# Patient Record
Sex: Male | Born: 1940 | Race: White | Hispanic: No | Smoking: Never smoker
Health system: Southern US, Community
[De-identification: ages and names within clinical notes are randomized; demographics above are authoritative.]

## PROBLEM LIST (undated history)

## (undated) DIAGNOSIS — Z9889 Other specified postprocedural states: Secondary | ICD-10-CM

## (undated) DIAGNOSIS — T8859XA Other complications of anesthesia, initial encounter: Secondary | ICD-10-CM

## (undated) DIAGNOSIS — M545 Low back pain, unspecified: Secondary | ICD-10-CM

## (undated) DIAGNOSIS — M1990 Unspecified osteoarthritis, unspecified site: Secondary | ICD-10-CM

## (undated) DIAGNOSIS — G8929 Other chronic pain: Secondary | ICD-10-CM

## (undated) DIAGNOSIS — E785 Hyperlipidemia, unspecified: Secondary | ICD-10-CM

## (undated) DIAGNOSIS — C4A30 Merkel cell carcinoma of unspecified part of face: Secondary | ICD-10-CM

## (undated) DIAGNOSIS — T4145XA Adverse effect of unspecified anesthetic, initial encounter: Secondary | ICD-10-CM

## (undated) DIAGNOSIS — R112 Nausea with vomiting, unspecified: Secondary | ICD-10-CM

## (undated) DIAGNOSIS — Z952 Presence of prosthetic heart valve: Secondary | ICD-10-CM

## (undated) DIAGNOSIS — I10 Essential (primary) hypertension: Secondary | ICD-10-CM

## (undated) DIAGNOSIS — E279 Disorder of adrenal gland, unspecified: Secondary | ICD-10-CM

## (undated) DIAGNOSIS — K219 Gastro-esophageal reflux disease without esophagitis: Secondary | ICD-10-CM

## (undated) DIAGNOSIS — C801 Malignant (primary) neoplasm, unspecified: Secondary | ICD-10-CM

## (undated) HISTORY — PX: VASECTOMY: SHX75

## (undated) HISTORY — DX: Low back pain, unspecified: M54.50

## (undated) HISTORY — DX: Other chronic pain: G89.29

## (undated) HISTORY — PX: TONSILLECTOMY: SUR1361

## (undated) HISTORY — DX: Low back pain: M54.5

## (undated) HISTORY — PX: ESOPHAGOGASTRODUODENOSCOPY: SHX1529

## (undated) HISTORY — PX: KNEE ARTHROSCOPY: SUR90

---

## 2001-02-11 ENCOUNTER — Ambulatory Visit (HOSPITAL_COMMUNITY): Admission: RE | Admit: 2001-02-11 | Discharge: 2001-02-11 | Payer: Self-pay | Admitting: Family Medicine

## 2001-02-11 ENCOUNTER — Encounter: Payer: Self-pay | Admitting: Family Medicine

## 2001-04-13 ENCOUNTER — Ambulatory Visit (HOSPITAL_COMMUNITY): Admission: RE | Admit: 2001-04-13 | Discharge: 2001-04-13 | Payer: Self-pay | Admitting: Family Medicine

## 2001-04-13 ENCOUNTER — Encounter: Payer: Self-pay | Admitting: Family Medicine

## 2001-05-19 ENCOUNTER — Encounter (HOSPITAL_COMMUNITY): Admission: RE | Admit: 2001-05-19 | Discharge: 2001-06-18 | Payer: Self-pay | Admitting: *Deleted

## 2002-09-07 ENCOUNTER — Encounter: Payer: Self-pay | Admitting: Family Medicine

## 2002-09-07 ENCOUNTER — Ambulatory Visit (HOSPITAL_COMMUNITY): Admission: RE | Admit: 2002-09-07 | Discharge: 2002-09-07 | Payer: Self-pay | Admitting: Family Medicine

## 2004-11-30 HISTORY — PX: CATARACT EXTRACTION: SUR2

## 2005-04-18 ENCOUNTER — Ambulatory Visit (HOSPITAL_COMMUNITY): Admission: RE | Admit: 2005-04-18 | Discharge: 2005-04-18 | Payer: Self-pay | Admitting: Family Medicine

## 2008-03-14 ENCOUNTER — Ambulatory Visit (HOSPITAL_COMMUNITY): Admission: RE | Admit: 2008-03-14 | Discharge: 2008-03-14 | Payer: Self-pay | Admitting: Family Medicine

## 2009-05-17 ENCOUNTER — Ambulatory Visit (HOSPITAL_COMMUNITY): Admission: RE | Admit: 2009-05-17 | Discharge: 2009-05-17 | Payer: Self-pay | Admitting: Family Medicine

## 2011-11-09 ENCOUNTER — Emergency Department (HOSPITAL_COMMUNITY): Payer: Medicare Other

## 2011-11-09 ENCOUNTER — Inpatient Hospital Stay (HOSPITAL_COMMUNITY)
Admission: EM | Admit: 2011-11-09 | Discharge: 2011-11-13 | DRG: 194 | Disposition: A | Discharge status: Home / Self Care | Payer: Medicare Other | Attending: Internal Medicine | Admitting: Internal Medicine

## 2011-11-09 ENCOUNTER — Encounter (HOSPITAL_COMMUNITY): Payer: Self-pay | Admitting: Emergency Medicine

## 2011-11-09 DIAGNOSIS — E119 Type 2 diabetes mellitus without complications: Secondary | ICD-10-CM

## 2011-11-09 DIAGNOSIS — Z794 Long term (current) use of insulin: Secondary | ICD-10-CM

## 2011-11-09 DIAGNOSIS — E876 Hypokalemia: Secondary | ICD-10-CM

## 2011-11-09 DIAGNOSIS — J189 Pneumonia, unspecified organism: Principal | ICD-10-CM

## 2011-11-09 DIAGNOSIS — R0602 Shortness of breath: Secondary | ICD-10-CM

## 2011-11-09 DIAGNOSIS — E871 Hypo-osmolality and hyponatremia: Secondary | ICD-10-CM

## 2011-11-09 DIAGNOSIS — E279 Disorder of adrenal gland, unspecified: Secondary | ICD-10-CM

## 2011-11-09 DIAGNOSIS — E278 Other specified disorders of adrenal gland: Secondary | ICD-10-CM | POA: Diagnosis present

## 2011-11-09 DIAGNOSIS — F17200 Nicotine dependence, unspecified, uncomplicated: Secondary | ICD-10-CM | POA: Diagnosis present

## 2011-11-09 DIAGNOSIS — N179 Acute kidney failure, unspecified: Secondary | ICD-10-CM | POA: Diagnosis present

## 2011-11-09 DIAGNOSIS — J9600 Acute respiratory failure, unspecified whether with hypoxia or hypercapnia: Secondary | ICD-10-CM | POA: Diagnosis present

## 2011-11-09 DIAGNOSIS — E118 Type 2 diabetes mellitus with unspecified complications: Secondary | ICD-10-CM | POA: Diagnosis present

## 2011-11-09 DIAGNOSIS — Z79899 Other long term (current) drug therapy: Secondary | ICD-10-CM

## 2011-11-09 DIAGNOSIS — R091 Pleurisy: Secondary | ICD-10-CM | POA: Diagnosis present

## 2011-11-09 DIAGNOSIS — R0902 Hypoxemia: Secondary | ICD-10-CM

## 2011-11-09 HISTORY — DX: Hyperlipidemia, unspecified: E78.5

## 2011-11-09 HISTORY — DX: Disorder of adrenal gland, unspecified: E27.9

## 2011-11-09 MED ORDER — SODIUM CHLORIDE 0.9 % IV BOLUS (SEPSIS)
500.0000 mL | Freq: Once | INTRAVENOUS | Status: AC
  Administered 2011-11-10: 500 mL via INTRAVENOUS

## 2011-11-09 MED ORDER — ONDANSETRON HCL 4 MG/2ML IJ SOLN
4.0000 mg | Freq: Once | INTRAMUSCULAR | Status: AC
  Administered 2011-11-10: 4 mg via INTRAVENOUS
  Filled 2011-11-09: qty 2

## 2011-11-09 MED ORDER — SODIUM CHLORIDE 0.9 % IV SOLN
INTRAVENOUS | Status: DC
  Administered 2011-11-10: via INTRAVENOUS

## 2011-11-09 NOTE — ED Provider Notes (Signed)
History  This chart was scribed for Shelda Jakes, MD by Cherlynn Perches. The patient was seen in room APA12/APA12. Patient's care was started at 2329.  CSN: [id number]  Arrival date & time 11/09/11  2329   First MD Initiated Contact with Patient 11/09/11 2343      Chief Complaint  Patient presents with  . Respiratory Distress    (Consider location/radiation/quality/duration/timing/severity/associated sxs/prior treatment) The history is provided by the patient. No language interpreter was used.    KADEEN SROKA is a 71 y.o. male with a h/o who presents to the Emergency Department complaining of 3 hours of sudden onset, moderate shortness of breath with associated chills, cough, nausea, and vomiting. Pt states that SOB began while lying down in bed. Pt vomited for the first time during physical exam. Pt reports that his SOB is moderately improved with 2L O2 in ED, but is not back to normal. Pt denies chest pain, fever, sore throat, congestion, abdominal pain, vomiting, diarrhea, rash, neck pain, back pain, visual disturbances, dysuria, and hematuria.   PCP - Dr. Lilyan Punt  Past Medical History  Diagnosis Date  . Diabetes mellitus     Past Surgical History  Procedure Date  . Knee surgery     History reviewed. No pertinent family history.  History  Substance Use Topics  . Smoking status: Never Smoker   . Smokeless tobacco: Not on file  . Alcohol Use: No      Review of Systems  Constitutional: Positive for chills. Negative for fever.  HENT: Negative for ear pain, congestion, sore throat and neck pain.   Eyes: Negative for visual disturbance.  Respiratory: Positive for cough and shortness of breath.   Cardiovascular: Negative for chest pain.  Gastrointestinal: Positive for nausea and vomiting. Negative for abdominal pain and diarrhea.  Genitourinary: Negative for dysuria and hematuria.  Musculoskeletal: Negative for back pain.  Skin: Negative for rash.    Neurological: Negative for headaches.  All other systems reviewed and are negative.    Allergies  Review of patient's allergies indicates no known allergies.  Home Medications   Current Outpatient Rx  Name Route Sig Dispense Refill  . INSULIN GLARGINE 100 UNIT/ML [place] SOLN Subcutaneous Inject 18 Units into the skin at bedtime.      Pulse 118  Temp(Src) 98.3 F (36.8 C) (Oral)  Ht 6\' 1"  (1.854 m)  Wt 165 lb (74.844 kg)  BMI 21.77 kg/m2  SpO2 93%  Physical Exam  Nursing note and vitals reviewed. Constitutional: He is oriented to person, place, and time. He appears well-developed and well-nourished.  HENT:  Head: Normocephalic and atraumatic.  Eyes: Conjunctivae and EOM are normal.  Neck: Neck supple.  Cardiovascular: Regular rhythm.   No murmur heard.      Mild tachycardia  Pulmonary/Chest: He has rales (rales bilaterally, worse on right side).  Abdominal: Soft. Bowel sounds are normal. There is no tenderness.  Musculoskeletal: Normal range of motion. He exhibits no edema.  Lymphadenopathy:    He has no cervical adenopathy.  Neurological: He is alert and oriented to person, place, and time.  Skin: Skin is warm and dry.  Psychiatric: He has a normal mood and affect. His behavior is normal.    ED Course  Procedures (including critical care time)  DIAGNOSTIC STUDIES: Oxygen Saturation is 90% on room air, low by my interpretation.  Oxygen Saturation is 94% on 2L O2, adequate by my interpretation.   COORDINATION OF CARE: 11:52PM - Patient and Family understand  and agree with initial ED impression and plan with expectations set for ED visit.   Date: 11/10/2011  Rate: 107  Rhythm: sinus tachycardia  QRS Axis: normal  Intervals: normal  ST/T Wave abnormalities: nonspecific ST/T changes  Conduction Disutrbances:none  Narrative Interpretation:   Old EKG Reviewed: none available    Labs Reviewed  CBC - Abnormal; Notable for the following:    WBC 11.4 (*)    All  other components within normal limits  DIFFERENTIAL - Abnormal; Notable for the following:    Neutrophils Relative 85 (*)    Neutro Abs 9.7 (*)    Lymphocytes Relative 10 (*)    All other components within normal limits  COMPREHENSIVE METABOLIC PANEL - Abnormal; Notable for the following:    Sodium 133 (*)    Potassium 3.4 (*)    Glucose, Bld 187 (*)    GFR calc non Af Amer 66 (*)    GFR calc Af Amer 76 (*)    All other components within normal limits  URINALYSIS, ROUTINE W REFLEX MICROSCOPIC - Abnormal; Notable for the following:    Hgb urine dipstick TRACE (*)    All other components within normal limits  GLUCOSE, CAPILLARY - Abnormal; Notable for the following:    Glucose-Capillary 186 (*)    All other components within normal limits  BLOOD GAS, ARTERIAL - Abnormal; Notable for the following:    pCO2 arterial 33.2 (*)    pO2, Arterial 68.0 (*)    Acid-base deficit 2.2 (*)    All other components within normal limits  TROPONIN I  LACTIC ACID, PLASMA  PRO B NATRIURETIC PEPTIDE  URINE MICROSCOPIC-ADD ON  CULTURE, BLOOD (ROUTINE X 2)  CULTURE, BLOOD (ROUTINE X 2)   Ct Angio Chest W/cm &/or Wo Cm  11/10/2011  *RADIOLOGY REPORT*  Clinical Data: Respiratory distress  CT ANGIOGRAPHY CHEST  Technique:  Multidetector CT imaging of the chest using the standard protocol during bolus administration of intravenous contrast. Multiplanar reconstructed images including MIPs were obtained and reviewed to evaluate the vascular anatomy.  Contrast: OMNIPAQUE IOHEXOL 350 MG/ML SOLN  Comparison: 11/09/2011 radiograph  Findings: Lower lobe evaluation is degraded by respiratory motion. Allowing for this, no pulmonary arterial branch filling defect is identified.  Scattered atherosclerosis of the aorta and branch vessels.  Heart size upper normal limits.  No pleural or pericardial effusion.  Bibasilar opacities.  No pneumothorax.  Small amount of debris within the right main bronchus.  Peribronchial  thickening and reactive sized hilar and mediastinal lymph nodes.  Limited images of the upper abdomen show nonspecific perinephric fat stranding. Incompletely characterized right adrenal nodule measures 1.4 cm.  No acute osseous finding.  IMPRESSION: Evaluation of lower lobe segmental branches is degraded by respiratory motion.  Allowing for this, no pulmonary arterial branch filling defect identified.  Bibasilar opacities; pneumonia versus aspiration.  Nonspecific perinephric fat stranding.  Correlate with clinical symptoms/urinalysis if concerned for pyelonephritis.  1.4 cm right adrenal nodule is incompletely characterized. Recommend adrenal protocol CT or MRI follow-up.  Original Report Authenticated By: Waneta Martins, M.D.   Dg Chest Portable 1 View  11/10/2011  *RADIOLOGY REPORT*  Clinical Data: Shortness of breath  PORTABLE CHEST - 1 VIEW  Comparison: 04/18/2005  Findings: Degraded by hypoaeration.  Bibasilar opacities.  Central vascular congestion.  Underlying edema not excluded.  Aortic arch atherosclerosis.  No pneumothorax. Pleural effusions not excluded. No acute osseous finding.  IMPRESSION: Degraded by hypoaeration.  Bibasilar opacities; atelectasis versus pneumonia.  Original Report Authenticated By: Waneta Martins, M.D.     1. CAP (community acquired pneumonia)   2. Shortness of breath   3. Hypoxia       MDM  Patient with acute onset shortness of breath exactly at 9:00 followed by some nausea and vomiting workup extensive to include CT angios to evaluate hypoxia most likely what's going on is a bilateral pneumonic process community-acquired pneumonia patient early on was treated with Zosyn for potential pre-sepsis however lactate acid level was not significant no evidence of acute cardiac event no evidence of pulmonary embolism no evidence of congestive heart failure. Most likely dealing with a community-acquired pneumonic process such as starting therefore went ahead and  added the IV Rocephin and IV Zithromax blood cultures percent earlier. Urinalysis without evidence of urinary tract infection dural blood gas confirmed the hypoxia room air sat was 90% upon arrival patient has actually while in the ED not gotten worse at all blood pressure has remained normal heart rates persistently in the low tachycardic range of 100-120. Blood pressure most recently systolic 1:30 patient's never been hypotensive. Discussed with the hospitalist to admit for Dr. Gerda Diss, they will come down and evaluate the patient in the emergency department and determine whether telemetry or step down admission is required.     I personally performed the services described in this documentation, which was scribed in my presence. The recorded information has been reviewed and considered.     Shelda Jakes, MD 11/10/11 [phone]

## 2011-11-09 NOTE — ED Notes (Addendum)
Patient complaining of shortness of breath starting approximately 2100 tonight. Denies chest pain. Breathing labored.

## 2011-11-10 ENCOUNTER — Encounter (HOSPITAL_COMMUNITY): Payer: Self-pay | Admitting: Internal Medicine

## 2011-11-10 ENCOUNTER — Emergency Department (HOSPITAL_COMMUNITY): Payer: Medicare Other

## 2011-11-10 DIAGNOSIS — E278 Other specified disorders of adrenal gland: Secondary | ICD-10-CM

## 2011-11-10 DIAGNOSIS — D696 Thrombocytopenia, unspecified: Secondary | ICD-10-CM

## 2011-11-10 DIAGNOSIS — J9600 Acute respiratory failure, unspecified whether with hypoxia or hypercapnia: Secondary | ICD-10-CM | POA: Diagnosis present

## 2011-11-10 DIAGNOSIS — E871 Hypo-osmolality and hyponatremia: Secondary | ICD-10-CM | POA: Diagnosis present

## 2011-11-10 DIAGNOSIS — E876 Hypokalemia: Secondary | ICD-10-CM | POA: Diagnosis present

## 2011-11-10 DIAGNOSIS — J189 Pneumonia, unspecified organism: Secondary | ICD-10-CM | POA: Diagnosis present

## 2011-11-10 DIAGNOSIS — E118 Type 2 diabetes mellitus with unspecified complications: Secondary | ICD-10-CM | POA: Diagnosis present

## 2011-11-10 DIAGNOSIS — E279 Disorder of adrenal gland, unspecified: Secondary | ICD-10-CM

## 2011-11-10 DIAGNOSIS — R112 Nausea with vomiting, unspecified: Secondary | ICD-10-CM

## 2011-11-10 DIAGNOSIS — E1165 Type 2 diabetes mellitus with hyperglycemia: Secondary | ICD-10-CM

## 2011-11-10 HISTORY — DX: Disorder of adrenal gland, unspecified: E27.9

## 2011-11-10 HISTORY — DX: Other specified disorders of adrenal gland: E27.8

## 2011-11-10 LAB — CBC
MCH: 29.8 pg (ref 26.0–34.0)
MCHC: 34.6 g/dL (ref 30.0–36.0)
MCV: 86.2 fL (ref 78.0–100.0)
Platelets: 209 10*3/uL (ref 150–400)
RDW: 13.2 % (ref 11.5–15.5)

## 2011-11-10 LAB — BLOOD GAS, ARTERIAL
Acid-base deficit: 2.2 mmol/L — ABNORMAL HIGH (ref 0.0–2.0)
Bicarbonate: 21.5 mEq/L (ref 20.0–24.0)
O2 Saturation: 93.9 %
Patient temperature: 37
TCO2: 18.6 mmol/L (ref 0–100)

## 2011-11-10 LAB — COMPREHENSIVE METABOLIC PANEL
ALT: 22 U/L (ref 0–53)
Albumin: 3.9 g/dL (ref 3.5–5.2)
Alkaline Phosphatase: 65 U/L (ref 39–117)
Calcium: 9.6 mg/dL (ref 8.4–10.5)
GFR calc Af Amer: 76 mL/min — ABNORMAL LOW (ref >90)
Potassium: 3.4 mEq/L — ABNORMAL LOW (ref 3.5–5.1)
Sodium: 133 mEq/L — ABNORMAL LOW (ref 135–145)
Total Protein: 7.4 g/dL (ref 6.0–8.3)

## 2011-11-10 LAB — DIFFERENTIAL
Basophils Relative: 0 % (ref 0–1)
Eosinophils Absolute: 0.1 10*3/uL (ref 0.0–0.7)
Eosinophils Relative: 1 % (ref 0–5)
Neutrophils Relative %: 85 % — ABNORMAL HIGH (ref 43–77)

## 2011-11-10 LAB — URINALYSIS, ROUTINE W REFLEX MICROSCOPIC
Bilirubin Urine: NEGATIVE
Glucose, UA: NEGATIVE mg/dL
Ketones, ur: NEGATIVE mg/dL
Leukocytes, UA: NEGATIVE
Nitrite: NEGATIVE
Specific Gravity, Urine: 1.025 (ref 1.005–1.030)
pH: 5.5 (ref 5.0–8.0)

## 2011-11-10 LAB — GLUCOSE, CAPILLARY
Glucose-Capillary: 112 mg/dL — ABNORMAL HIGH (ref 70–99)
Glucose-Capillary: 186 mg/dL — ABNORMAL HIGH (ref 70–99)
Glucose-Capillary: 195 mg/dL — ABNORMAL HIGH (ref 70–99)

## 2011-11-10 LAB — PRO B NATRIURETIC PEPTIDE: Pro B Natriuretic peptide (BNP): 37 pg/mL (ref 0–125)

## 2011-11-10 LAB — URINE MICROSCOPIC-ADD ON

## 2011-11-10 MED ORDER — SODIUM CHLORIDE 0.9 % IV SOLN: INTRAVENOUS | Status: AC

## 2011-11-10 MED ORDER — POTASSIUM CHLORIDE CRYS ER 20 MEQ PO TBCR
20.0000 meq | EXTENDED_RELEASE_TABLET | Freq: Every day | ORAL | Status: AC
  Administered 2011-11-11: 20 meq via ORAL
  Filled 2011-11-10: qty 1

## 2011-11-10 MED ORDER — LEVALBUTEROL HCL 0.63 MG/3ML IN NEBU
0.6300 mg | INHALATION_SOLUTION | Freq: Four times a day (QID) | RESPIRATORY_TRACT | Status: DC
  Administered 2011-11-10 – 2011-11-12 (×11): 0.63 mg via RESPIRATORY_TRACT
  Filled 2011-11-10 (×11): qty 3

## 2011-11-10 MED ORDER — DEXTROSE 5 % IV SOLN
1.0000 g | Freq: Once | INTRAVENOUS | Status: AC
  Administered 2011-11-10: 1 g via INTRAVENOUS

## 2011-11-10 MED ORDER — INSULIN ASPART 100 UNIT/ML ~~LOC~~ SOLN
0.0000 [IU] | Freq: Three times a day (TID) | SUBCUTANEOUS | Status: DC
  Administered 2011-11-10: 3 [IU] via SUBCUTANEOUS
  Administered 2011-11-10 – 2011-11-12 (×4): 2 [IU] via SUBCUTANEOUS
  Administered 2011-11-12 – 2011-11-13 (×3): 3 [IU] via SUBCUTANEOUS

## 2011-11-10 MED ORDER — ACARBOSE 50 MG PO TABS
50.0000 mg | ORAL_TABLET | Freq: Three times a day (TID) | ORAL | Status: DC
  Administered 2011-11-10 – 2011-11-12 (×7): 50 mg via ORAL
  Filled 2011-11-10 (×14): qty 1

## 2011-11-10 MED ORDER — DEXTROSE 5 % IV SOLN
1.0000 g | INTRAVENOUS | Status: DC
  Administered 2011-11-11 – 2011-11-13 (×3): 1 g via INTRAVENOUS
  Filled 2011-11-10 (×4): qty 10

## 2011-11-10 MED ORDER — IOHEXOL 350 MG/ML SOLN
100.0000 mL | Freq: Once | INTRAVENOUS | Status: AC | PRN
  Administered 2011-11-10: 100 mL via INTRAVENOUS

## 2011-11-10 MED ORDER — SIMVASTATIN 10 MG PO TABS
10.0000 mg | ORAL_TABLET | Freq: Every day | ORAL | Status: DC
  Administered 2011-11-10 – 2011-11-12 (×3): 10 mg via ORAL
  Filled 2011-11-10 (×3): qty 1

## 2011-11-10 MED ORDER — ONDANSETRON HCL 4 MG PO TABS: 4.0000 mg | ORAL_TABLET | Freq: Four times a day (QID) | ORAL | Status: DC | PRN

## 2011-11-10 MED ORDER — ACETAMINOPHEN 650 MG RE SUPP: 650.0000 mg | Freq: Four times a day (QID) | RECTAL | Status: DC | PRN

## 2011-11-10 MED ORDER — POTASSIUM CHLORIDE CRYS ER 20 MEQ PO TBCR
40.0000 meq | EXTENDED_RELEASE_TABLET | Freq: Once | ORAL | Status: AC
  Administered 2011-11-10: 40 meq via ORAL
  Filled 2011-11-10: qty 2

## 2011-11-10 MED ORDER — OXYCODONE HCL 5 MG PO TABS
5.0000 mg | ORAL_TABLET | ORAL | Status: DC | PRN
Start: 2011-11-10 — End: 2011-11-13
  Administered 2011-11-10 – 2011-11-12 (×3): 5 mg via ORAL
  Filled 2011-11-10 (×3): qty 1

## 2011-11-10 MED ORDER — LEVALBUTEROL HCL 0.63 MG/3ML IN NEBU: 0.6300 mg | INHALATION_SOLUTION | RESPIRATORY_TRACT | Status: DC | PRN

## 2011-11-10 MED ORDER — DEXTROSE 5 % IV SOLN
500.0000 mg | INTRAVENOUS | Status: DC
  Administered 2011-11-11 – 2011-11-13 (×3): 500 mg via INTRAVENOUS
  Filled 2011-11-10 (×4): qty 500

## 2011-11-10 MED ORDER — PIPERACILLIN-TAZOBACTAM 3.375 G IVPB
3.3750 g | Freq: Once | INTRAVENOUS | Status: AC
  Administered 2011-11-10: 3.375 g via INTRAVENOUS
  Filled 2011-11-10: qty 50

## 2011-11-10 MED ORDER — DEXTROSE 5 % IV SOLN
500.0000 mg | INTRAVENOUS | Status: DC
  Administered 2011-11-10: 500 mg via INTRAVENOUS
  Filled 2011-11-10: qty 500

## 2011-11-10 MED ORDER — ACETAMINOPHEN 325 MG PO TABS
650.0000 mg | ORAL_TABLET | Freq: Four times a day (QID) | ORAL | Status: DC | PRN
  Administered 2011-11-10: 650 mg via ORAL
  Filled 2011-11-10: qty 2

## 2011-11-10 MED ORDER — ONDANSETRON HCL 4 MG/2ML IJ SOLN
4.0000 mg | Freq: Four times a day (QID) | INTRAMUSCULAR | Status: DC | PRN
  Administered 2011-11-11 – 2011-11-12 (×2): 4 mg via INTRAVENOUS
  Filled 2011-11-10 (×2): qty 2

## 2011-11-10 MED ORDER — ALBUTEROL SULFATE (5 MG/ML) 0.5% IN NEBU
INHALATION_SOLUTION | RESPIRATORY_TRACT | Status: AC
  Administered 2011-11-10: 5 mg
  Filled 2011-11-10: qty 1

## 2011-11-10 MED ORDER — INSULIN ASPART 100 UNIT/ML ~~LOC~~ SOLN
0.0000 [IU] | Freq: Every day | SUBCUTANEOUS | Status: DC
  Administered 2011-11-10: 2 [IU] via SUBCUTANEOUS

## 2011-11-10 MED ORDER — ENOXAPARIN SODIUM 40 MG/0.4ML ~~LOC~~ SOLN
40.0000 mg | SUBCUTANEOUS | Status: DC
  Administered 2011-11-10 – 2011-11-13 (×4): 40 mg via SUBCUTANEOUS
  Filled 2011-11-10 (×4): qty 0.4

## 2011-11-10 MED ORDER — INSULIN GLARGINE 100 UNIT/ML ~~LOC~~ SOLN
18.0000 [IU] | Freq: Every day | SUBCUTANEOUS | Status: DC
  Administered 2011-11-10 – 2011-11-12 (×3): 18 [IU] via SUBCUTANEOUS

## 2011-11-10 MED ORDER — IPRATROPIUM BROMIDE 0.02 % IN SOLN
0.5000 mg | Freq: Four times a day (QID) | RESPIRATORY_TRACT | Status: DC
  Administered 2011-11-10 – 2011-11-11 (×5): 0.5 mg via RESPIRATORY_TRACT
  Filled 2011-11-10 (×5): qty 2.5

## 2011-11-10 NOTE — Consult Note (Signed)
ANTIBIOTIC CONSULT NOTE - INITIAL  Pharmacy Consult for Zithromax and Rocephin Indication: rule out pneumonia  No Known Allergies  Patient Measurements: Height: 6\' 1"  (185.4 cm) Weight: 195 lb (88.451 kg) IBW/kg (Calculated) : 79.9   Vital Signs: Temp: 98.4 F (36.9 C) (06/10 0553) Temp src: Oral (06/10 0553) BP: 120/61 mmHg (06/10 0553) Pulse Rate: 110  (06/10 0553) Intake/Output from previous day:   Intake/Output from this shift:    Labs:  Basename 11/10/11 0009  WBC 11.4*  HGB 15.1  PLT 209  LABCREA --  CREATININE 1.09   Estimated Creatinine Clearance: 69.2 ml/min (by C-G formula based on Cr of 1.09). No results found for this basename: VANCOTROUGH:2,VANCOPEAK:2,VANCORANDOM:2,GENTTROUGH:2,GENTPEAK:2,GENTRANDOM:2,TOBRATROUGH:2,TOBRAPEAK:2,TOBRARND:2,AMIKACINPEAK:2,AMIKACINTROU:2,AMIKACIN:2, in the last 72 hours   Microbiology: Recent Results (from the past 720 hour(s))  CULTURE, BLOOD (ROUTINE X 2)     Status: Normal (Preliminary result)   Collection Time   11/10/11 12:32 AM      Component Value Range Status Comment   Specimen Description Blood BLOOD LEFT ARM   Final    Special Requests BOTTLES DRAWN AEROBIC AND ANAEROBIC 6CC   Final    Culture PENDING   Incomplete    Report Status PENDING   Incomplete   CULTURE, BLOOD (ROUTINE X 2)     Status: Normal (Preliminary result)   Collection Time   11/10/11  1:08 AM      Component Value Range Status Comment   Specimen Description Blood BLOOD LEFT HAND   Final    Special Requests BOTTLES DRAWN AEROBIC AND ANAEROBIC 8CC   Final    Culture PENDING   Incomplete    Report Status PENDING   Incomplete     Medical History: Past Medical History  Diagnosis Date  . Diabetes mellitus   . Hyperlipemia   . Adrenal nodule 11/10/2011    Medications:  Scheduled:    . acarbose  50 mg Oral TID WC  . albuterol      . azithromycin  500 mg Intravenous Q24H  . cefTRIAXone (ROCEPHIN) IVPB 1 gram/50 mL D5W  1 g Intravenous Once    . cefTRIAXone (ROCEPHIN)  IV  1 g Intravenous Q24H  . enoxaparin  40 mg Subcutaneous Q24H  . insulin aspart  0-15 Units Subcutaneous TID WC  . insulin aspart  0-5 Units Subcutaneous QHS  . insulin glargine  18 Units Subcutaneous QHS  . ipratropium  0.5 mg Nebulization Q6H  . levalbuterol  0.63 mg Nebulization Q6H  . ondansetron  4 mg Intravenous Once  . piperacillin-tazobactam  3.375 g Intravenous Once  . potassium chloride  40 mEq Oral Once  . simvastatin  10 mg Oral q1800  . sodium chloride  500 mL Intravenous Once  . DISCONTD: azithromycin  500 mg Intravenous Q24H   Assessment: Good renal fxn  Goal of Therapy:  Eradicate infection.  Plan:  No adjustment needed, will follow at a distance  [address], [place] A 11/10/2011,10:39 AM

## 2011-11-10 NOTE — Progress Notes (Signed)
The patient is a 71 year old man with a history significant for diabetes mellitus, who was admitted this morning for diagnosis and treatment of pneumonia. He was briefly seen. His chart, laboratory data, and vital signs were reviewed. We'll continue Rocephin and azithromycin. We'll continue supportive treatment. We'll continue to replete his potassium orally.

## 2011-11-10 NOTE — H&P (Signed)
Jesse Cordova is an 70 y.o. male.    PCP: Lilyan Punt, MD, MD   Chief Complaint: Shortness of breath  HPI: This is a 71 year old, Caucasian male, with past medical history of, diabetes, hypercholesterolemia, who was in his usual state of health till last night when at around 8:30 or so he started having a dry cough. Subsequently, around 10:00 last night he started becoming short of breath. Denies any chest pain. He says his wife was sick with respiratory infection last week and required antibiotics. He's had multiple episodes of vomiting overnight. Denies any abdominal pain, or diarrhea. Denies any fever, although he did feel hot. Denies any significant leg swelling. He received nebulizer treatments in the ED, and is feeling better. He is accompanied by his daughter.   Home Medications: Prior to Admission medications   Medication Sig Start Date End Date Taking? Authorizing Provider  acarbose (PRECOSE) 50 MG tablet Take 50 mg by mouth 3 (three) times daily with meals.   Yes Historical Provider, MD  glyBURIDE micronized (GLYNASE) 6 MG tablet Take 6 mg by mouth 2 (two) times daily before a meal.   Yes Historical Provider, MD  insulin glargine (LANTUS) 100 UNIT/ML injection Inject 18 Units into the skin at bedtime.   Yes Historical Provider, MD  lovastatin (MEVACOR) 20 MG tablet Take 20 mg by mouth at bedtime.   Yes Historical Provider, MD    Allergies: No Known Allergies  Past Medical History: Past Medical History  Diagnosis Date  . Diabetes mellitus     Past Surgical History  Procedure Date  . Vasectomy   . Knee arthroscopy     Social History:  reports that he has been passively smoking Cigarettes.  He does not have any smokeless tobacco history on file. He reports that he does not drink alcohol or use illicit drugs.  Family History: There is history of, diabetes, and heart disease in the family.  Review of Systems - History obtained from the patient General ROS: positive for   - fatigue Psychological ROS: negative Ophthalmic ROS: negative ENT ROS: negative Allergy and Immunology ROS: negative Hematological and Lymphatic ROS: negative Endocrine ROS: negative Respiratory ROS: as in hpi Cardiovascular ROS: negative Gastrointestinal ROS: as in hpi Genito-Urinary ROS: negative Musculoskeletal ROS: negative Neurological ROS: no TIA or stroke symptoms Dermatological ROS: negative  Physical Examination Pulse 118, temperature 98.3 F (36.8 C), temperature source Oral, height 6\' 1"  (1.854 m), weight 74.844 kg (165 lb), SpO2 93.00%.  General appearance: alert, cooperative, appears stated age and no distress Head: Normocephalic, without obvious abnormality, atraumatic Eyes: conjunctivae/corneas clear. PERRL, EOM's intact.  Throat: lips, mucosa, and tongue normal; teeth and gums normal Neck: no adenopathy, no carotid bruit, no JVD, supple, symmetrical, trachea midline and thyroid not enlarged, symmetric, no tenderness/mass/nodules Back: symmetric, no curvature. ROM normal. No CVA tenderness. Resp: Crackles are present in both bases. No wheezing. A few rhonchi. Cardio: S1, S2 tachycardic, regular, no murmur, click, rub or gallop GI: soft, non-tender; bowel sounds normal; no masses,  no organomegaly Extremities: extremities normal, atraumatic, no cyanosis or edema Pulses: 2+ and symmetric Skin: Skin color, texture, turgor normal. No rashes or lesions Lymph nodes: Cervical, supraclavicular, and axillary nodes normal. Neurologic: Grossly normal  Laboratory Data: Results for orders placed during the hospital encounter of 11/09/11 (from the past 48 hour(s))  GLUCOSE, CAPILLARY     Status: Abnormal   Collection Time   11/10/11 12:07 AM      Component Value Range Comment  Glucose-Capillary 186 (*) 70 - 99 (mg/dL)   TROPONIN I     Status: Normal   Collection Time   11/10/11 12:09 AM      Component Value Range Comment   Troponin I <0.30  <0.30 (ng/mL)   CBC      Status: Abnormal   Collection Time   11/10/11 12:09 AM      Component Value Range Comment   WBC 11.4 (*) 4.0 - 10.5 (K/uL)    RBC 5.07  4.22 - 5.81 (MIL/uL)    Hemoglobin 15.1  13.0 - 17.0 (g/dL)    HCT 09.8  11.9 - 14.7 (%)    MCV 86.2  78.0 - 100.0 (fL)    MCH 29.8  26.0 - 34.0 (pg)    MCHC 34.6  30.0 - 36.0 (g/dL)    RDW 82.9  56.2 - 13.0 (%)    Platelets 209  150 - 400 (K/uL)   DIFFERENTIAL     Status: Abnormal   Collection Time   11/10/11 12:09 AM      Component Value Range Comment   Neutrophils Relative 85 (*) 43 - 77 (%)    Neutro Abs 9.7 (*) 1.7 - 7.7 (K/uL)    Lymphocytes Relative 10 (*) 12 - 46 (%)    Lymphs Abs 1.2  0.7 - 4.0 (K/uL)    Monocytes Relative 4  3 - 12 (%)    Monocytes Absolute 0.5  0.1 - 1.0 (K/uL)    Eosinophils Relative 1  0 - 5 (%)    Eosinophils Absolute 0.1  0.0 - 0.7 (K/uL)    Basophils Relative 0  0 - 1 (%)    Basophils Absolute 0.0  0.0 - 0.1 (K/uL)   COMPREHENSIVE METABOLIC PANEL     Status: Abnormal   Collection Time   11/10/11 12:09 AM      Component Value Range Comment   Sodium 133 (*) 135 - 145 (mEq/L)    Potassium 3.4 (*) 3.5 - 5.1 (mEq/L)    Chloride 98  96 - 112 (mEq/L)    CO2 22  19 - 32 (mEq/L)    Glucose, Bld 187 (*) 70 - 99 (mg/dL)    BUN 18  6 - 23 (mg/dL)    Creatinine, Ser 8.65  0.50 - 1.35 (mg/dL)    Calcium 9.6  8.4 - 10.5 (mg/dL)    Total Protein 7.4  6.0 - 8.3 (g/dL)    Albumin 3.9  3.5 - 5.2 (g/dL)    AST 21  0 - 37 (U/L)    ALT 22  0 - 53 (U/L)    Alkaline Phosphatase 65  39 - 117 (U/L)    Total Bilirubin 0.6  0.3 - 1.2 (mg/dL)    GFR calc non Af Amer 66 (*) >90 (mL/min)    GFR calc Af Amer 76 (*) >90 (mL/min)   PRO B NATRIURETIC PEPTIDE     Status: Normal   Collection Time   11/10/11 12:09 AM      Component Value Range Comment   Pro B Natriuretic peptide (BNP) 37.0  0 - 125 (pg/mL)   LACTIC ACID, PLASMA     Status: Normal   Collection Time   11/10/11 12:32 AM      Component Value Range Comment   Lactic Acid,  Venous 2.0  0.5 - 2.2 (mmol/L)   CULTURE, BLOOD (ROUTINE X 2)     Status: Normal (Preliminary result)   Collection Time   11/10/11 12:32 AM  Component Value Range Comment   Specimen Description Blood BLOOD LEFT ARM      Special Requests BOTTLES DRAWN AEROBIC AND ANAEROBIC 6CC      Culture PENDING      Report Status PENDING     URINALYSIS, ROUTINE W REFLEX MICROSCOPIC     Status: Abnormal   Collection Time   11/10/11 12:40 AM      Component Value Range Comment   Color, Urine YELLOW  YELLOW     APPearance CLEAR  CLEAR     Specific Gravity, Urine 1.025  1.005 - 1.030     pH 5.5  5.0 - 8.0     Glucose, UA NEGATIVE  NEGATIVE (mg/dL)    Hgb urine dipstick TRACE (*) NEGATIVE     Bilirubin Urine NEGATIVE  NEGATIVE     Ketones, ur NEGATIVE  NEGATIVE (mg/dL)    Protein, ur NEGATIVE  NEGATIVE (mg/dL)    Urobilinogen, UA 0.2  0.0 - 1.0 (mg/dL)    Nitrite NEGATIVE  NEGATIVE     Leukocytes, UA NEGATIVE  NEGATIVE    URINE MICROSCOPIC-ADD ON     Status: Normal   Collection Time   11/10/11 12:40 AM      Component Value Range Comment   Squamous Epithelial / LPF RARE  RARE     WBC, UA 0-2  <3 (WBC/hpf)    RBC / HPF 0-2  <3 (RBC/hpf)    Bacteria, UA RARE  RARE    CULTURE, BLOOD (ROUTINE X 2)     Status: Normal (Preliminary result)   Collection Time   11/10/11  1:08 AM      Component Value Range Comment   Specimen Description Blood BLOOD LEFT HAND      Special Requests BOTTLES DRAWN AEROBIC AND ANAEROBIC 8CC      Culture PENDING      Report Status PENDING     BLOOD GAS, ARTERIAL     Status: Abnormal   Collection Time   11/10/11  1:30 AM      Component Value Range Comment   O2 Content 2.0      Delivery systems NASAL CANNULA      pH, Arterial 7.427  7.350 - 7.450     pCO2 arterial 33.2 (*) 35.0 - 45.0 (mmHg)    pO2, Arterial 68.0 (*) 80.0 - 100.0 (mmHg)    Bicarbonate 21.5  20.0 - 24.0 (mEq/L)    TCO2 18.6  0 - 100 (mmol/L)    Acid-base deficit 2.2 (*) 0.0 - 2.0 (mmol/L)    O2 Saturation  93.9      Patient temperature 37.0      Collection site LEFT RADIAL      Drawn by COLLECTED BY RT      Sample type ARTERIAL      Allens test (pass/fail) PASS  PASS      Radiology Reports: Ct Angio Chest W/cm &/or Wo Cm  11/10/2011  *RADIOLOGY REPORT*  Clinical Data: Respiratory distress  CT ANGIOGRAPHY CHEST  Technique:  Multidetector CT imaging of the chest using the standard protocol during bolus administration of intravenous contrast. Multiplanar reconstructed images including MIPs were obtained and reviewed to evaluate the vascular anatomy.  Contrast: OMNIPAQUE IOHEXOL 350 MG/ML SOLN  Comparison: 11/09/2011 radiograph  Findings: Lower lobe evaluation is degraded by respiratory motion. Allowing for this, no pulmonary arterial branch filling defect is identified.  Scattered atherosclerosis of the aorta and branch vessels.  Heart size upper normal limits.  No pleural or  pericardial effusion.  Bibasilar opacities.  No pneumothorax.  Small amount of debris within the right main bronchus.  Peribronchial thickening and reactive sized hilar and mediastinal lymph nodes.  Limited images of the upper abdomen show nonspecific perinephric fat stranding. Incompletely characterized right adrenal nodule measures 1.4 cm.  No acute osseous finding.  IMPRESSION: Evaluation of lower lobe segmental branches is degraded by respiratory motion.  Allowing for this, no pulmonary arterial branch filling defect identified.  Bibasilar opacities; pneumonia versus aspiration.  Nonspecific perinephric fat stranding.  Correlate with clinical symptoms/urinalysis if concerned for pyelonephritis.  1.4 cm right adrenal nodule is incompletely characterized. Recommend adrenal protocol CT or MRI follow-up.  Original Report Authenticated By: Waneta Martins, M.D.   Dg Chest Portable 1 View  11/10/2011  *RADIOLOGY REPORT*  Clinical Data: Shortness of breath  PORTABLE CHEST - 1 VIEW  Comparison: 04/18/2005  Findings: Degraded by  hypoaeration.  Bibasilar opacities.  Central vascular congestion.  Underlying edema not excluded.  Aortic arch atherosclerosis.  No pneumothorax. Pleural effusions not excluded. No acute osseous finding.  IMPRESSION: Degraded by hypoaeration.  Bibasilar opacities; atelectasis versus pneumonia.  Original Report Authenticated By: Waneta Martins, M.D.    Electrocardiogram: Sinus tachycardia at 107 beats per minute. Normal axis. Intervals appear to be normal. No concerning ST or T-wave changes are noted.  Assessment/Plan  Principal Problem:  *CAP (community acquired pneumonia) Active Problems:  Acute respiratory failure  DM type 2 (diabetes mellitus, type 2)  Hypokalemia  Hyponatremia   #1 community-acquired pneumonia with acute respiratory failure: This will be treated with ceftriaxone and azithromycin. There could also be a component of aspiration due to the multiple episodes of vomiting. Urine for strep and Legionella antigens will be checked. He'll be given oxygen and nebulizer treatments.  #2 diabetes, type II: Continue with Lantus insulin and sliding scale. HbA1c will be checked.  #3. Mild hyponatremia and hyperkalemia: She'll be given IV fluids, and his potassium will be repleted.  #4 for right adrenal nodule: This will need to be evaluated as an outpatient.  #5 nausea and vomiting: This is probably due to pneumonia. Symptomatic treatment will be provided. Continue to monitor closely.  DVT, prophylaxis will be initiated.  He is a full code.  Further management decisions will depend on results of further testing and patient's response to treatment.  [facility]  [facility] Pager [phone]  11/10/2011, 5:10 AM

## 2011-11-10 NOTE — Plan of Care (Signed)
Problem: Phase I Progression Outcomes Goal: Pain controlled with appropriate interventions Outcome: Completed/Met Date Met:  11/10/11 Denies pain Goal: OOB as tolerated unless otherwise ordered Outcome: Progressing Ambulates to bathroom with assistance

## 2011-11-11 DIAGNOSIS — E1165 Type 2 diabetes mellitus with hyperglycemia: Secondary | ICD-10-CM

## 2011-11-11 DIAGNOSIS — D696 Thrombocytopenia, unspecified: Secondary | ICD-10-CM

## 2011-11-11 LAB — GLUCOSE, CAPILLARY
Glucose-Capillary: 150 mg/dL — ABNORMAL HIGH (ref 70–99)
Glucose-Capillary: 130 mg/dL — ABNORMAL HIGH (ref 70–99)
Glucose-Capillary: 113 mg/dL — ABNORMAL HIGH (ref 70–99)

## 2011-11-11 LAB — COMPREHENSIVE METABOLIC PANEL
ALT: 15 U/L (ref 0–53)
AST: 15 U/L (ref 0–37)
CO2: 22 mEq/L (ref 19–32)
Calcium: 8.6 mg/dL (ref 8.4–10.5)
Chloride: 104 mEq/L (ref 96–112)
GFR calc non Af Amer: 59 mL/min — ABNORMAL LOW (ref >90)
Sodium: 136 mEq/L (ref 135–145)

## 2011-11-11 LAB — CBC
Hemoglobin: 12 g/dL — ABNORMAL LOW (ref 13.0–17.0)
MCH: 29.6 pg (ref 26.0–34.0)
MCHC: 33.1 g/dL (ref 30.0–36.0)
Platelets: 183 10*3/uL (ref 150–400)

## 2011-11-11 LAB — LEGIONELLA ANTIGEN, URINE: Legionella Antigen, Urine: NEGATIVE

## 2011-11-11 MED ORDER — SODIUM CHLORIDE 0.9 % IJ SOLN
INTRAMUSCULAR | Status: AC
  Administered 2011-11-11: 3 mL
  Filled 2011-11-11: qty 6

## 2011-11-11 MED ORDER — BENZONATATE 100 MG PO CAPS
100.0000 mg | ORAL_CAPSULE | Freq: Three times a day (TID) | ORAL | Status: DC
  Administered 2011-11-11 – 2011-11-12 (×4): 100 mg via ORAL
  Filled 2011-11-11 (×4): qty 1

## 2011-11-11 MED ORDER — KETOROLAC TROMETHAMINE 30 MG/ML IJ SOLN
30.0000 mg | Freq: Four times a day (QID) | INTRAMUSCULAR | Status: DC | PRN
  Administered 2011-11-11 – 2011-11-12 (×2): 30 mg via INTRAVENOUS
  Filled 2011-11-11 (×2): qty 1

## 2011-11-11 MED ORDER — KETOROLAC TROMETHAMINE 30 MG/ML IJ SOLN
30.0000 mg | Freq: Once | INTRAMUSCULAR | Status: AC
  Administered 2011-11-11: 30 mg via INTRAVENOUS
  Filled 2011-11-11: qty 1

## 2011-11-11 NOTE — Progress Notes (Addendum)
Subjective: Complains of pleuritic left-sided chest pain. Hacking cough, nonproductive. Vomiting has resolved, but appetite is poor. Able to take liquids however. Feels weak. Feels slightly short of breath.  Objective: Vital signs in last 24 hours: Filed Vitals:   11/11/11 0152 11/11/11 0215 11/11/11 0415 11/11/11 0739  BP:   107/68   Pulse:   94   Temp:  100.2 F (37.9 C) 99.9 F (37.7 C)   TempSrc:  Oral Oral   Resp:   22   Height:      Weight:      SpO2: 93%  95% 96%   Weight change:   Intake/Output Summary (Last 24 hours) at 11/11/11 1100 Last data filed at 11/11/11 1049  Gross per 24 hour  Intake    480 ml  Output   1025 ml  Net   -545 ml   Lungs: Rales at the bases Cardiovascular regular rate rhythm without murmurs pills rubs Abdomen soft nontender nondistended Extremities no clubbing cyanosis or edema  Lab Results: Basic Metabolic Panel:  Lab 11/11/11 4540 11/10/11 0009  NA 136 133*  K 3.7 3.4*  CL 104 98  CO2 22 22  GLUCOSE 112* 187*  BUN 13 18  CREATININE 1.19 1.09  CALCIUM 8.6 9.6  MG -- --  PHOS -- --   Liver Function Tests:  Lab 11/11/11 0526 11/10/11 0009  AST 15 21  ALT 15 22  ALKPHOS 56 65  BILITOT 1.0 0.6  PROT 6.0 7.4  ALBUMIN 3.2* 3.9   No results found for this basename: LIPASE:2,AMYLASE:2 in the last 168 hours No results found for this basename: AMMONIA:2 in the last 168 hours CBC:  Lab 11/11/11 0526 11/10/11 0009  WBC 13.9* 11.4*  NEUTROABS -- 9.7*  HGB 12.0* 15.1  HCT 36.2* 43.7  MCV 89.2 86.2  PLT 183 209   Cardiac Enzymes:  Lab 11/10/11 0009  CKTOTAL --  CKMB --  CKMBINDEX --  TROPONINI <0.30   BNP:  Lab 11/10/11 0009  PROBNP 37.0   D-Dimer: No results found for this basename: DDIMER:2 in the last 168 hours CBG:  Lab 11/11/11 0724 11/10/11 2007 11/10/11 1622 11/10/11 1122 11/10/11 0810 11/10/11 0007  GLUCAP 150* 201* 112* 129* 195* 186*   Hemoglobin A1C:  Lab 11/10/11 0009  HGBA1C 7.1*   Fasting  Lipid Panel: No results found for this basename: CHOL,HDL,LDLCALC,TRIG,CHOLHDL,LDLDIRECT in the last 981 hours Thyroid Function Tests: No results found for this basename: TSH,T4TOTAL,FREET4,T3FREE,THYROIDAB in the last 168 hours Coagulation: No results found for this basename: LABPROT:4,INR:4 in the last 168 hours Anemia Panel: No results found for this basename: VITAMINB12,FOLATE,FERRITIN,TIBC,IRON,RETICCTPCT in the last 168 hours Urine Drug Screen: Drugs of Abuse  No results found for this basename: labopia, cocainscrnur, labbenz, amphetmu, thcu, labbarb    Alcohol Level: No results found for this basename: ETH:2 in the last 168 hours Urinalysis:  Lab 11/10/11 0040  COLORURINE YELLOW  LABSPEC 1.025  PHURINE 5.5  GLUCOSEU NEGATIVE  HGBUR TRACE*  BILIRUBINUR NEGATIVE  KETONESUR NEGATIVE  PROTEINUR NEGATIVE  UROBILINOGEN 0.2  NITRITE NEGATIVE  LEUKOCYTESUR NEGATIVE   Micro Results: Recent Results (from the past 240 hour(s))  CULTURE, BLOOD (ROUTINE X 2)     Status: Normal (Preliminary result)   Collection Time   11/10/11 12:32 AM      Component Value Range Status Comment   Specimen Description BLOOD LEFT ARM   Final    Special Requests BOTTLES DRAWN AEROBIC AND ANAEROBIC 6CC   Final  Culture NO GROWTH 1 DAY   Final    Report Status PENDING   Incomplete   CULTURE, BLOOD (ROUTINE X 2)     Status: Normal (Preliminary result)   Collection Time   11/10/11  1:08 AM      Component Value Range Status Comment   Specimen Description BLOOD LEFT HAND   Final    Special Requests BOTTLES DRAWN AEROBIC AND ANAEROBIC 8CC   Final    Culture NO GROWTH 1 DAY   Final    Report Status PENDING   Incomplete    Studies/Results: Ct Angio Chest W/cm &/or Wo Cm  11/10/2011  *RADIOLOGY REPORT*  Clinical Data: Respiratory distress  CT ANGIOGRAPHY CHEST  Technique:  Multidetector CT imaging of the chest using the standard protocol during bolus administration of intravenous contrast. Multiplanar  reconstructed images including MIPs were obtained and reviewed to evaluate the vascular anatomy.  Contrast: OMNIPAQUE IOHEXOL 350 MG/ML SOLN  Comparison: 11/09/2011 radiograph  Findings: Lower lobe evaluation is degraded by respiratory motion. Allowing for this, no pulmonary arterial branch filling defect is identified.  Scattered atherosclerosis of the aorta and branch vessels.  Heart size upper normal limits.  No pleural or pericardial effusion.  Bibasilar opacities.  No pneumothorax.  Small amount of debris within the right main bronchus.  Peribronchial thickening and reactive sized hilar and mediastinal lymph nodes.  Limited images of the upper abdomen show nonspecific perinephric fat stranding. Incompletely characterized right adrenal nodule measures 1.4 cm.  No acute osseous finding.  IMPRESSION: Evaluation of lower lobe segmental branches is degraded by respiratory motion.  Allowing for this, no pulmonary arterial branch filling defect identified.  Bibasilar opacities; pneumonia versus aspiration.  Nonspecific perinephric fat stranding.  Correlate with clinical symptoms/urinalysis if concerned for pyelonephritis.  1.4 cm right adrenal nodule is incompletely characterized. Recommend adrenal protocol CT or MRI follow-up.  Original Report Authenticated By: Waneta Martins, M.D.   Dg Chest Portable 1 View  11/10/2011  *RADIOLOGY REPORT*  Clinical Data: Shortness of breath  PORTABLE CHEST - 1 VIEW  Comparison: 04/18/2005  Findings: Degraded by hypoaeration.  Bibasilar opacities.  Central vascular congestion.  Underlying edema not excluded.  Aortic arch atherosclerosis.  No pneumothorax. Pleural effusions not excluded. No acute osseous finding.  IMPRESSION: Degraded by hypoaeration.  Bibasilar opacities; atelectasis versus pneumonia.  Original Report Authenticated By: Waneta Martins, M.D.   Scheduled Meds:   . acarbose  50 mg Oral TID WC  . azithromycin  500 mg Intravenous Q24H  . cefTRIAXone  (ROCEPHIN)  IV  1 g Intravenous Q24H  . enoxaparin  40 mg Subcutaneous Q24H  . insulin aspart  0-15 Units Subcutaneous TID WC  . insulin aspart  0-5 Units Subcutaneous QHS  . insulin glargine  18 Units Subcutaneous QHS  . ipratropium  0.5 mg Nebulization Q6H  . levalbuterol  0.63 mg Nebulization Q6H  . potassium chloride  20 mEq Oral Daily  . simvastatin  10 mg Oral q1800   Continuous Infusions:   . sodium chloride 75 mL/hr at 11/10/11 1002   PRN Meds:.acetaminophen, acetaminophen, levalbuterol, ondansetron (ZOFRAN) IV, ondansetron, oxyCODONE Assessment/Plan: Principal Problem:  *CAP (community acquired pneumonia): Continue azithromycin and Rocephin. Add Lawyer. Toradol for pleurisy Active Problems:  DM type 2 (diabetes mellitus, type 2), controlled  Hypokalemia, resolved  Hyponatremia, resolved  Adrenal nodule, outpatient dedicated scan.  Discontinue telemetry. Increase activity.   LOS: 2 days   Jesse Cordova 11/11/2011, 11:00 AM

## 2011-11-11 NOTE — Care Management Note (Signed)
    Page 1 of 1   11/13/2011     1:44:14 PM   CARE MANAGEMENT NOTE 11/13/2011  Patient:  Jesse Cordova, Jesse Cordova   Account Number:  [id number]  Date Initiated:  11/11/2011  Documentation initiated by:  Rosemary Holms  Subjective/Objective Assessment:   Pt admitted from home. DX PNA and DM. Pt takes care of his wife who has alzheimers. While hospitalized his grandchildren are watching over her. He is independent and never had PNA before this.     Action/Plan:   Spoke to pt. Denies CM needs. Voiced no appetite, cant sleep and denies worried about wife. RN notifed of these complaints.   Anticipated DC Date:  11/13/2011   Anticipated DC Plan:  HOME/SELF CARE      DC Planning Services  CM consult      Choice offered to / List presented to:             Status of service:  Completed, signed off Medicare Important Message given?   (If response is "NO", the following Medicare IM given date fields will be blank) Date Medicare IM given:   Date Additional Medicare IM given:    Discharge Disposition:  HOME/SELF CARE  Per UR Regulation:    If discussed at Long Length of Stay Meetings, dates discussed:    Comments:  11/11/11 925 Marcel Gary RN BSN CM

## 2011-11-12 DIAGNOSIS — R112 Nausea with vomiting, unspecified: Secondary | ICD-10-CM

## 2011-11-12 DIAGNOSIS — D696 Thrombocytopenia, unspecified: Secondary | ICD-10-CM

## 2011-11-12 DIAGNOSIS — E1165 Type 2 diabetes mellitus with hyperglycemia: Secondary | ICD-10-CM

## 2011-11-12 LAB — GLUCOSE, CAPILLARY: Glucose-Capillary: 174 mg/dL — ABNORMAL HIGH (ref 70–99)

## 2011-11-12 MED ORDER — PANTOPRAZOLE SODIUM 40 MG PO TBEC
40.0000 mg | DELAYED_RELEASE_TABLET | Freq: Every day | ORAL | Status: DC
  Administered 2011-11-12 – 2011-11-13 (×2): 40 mg via ORAL
  Filled 2011-11-12 (×2): qty 1

## 2011-11-12 MED ORDER — ONDANSETRON HCL 4 MG PO TABS
4.0000 mg | ORAL_TABLET | Freq: Three times a day (TID) | ORAL | Status: DC
  Administered 2011-11-12 – 2011-11-13 (×2): 4 mg via ORAL
  Filled 2011-11-12 (×2): qty 1

## 2011-11-12 MED ORDER — ASPIRIN EC 81 MG PO TBEC
81.0000 mg | DELAYED_RELEASE_TABLET | Freq: Every day | ORAL | Status: DC
  Administered 2011-11-12 – 2011-11-13 (×2): 81 mg via ORAL
  Filled 2011-11-12 (×2): qty 1

## 2011-11-12 MED ORDER — SODIUM CHLORIDE 0.9 % IJ SOLN
INTRAMUSCULAR | Status: AC
  Administered 2011-11-12: 21:00:00
  Filled 2011-11-12: qty 6

## 2011-11-12 MED ORDER — PROMETHAZINE HCL 25 MG/ML IJ SOLN
6.2500 mg | Freq: Four times a day (QID) | INTRAMUSCULAR | Status: DC | PRN
  Administered 2011-11-12: 6.25 mg via INTRAVENOUS
  Filled 2011-11-12 (×2): qty 1

## 2011-11-12 NOTE — Progress Notes (Signed)
Subjective: Does not feel well. Chest pain improved. Very nauseated today and last night. Vomited several times last night. Not eating much. No shortness of breath.  Objective: Vital signs in last 24 hours: Filed Vitals:   11/12/11 0607 11/12/11 0738 11/12/11 1400 11/12/11 1531  BP: 132/74  148/79   Pulse: 80  89   Temp: 98.7 F (37.1 C)  98.3 F (36.8 C)   TempSrc:   Oral   Resp: 20  20   Height:      Weight:      SpO2: 96% 97% 94% 96%   Weight change:   Intake/Output Summary (Last 24 hours) at 11/12/11 1613 Last data filed at 11/12/11 1200  Gross per 24 hour  Intake    240 ml  Output   1525 ml  Net  -1285 ml   Lungs: Rales at the bases Cardiovascular regular rate rhythm without murmurs pills rubs Abdomen soft nontender nondistended Extremities no clubbing cyanosis or edema  Lab Results: Basic Metabolic Panel:  Lab 11/11/11 1478 11/10/11 0009  NA 136 133*  K 3.7 3.4*  CL 104 98  CO2 22 22  GLUCOSE 112* 187*  BUN 13 18  CREATININE 1.19 1.09  CALCIUM 8.6 9.6  MG -- --  PHOS -- --   Liver Function Tests:  Lab 11/11/11 0526 11/10/11 0009  AST 15 21  ALT 15 22  ALKPHOS 56 65  BILITOT 1.0 0.6  PROT 6.0 7.4  ALBUMIN 3.2* 3.9   No results found for this basename: LIPASE:2,AMYLASE:2 in the last 168 hours No results found for this basename: AMMONIA:2 in the last 168 hours CBC:  Lab 11/11/11 0526 11/10/11 0009  WBC 13.9* 11.4*  NEUTROABS -- 9.7*  HGB 12.0* 15.1  HCT 36.2* 43.7  MCV 89.2 86.2  PLT 183 209   Cardiac Enzymes:  Lab 11/10/11 0009  CKTOTAL --  CKMB --  CKMBINDEX --  TROPONINI <0.30   BNP:  Lab 11/10/11 0009  PROBNP 37.0   D-Dimer: No results found for this basename: DDIMER:2 in the last 168 hours CBG:  Lab 11/12/11 1201 11/12/11 0736 11/11/11 2204 11/11/11 1703 11/11/11 1135 11/11/11 0724  GLUCAP 167* 153* 169* 113* 130* 150*   Hemoglobin A1C:  Lab 11/10/11 0009  HGBA1C 7.1*   Fasting Lipid Panel: No results found for  this basename: CHOL,HDL,LDLCALC,TRIG,CHOLHDL,LDLDIRECT in the last 295 hours Thyroid Function Tests: No results found for this basename: TSH,T4TOTAL,FREET4,T3FREE,THYROIDAB in the last 168 hours Coagulation: No results found for this basename: LABPROT:4,INR:4 in the last 168 hours Anemia Panel: No results found for this basename: VITAMINB12,FOLATE,FERRITIN,TIBC,IRON,RETICCTPCT in the last 168 hours Urine Drug Screen: Drugs of Abuse  No results found for this basename: labopia,  cocainscrnur,  labbenz,  amphetmu,  thcu,  labbarb    Alcohol Level: No results found for this basename: ETH:2 in the last 168 hours Urinalysis:  Lab 11/10/11 0040  COLORURINE YELLOW  LABSPEC 1.025  PHURINE 5.5  GLUCOSEU NEGATIVE  HGBUR TRACE*  BILIRUBINUR NEGATIVE  KETONESUR NEGATIVE  PROTEINUR NEGATIVE  UROBILINOGEN 0.2  NITRITE NEGATIVE  LEUKOCYTESUR NEGATIVE   Micro Results: Recent Results (from the past 240 hour(s))  CULTURE, BLOOD (ROUTINE X 2)     Status: Normal (Preliminary result)   Collection Time   11/10/11 12:32 AM      Component Value Range Status Comment   Specimen Description BLOOD LEFT ARM   Final    Special Requests BOTTLES DRAWN AEROBIC AND ANAEROBIC 6CC   Final  Culture NO GROWTH 2 DAYS   Final    Report Status PENDING   Incomplete   CULTURE, BLOOD (ROUTINE X 2)     Status: Normal (Preliminary result)   Collection Time   11/10/11  1:08 AM      Component Value Range Status Comment   Specimen Description BLOOD LEFT HAND   Final    Special Requests BOTTLES DRAWN AEROBIC AND ANAEROBIC 8CC   Final    Culture NO GROWTH 2 DAYS   Final    Report Status PENDING   Incomplete    Studies/Results: No results found. Scheduled Meds:    . aspirin EC  81 mg Oral Daily  . azithromycin  500 mg Intravenous Q24H  . cefTRIAXone (ROCEPHIN)  IV  1 g Intravenous Q24H  . enoxaparin  40 mg Subcutaneous Q24H  . insulin aspart  0-15 Units Subcutaneous TID WC  . insulin aspart  0-5 Units  Subcutaneous QHS  . insulin glargine  18 Units Subcutaneous QHS  . levalbuterol  0.63 mg Nebulization Q6H  . ondansetron  4 mg Oral TID AC  . pantoprazole  40 mg Oral Daily  . simvastatin  10 mg Oral q1800  . sodium chloride      . DISCONTD: acarbose  50 mg Oral TID WC  . DISCONTD: benzonatate  100 mg Oral TID   Continuous Infusions:  PRN Meds:.acetaminophen, acetaminophen, ketorolac, levalbuterol, oxyCODONE, promethazine, DISCONTD: ondansetron (ZOFRAN) IV, DISCONTD: ondansetron Assessment/Plan: Principal Problem:  *CAP (community acquired pneumonia): Continue azithromycin and Rocephin. Add Lawyer. Toradol for pleurisy Nausea Active Problems:  DM type 2 (diabetes mellitus, type 2), controlled  Hypokalemia, resolved  Hyponatremia, resolved  Adrenal nodule, outpatient dedicated scan.  Will schedule Zofran before meals. Continue IV antibiotics because of nausea. Will stop Tessalon Perles in case this may be worsening his nausea.   LOS: 3 days   Jesse Cordova 11/12/2011, 4:13 PM

## 2011-11-13 DIAGNOSIS — E1165 Type 2 diabetes mellitus with hyperglycemia: Secondary | ICD-10-CM

## 2011-11-13 DIAGNOSIS — D696 Thrombocytopenia, unspecified: Secondary | ICD-10-CM

## 2011-11-13 DIAGNOSIS — R112 Nausea with vomiting, unspecified: Secondary | ICD-10-CM

## 2011-11-13 LAB — GLUCOSE, CAPILLARY: Glucose-Capillary: 161 mg/dL — ABNORMAL HIGH (ref 70–99)

## 2011-11-13 MED ORDER — ACETAMINOPHEN 325 MG PO TABS: 650.0000 mg | ORAL_TABLET | Freq: Four times a day (QID) | ORAL | Status: AC | PRN

## 2011-11-13 MED ORDER — CEFUROXIME AXETIL 500 MG PO TABS: 500.0000 mg | ORAL_TABLET | Freq: Two times a day (BID) | ORAL | Status: AC

## 2011-11-13 MED ORDER — SODIUM CHLORIDE 0.9 % IJ SOLN
INTRAMUSCULAR | Status: AC
  Filled 2011-11-13: qty 3

## 2011-11-13 MED ORDER — PROMETHAZINE HCL 12.5 MG PO TABS: 12.5000 mg | ORAL_TABLET | Freq: Four times a day (QID) | ORAL | Status: DC | PRN

## 2011-11-13 NOTE — Discharge Instructions (Signed)

## 2011-11-13 NOTE — Progress Notes (Signed)
Patient discharged home.  Antibiotics switched to PO.  Instructed on new medications and how to take them.  Verbalizes understanding of all discharge instructions.  Patient sates he feels ready to go home.  IV removed - WNL.  No questions at this time.

## 2011-11-13 NOTE — Discharge Summary (Signed)
Physician Discharge Summary  Patient ID: Jesse Cordova MRN: [id number] DOB/AGE: 07-21-1940 71 y.o.  Admit date: 11/09/2011 Discharge date: 11/13/2011  Discharge Diagnoses:  Principal Problem:  *CAP (community acquired pneumonia) Active Problems:  DM type 2 (diabetes mellitus, type 2)  Hypokalemia  Hyponatremia  Adrenal nodule, needs outpatient CT Or MRI of adrenals Nausea, vomiting  Medication List  As of 11/13/2011 11:07 AM   TAKE these medications         acarbose 50 MG tablet   Commonly known as: PRECOSE   Take 50 mg by mouth 2 (two) times daily.      acetaminophen 325 MG tablet   Commonly known as: TYLENOL   Take 2 tablets (650 mg total) by mouth every 6 (six) hours as needed for pain or fever (or Fever >/= 101).      aspirin EC 81 MG tablet   Take 81 mg by mouth daily.      cefUROXime 500 MG tablet   Commonly known as: CEFTIN   Take 1 tablet (500 mg total) by mouth 2 (two) times daily.      glyBURIDE micronized 6 MG tablet   Commonly known as: GLYNASE   Take 6 mg by mouth 2 (two) times daily before a meal.      insulin glargine 100 UNIT/ML injection   Commonly known as: LANTUS   Inject 18 Units into the skin at bedtime.      lansoprazole 15 MG capsule   Commonly known as: PREVACID   Take 15 mg by mouth daily.      lovastatin 20 MG tablet   Commonly known as: MEVACOR   Take 20 mg by mouth at bedtime.      ONGLYZA 5 MG Tabs tablet   Generic drug: saxagliptin HCl   Take 5 mg by mouth daily.      promethazine 12.5 MG tablet   Commonly known as: PHENERGAN   Take 1 tablet (12.5 mg total) by mouth every 6 (six) hours as needed for nausea.            Discharge Orders    Future Orders Please Complete By Expires   Diet general      Increase activity slowly         Disposition: home  Discharged Condition: stable  Consults:  none  Labs:    Sample type       ARTERIAL       Delivery systems       NASAL CANNULA       O2 Content       2.0       pH,  Arterial       7.427       pCO2 arterial       33.2       pO2, Arterial       68.0       Bicarbonate       21.5       TCO2       18.6       Acid-base deficit       2.2       O2 Saturation       93.9       Patient temperature       37.0       Collection site       LEFT RADIAL       Allens test (pass/fail)       PASS  CHEM PROFILE    Sodium       133 136      Potassium       3.4 3.7      Chloride       98 104      CO2       22 22      Mean Plasma Glucose       157       BUN       18 13      Creatinine, Ser       1.09 1.19      Calcium       9.6 8.6      GFR calc non Af Amer       66 59      GFR calc Af Amer       76  69       Glucose, Bld       187 112      Alkaline Phosphatase       65 56      Albumin       3.9 3.2      AST       21 15      ALT       22 15      Total Protein       7.4 6.0      Total Bilirubin       0.6 1.0       CARDIAC PROFILE    Troponin I       <0.30        Pro B Natriuretic peptide (BNP)       37.0        OTHER CHEM    Lactic Acid, Venous       2.0        CBC    WBC       11.4 13.9      RBC       5.07 4.06      Hemoglobin       15.1 12.0       HCT       43.7 36.2      MCV       86.2 89.2      MCH       29.8 29.6      MCHC       34.6 33.1      RDW       13.2 13.5      Platelets       209 183       DIFFERENTIAL    Neutrophils Relative       85       Lymphocytes Relative       10       Monocytes Relative       4       Eosinophils Relative       1       Basophils Relative       0       Neutro Abs       9.7       Lymphs Abs       1.2       Monocytes Absolute       0.5       Eosinophils Absolute       0.1       Basophils Absolute  0.0        DIABETES    Hemoglobin A1C       =6.5% Diagnostic of Diabetes Mellitus (if abnormal result is confirmed) 5.7-6.4% Increased risk of developing Diabetes Mellitus References:Diagnosis and Classification of Diabetes Mellitus,Diabetes Care,2011,34(Suppl 1):S62-S69 and Standards of Medical  Care in .[place] KitchenMarland Kitchen"7.1 =6.5% Diagnostic of Diabetes Mellitus (if abnormal result is confirmed) 5.7-6.4% Increased risk of developing Diabetes Mellitus References:Diagnosis and Classification of Diabetes Mellitus,Diabetes Care,2011,34(Suppl 1):S62-S69 and Standards of Medical Care in ..." border=0 src="[email or web address]" width=5 height=10       Glucose, Bld       187 112       URINALYSIS    Color, Urine       YELLOW       APPearance       CLEAR       Specific Gravity, Urine       1.025       pH       5.5       Glucose, UA       NEGATIVE       Bilirubin Urine       NEGATIVE       Ketones, ur       NEGATIVE       Protein, ur       NEGATIVE       Urobilinogen, UA       0.2       Nitrite       NEGATIVE       Leukocytes, UA       NEGATIVE       Hgb urine dipstick       TRACE       WBC, UA       0-2       RBC / HPF       0-2       Squamous Epithelial / LPF       RARE       Bacteria, UA       RARE        URINE CHEMISTRY    Legionella Antigen, Urine       Negative for Legionella pneumophilia serogroup 1        URINE, OTHER    Strep Pneumo Urinary Antigen       NEGATIVE      Diagnostics:  Ct Angio Chest W/cm &/or Wo Cm  11/10/2011  *RADIOLOGY REPORT*  Clinical Data: Respiratory distress  CT ANGIOGRAPHY CHEST  Technique:  Multidetector CT imaging of the chest using the standard protocol during bolus administration of intravenous contrast. Multiplanar reconstructed images including MIPs were obtained and reviewed to evaluate the vascular anatomy.  Contrast: OMNIPAQUE IOHEXOL 350 MG/ML SOLN  Comparison: 11/09/2011 radiograph  Findings: Lower lobe evaluation is degraded by respiratory motion. Allowing for this, no pulmonary arterial branch filling defect is identified.  Scattered atherosclerosis of the aorta and branch vessels.  Heart size upper normal limits.  No pleural or pericardial effusion.  Bibasilar opacities.  No  pneumothorax.  Small amount of debris within the right main bronchus.  Peribronchial thickening and reactive sized hilar and mediastinal lymph nodes.  Limited images of the upper abdomen show nonspecific perinephric fat stranding. Incompletely characterized right adrenal nodule measures 1.4 cm.  No acute osseous finding.  IMPRESSION: Evaluation of lower lobe segmental branches is degraded by respiratory motion.  Allowing for this, no pulmonary arterial branch filling defect identified.  Bibasilar opacities; pneumonia versus aspiration.  Nonspecific perinephric fat stranding.  Correlate with clinical symptoms/urinalysis if concerned for pyelonephritis.  1.4 cm right adrenal nodule is incompletely characterized. Recommend adrenal protocol CT or MRI follow-up.  Original Report Authenticated By: Waneta Martins, M.D.   Dg Chest Portable 1 View  11/10/2011  *RADIOLOGY REPORT*  Clinical Data: Shortness of breath  PORTABLE CHEST - 1 VIEW  Comparison: 04/18/2005  Findings: Degraded by hypoaeration.  Bibasilar opacities.  Central vascular congestion.  Underlying edema not excluded.  Aortic arch atherosclerosis.  No pneumothorax. Pleural effusions not excluded. No acute osseous finding.  IMPRESSION: Degraded by hypoaeration.  Bibasilar opacities; atelectasis versus pneumonia.  Original Report Authenticated By: Waneta Martins, M.D.    Procedures: none  EKG: Sinus tachycardia with left each enlargement  Full Code   Hospital Course:  See H&P for complete admission details. The patient is a pleasant 71 year old white male who presented with shortness of breath and dry cough. he had pleuritic left chest pain. Patient was tachycardic with heart rate of 118 in the emergency room. Otherwise normal vital signs. He was in no distress. He had rales at both bases and a few rhonchi. White blood cell count was 11,400. Chest x-ray showed by basilar opacities. CT angiogram of the chest showed no PE. By basilar opacities,  pneumonia versus aspiration. 1.4 cm right adrenal nodule incompletely characterized, recommend adrenal protocol CAT scan or MRI followup.  The patient was admitted to the hospitalist service started on Rocephin and azithromycin for community-acquired pneumonia. He had a lot of nausea and vomiting while here. It has responded to Zofran. His electrolyte disturbances were corrected. By the time of discharge, he was tolerating a solid diet with intermittent nausea, controlled by anti-emetics. He has stable vital signs he is ambulating and feeling much better. He will need outpatient evaluation of his adrenal nodule. Total time on the day of discharge greater than 30 minutes.  Discharge Exam:  Blood pressure 131/77, pulse 78, temperature 98.7 F (37.1 C), temperature source Oral, resp. rate 18, height 6\' 1"  (1.854 m), weight 88.451 kg (195 lb), SpO2 93.00%.  General: Much brighter today. Lungs: Rales at the left base no wheezes or rhonchi Cardiovascular regular rate rhythm without murmurs gallops rubs Abdomen soft nontender nondistended Extremities no clubbing cyanosis or edema  Signed: Delyle Weider L 11/13/2011, 11:07 AM

## 2011-11-14 ENCOUNTER — Other Ambulatory Visit: Payer: Self-pay | Admitting: Family Medicine

## 2011-11-14 DIAGNOSIS — E279 Disorder of adrenal gland, unspecified: Secondary | ICD-10-CM

## 2011-11-15 LAB — CULTURE, BLOOD (ROUTINE X 2)
Culture: NO GROWTH
Culture: NO GROWTH

## 2011-11-19 ENCOUNTER — Ambulatory Visit (HOSPITAL_COMMUNITY)
Admission: RE | Admit: 2011-11-19 | Discharge: 2011-11-19 | Disposition: A | Discharge status: Home / Self Care | Payer: Medicare Other | Source: Ambulatory Visit | Attending: Family Medicine | Admitting: Family Medicine

## 2011-11-19 DIAGNOSIS — E278 Other specified disorders of adrenal gland: Secondary | ICD-10-CM | POA: Insufficient documentation

## 2011-11-19 DIAGNOSIS — R933 Abnormal findings on diagnostic imaging of other parts of digestive tract: Secondary | ICD-10-CM | POA: Insufficient documentation

## 2011-11-19 MED ORDER — IOHEXOL 300 MG/ML  SOLN
100.0000 mL | Freq: Once | INTRAMUSCULAR | Status: AC | PRN
  Administered 2011-11-19: 100 mL via INTRAVENOUS

## 2011-11-19 MED ORDER — IOHEXOL 300 MG/ML  SOLN: 100.0000 mL | Freq: Once | INTRAMUSCULAR | Status: AC | PRN

## 2011-12-08 ENCOUNTER — Other Ambulatory Visit: Payer: Self-pay | Admitting: Family Medicine

## 2011-12-08 ENCOUNTER — Ambulatory Visit (HOSPITAL_COMMUNITY)
Admission: RE | Admit: 2011-12-08 | Discharge: 2011-12-08 | Disposition: A | Discharge status: Home / Self Care | Payer: Medicare Other | Source: Ambulatory Visit | Attending: Family Medicine | Admitting: Family Medicine

## 2011-12-08 DIAGNOSIS — Z09 Encounter for follow-up examination after completed treatment for conditions other than malignant neoplasm: Secondary | ICD-10-CM | POA: Insufficient documentation

## 2011-12-08 DIAGNOSIS — J189 Pneumonia, unspecified organism: Secondary | ICD-10-CM | POA: Insufficient documentation

## 2011-12-10 ENCOUNTER — Encounter (INDEPENDENT_AMBULATORY_CARE_PROVIDER_SITE_OTHER): Payer: Self-pay | Admitting: *Deleted

## 2011-12-24 ENCOUNTER — Ambulatory Visit (INDEPENDENT_AMBULATORY_CARE_PROVIDER_SITE_OTHER): Payer: Medicare Other | Admitting: Internal Medicine

## 2011-12-25 ENCOUNTER — Encounter (INDEPENDENT_AMBULATORY_CARE_PROVIDER_SITE_OTHER): Payer: Self-pay | Admitting: Internal Medicine

## 2011-12-25 ENCOUNTER — Telehealth (INDEPENDENT_AMBULATORY_CARE_PROVIDER_SITE_OTHER): Payer: Self-pay | Admitting: *Deleted

## 2011-12-25 ENCOUNTER — Other Ambulatory Visit (INDEPENDENT_AMBULATORY_CARE_PROVIDER_SITE_OTHER): Payer: Self-pay | Admitting: *Deleted

## 2011-12-25 ENCOUNTER — Ambulatory Visit (INDEPENDENT_AMBULATORY_CARE_PROVIDER_SITE_OTHER): Payer: Medicare Other | Admitting: Internal Medicine

## 2011-12-25 VITALS — BP 132/62 | HR 80 | Temp 97.5°F | Ht 73.0 in | Wt 198.2 lb

## 2011-12-25 DIAGNOSIS — Z1211 Encounter for screening for malignant neoplasm of colon: Secondary | ICD-10-CM

## 2011-12-25 DIAGNOSIS — D131 Benign neoplasm of stomach: Secondary | ICD-10-CM

## 2011-12-25 DIAGNOSIS — K317 Polyp of stomach and duodenum: Secondary | ICD-10-CM

## 2011-12-25 MED ORDER — PEG 3350-KCL-NABCB-NACL-NASULF 236 G PO SOLR: 4.0000 L | Freq: Once | ORAL | Status: AC

## 2011-12-25 NOTE — Patient Instructions (Addendum)
EGD and screening colonoscopy. The risks and benefits such as perforation, bleeding, and infection were reviewed with the patient and is agreeable.

## 2011-12-25 NOTE — Telephone Encounter (Signed)
Patient needs trilyte 

## 2011-12-25 NOTE — Progress Notes (Signed)
Subjective:     Patient ID: Jesse Cordova, male   DOB: 02-Aug-1940, 71 y.o.   MRN: [id number]  HPI Brayln is a 71 yr old male here for gastric polyps. Referred by Dr. Lilyan Punt.  He underwent a CT abdomen and Pelvis 11/19/2011 for f/u of a rt adrenal nodule on chest CT. Impression: Suspected gastric and duodenal polyps. Malignancy cannot be excluded. Endoscopic correlation recommended. Appetite is fair.   No weight loss. No dysphagia. No abdominal pain.  He has a BM x 1 day. No melena or bright red rectal bleeding.  He has never undergone a colonoscopy    Review of Systems see hpi Current Outpatient Prescriptions  Medication Sig Dispense Refill  . acetaminophen (TYLENOL) 325 MG tablet Take 2 tablets (650 mg total) by mouth every 6 (six) hours as needed for pain or fever (or Fever >/= 101).      Marland Kitchen glyBURIDE micronized (GLYNASE) 6 MG tablet Take 6 mg by mouth 2 (two) times daily before a meal.      . insulin glargine (LANTUS) 100 UNIT/ML injection Inject 18 Units into the skin at bedtime.      . lansoprazole (PREVACID) 15 MG capsule Take 15 mg by mouth daily.      Marland Kitchen lovastatin (MEVACOR) 20 MG tablet Take 20 mg by mouth at bedtime.      . saxagliptin HCl (ONGLYZA) 5 MG TABS tablet Take 5 mg by mouth daily.      Marland Kitchen aspirin EC 81 MG tablet Take 81 mg by mouth daily.       Past Medical History  Diagnosis Date  . Diabetes mellitus   . Hyperlipemia   . Adrenal nodule 11/10/2011   Past Surgical History  Procedure Date  . Vasectomy   . Knee arthroscopy    History   Social History  . Marital Status: Married    Spouse Name: N/A    Number of Children: N/A  . Years of Education: N/A   Occupational History  . Not on file.   Social History Main Topics  . Smoking status: Passive Smoker    Types: Cigarettes  . Smokeless tobacco: Not on file   Comment: From spouse. Wife does not smoke around him now  . Alcohol Use: No  . Drug Use: No  . Sexually Active: No   Other Topics Concern  . Not  on file   Social History Narrative  . No narrative on file   Family Status  Relation Status Death Age  . Mother Deceased     alzheimer's  . Father Deceased     CAD  . Sister Alive     DM2,    No Known Allergies      Objective:   Physical Exam   Filed Vitals:   12/25/11 1040  Height: 6\' 1"  (1.854 m)  Weight: 198 lb 3.2 oz (89.903 kg)   Alert and oriented. Skin warm and dry. Oral mucosa is moist.   . Sclera anicteric, conjunctivae is pink. Thyroid not enlarged. No cervical lymphadenopathy. Lungs clear. Heart regular rate and rhythm.  Abdomen is soft. Bowel sounds are positive. No hepatomegaly. No abdominal masses felt. No tenderness.  No edema to lower extremities      Assessment:    Gastric polyps. Screening colonoscopy    Plan:    EGD with biopsy.  Screening colonoscopy. The risks and benefits such as perforation, bleeding, and infection were reviewed with the patient and is agreeable.

## 2012-01-02 ENCOUNTER — Encounter (HOSPITAL_COMMUNITY): Payer: Self-pay | Admitting: Pharmacy Technician

## 2012-01-13 ENCOUNTER — Encounter (INDEPENDENT_AMBULATORY_CARE_PROVIDER_SITE_OTHER): Payer: Self-pay

## 2012-01-16 ENCOUNTER — Encounter (HOSPITAL_COMMUNITY): Payer: Self-pay

## 2012-01-16 ENCOUNTER — Encounter (HOSPITAL_COMMUNITY)
Admission: RE | Disposition: A | Discharge status: Home / Self Care | Payer: Self-pay | Source: Ambulatory Visit | Attending: Internal Medicine

## 2012-01-16 ENCOUNTER — Ambulatory Visit (HOSPITAL_COMMUNITY)
Admission: RE | Admit: 2012-01-16 | Discharge: 2012-01-16 | Disposition: A | Discharge status: Home / Self Care | Payer: Medicare Other | Source: Ambulatory Visit | Attending: Internal Medicine | Admitting: Internal Medicine

## 2012-01-16 DIAGNOSIS — D126 Benign neoplasm of colon, unspecified: Secondary | ICD-10-CM | POA: Insufficient documentation

## 2012-01-16 DIAGNOSIS — E119 Type 2 diabetes mellitus without complications: Secondary | ICD-10-CM | POA: Insufficient documentation

## 2012-01-16 DIAGNOSIS — R933 Abnormal findings on diagnostic imaging of other parts of digestive tract: Secondary | ICD-10-CM

## 2012-01-16 DIAGNOSIS — Z794 Long term (current) use of insulin: Secondary | ICD-10-CM | POA: Insufficient documentation

## 2012-01-16 DIAGNOSIS — D131 Benign neoplasm of stomach: Secondary | ICD-10-CM | POA: Insufficient documentation

## 2012-01-16 DIAGNOSIS — K317 Polyp of stomach and duodenum: Secondary | ICD-10-CM

## 2012-01-16 DIAGNOSIS — Z1211 Encounter for screening for malignant neoplasm of colon: Secondary | ICD-10-CM | POA: Insufficient documentation

## 2012-01-16 DIAGNOSIS — K5730 Diverticulosis of large intestine without perforation or abscess without bleeding: Secondary | ICD-10-CM

## 2012-01-16 DIAGNOSIS — K644 Residual hemorrhoidal skin tags: Secondary | ICD-10-CM

## 2012-01-16 DIAGNOSIS — Z01812 Encounter for preprocedural laboratory examination: Secondary | ICD-10-CM | POA: Insufficient documentation

## 2012-01-16 DIAGNOSIS — E785 Hyperlipidemia, unspecified: Secondary | ICD-10-CM | POA: Insufficient documentation

## 2012-01-16 HISTORY — DX: Other specified postprocedural states: Z98.890

## 2012-01-16 HISTORY — DX: Adverse effect of unspecified anesthetic, initial encounter: T41.45XA

## 2012-01-16 HISTORY — DX: Other complications of anesthesia, initial encounter: T88.59XA

## 2012-01-16 HISTORY — PX: BIOPSY: SHX5522

## 2012-01-16 HISTORY — DX: Other specified postprocedural states: R11.2

## 2012-01-16 SURGERY — COLONOSCOPY WITH ESOPHAGOGASTRODUODENOSCOPY (EGD)
Anesthesia: Moderate Sedation

## 2012-01-16 MED ORDER — MIDAZOLAM HCL 5 MG/5ML IJ SOLN
INTRAMUSCULAR | Status: DC | PRN
  Administered 2012-01-16: 2 mg via INTRAVENOUS
  Administered 2012-01-16: 1 mg via INTRAVENOUS
  Administered 2012-01-16: 2 mg via INTRAVENOUS

## 2012-01-16 MED ORDER — SODIUM CHLORIDE 0.45 % IV SOLN
Freq: Once | INTRAVENOUS | Status: AC
  Administered 2012-01-16: 08:00:00 via INTRAVENOUS

## 2012-01-16 MED ORDER — MEPERIDINE HCL 50 MG/ML IJ SOLN
INTRAMUSCULAR | Status: DC | PRN
  Administered 2012-01-16 (×2): 25 mg via INTRAVENOUS

## 2012-01-16 MED ORDER — MEPERIDINE HCL 50 MG/ML IJ SOLN
INTRAMUSCULAR | Status: AC
  Filled 2012-01-16: qty 1

## 2012-01-16 MED ORDER — MIDAZOLAM HCL 5 MG/5ML IJ SOLN
INTRAMUSCULAR | Status: AC
  Filled 2012-01-16: qty 10

## 2012-01-16 MED ORDER — STERILE WATER FOR IRRIGATION IR SOLN
Status: DC | PRN
  Administered 2012-01-16: 09:00:00

## 2012-01-16 MED ORDER — BUTAMBEN-TETRACAINE-BENZOCAINE 2-2-14 % EX AERO
INHALATION_SPRAY | CUTANEOUS | Status: DC | PRN
  Administered 2012-01-16: 2 via TOPICAL

## 2012-01-16 NOTE — Op Note (Signed)
EGD AND COLONOSCOPY PROCEDURE REPORT  PATIENT:  Jesse Cordova  MR#:  [id number] Birthdate:  June 15, 1940, 71 y.o., male Endoscopist:  Dr. Malissa Hippo, MD Referred By:  Dr. Bonnetta Barry ref. provider found Procedure Date: 01/16/2012  Procedure:   EGD & Colonoscopy  Indications:  Patient is 71 year old Caucasian male who is found to have gastric and duodenal polyp and CT scan. He is also undergoing screening colonoscopy.            Informed Consent:  The risks, benefits, alternatives & imponderables which include, but are not limited to, bleeding, infection, perforation, drug reaction and potential missed lesion have been reviewed.  The potential for biopsy, lesion removal, esophageal dilation, etc. have also been discussed.  Questions have been answered.  All parties agreeable.  Please see history & physical in medical record for more information.  Medications:  Demerol 50 mg IV Versed 5 mg IV Cetacaine spray topically for oropharyngeal anesthesia  EGD  Description of procedure:  The endoscope was introduced through the mouth and advanced to the second portion of the duodenum without difficulty or limitations. The mucosal surfaces were surveyed very carefully during advancement of the scope and upon withdrawal.  Findings:  Esophagus:  Mucosa of the esophagus was normal. GE junction was unremarkable. GEJ:  41 cm Stomach:  Stomach was empty and distended very well insufflation. Folds in the proximal stomach are normal. There was 14 mm polyp at antrum along with 30 mm polyp in the prepyloric region. Both of these polyps were snared. Hemoclip applied to polypectomy site and prepyloric region. Although these polyps are retrieved using twister plus net. Duodenum:  Normal bulbar and post bulbar mucosa.  Therapeutic/Diagnostic Maneuvers Performed:  See above  COLONOSCOPY Description of procedure:  After a digital rectal exam was performed, that colonoscope was advanced from the anus through the rectum  and colon to the area of the cecum, ileocecal valve and appendiceal orifice. The cecum was deeply intubated. These structures were well-seen and photographed for the record. From the level of the cecum and ileocecal valve, the scope was slowly and cautiously withdrawn. The mucosal surfaces were carefully surveyed utilizing scope tip to flexion to facilitate fold flattening as needed. The scope was pulled down into the rectum where a thorough exam including retroflexion was performed.  Findings:   Prep excellent. Scattered diverticula at sigmoid colon. 30 mm  broad-based polyp at ascending colon. This polyp was snared piecemeal. Polypectomy felt to be complete. 2 hemoclips applied to polypectomy site. All of the fragments were retrieved using twister plus retrievable device. Mucosa rest of the colon was normal. Prominent hemorrhoids below the dentate line.  Therapeutic/Diagnostic Maneuvers Performed:  See above  Complications:  None  Cecal Withdrawal Time:  23 minutes  Impression:  Two gastric polyps snared. One at gastric antrum was 14 mm second polyp was in the prepyloric region over 3 cm in diameter. Multipara applied to polypectomy site in  prepyloric region. Large broad-based polyp snared from ascending colon. Two hemoclips applied to polypectomy site. Scattered diverticula at sigmoid colon. External hemorrhoids.  Recommendations:  Standard instructions given. I will contact patient with biopsy results and further recommendations.  REHMAN,NAJEEB U  01/16/2012 9:43 AM  CC: Dr. Lilyan Punt, MD & Dr. Bonnetta Barry ref. provider found

## 2012-01-16 NOTE — H&P (Signed)
Jesse Cordova is an 71 y.o. male.   Chief Complaint: Patient is here for esophagogastroduodenoscopy and colonoscopy. HPI: Patient is 71 year old Caucasian male recently treated for pneumonia. CT suggested a gastric polyps. He then had abdominopelvic CT revealing gastric and duodenal polyp. He is therefore undergoing diagnostic/therapeutic EGD. He is also undergoing average risk screening colonoscopy. He denies nausea vomiting heartburn abdominal pain melena or rectal bleeding. He has a good appetite and his weight has been stable. Family history is negative for colorectal carcinoma.  Past Medical History  Diagnosis Date  . Diabetes mellitus   . Hyperlipemia   . 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 Complication of anesthesia   . PONV (postoperative nausea and vomiting)     Past Surgical History  Procedure Date  . Vasectomy   . Knee arthroscopy     History reviewed. No pertinent family history. Social History:  reports that he has been passively smoking Cigarettes.  He does not have any smokeless tobacco history on file. He reports that he does not drink alcohol or use illicit drugs.  Allergies: No Known Allergies  Medications Prior to Admission  Medication Sig Dispense Refill  . aspirin EC 81 MG tablet Take 81 mg by mouth daily.      Marland Kitchen glyBURIDE micronized (GLYNASE) 6 MG tablet Take 6 mg by mouth 2 (two) times daily before a meal.      . insulin glargine (LANTUS) 100 UNIT/ML injection Inject 18 Units into the skin at bedtime.      . lansoprazole (PREVACID) 15 MG capsule Take 15 mg by mouth daily.      Marland Kitchen lovastatin (MEVACOR) 20 MG tablet Take 20 mg by mouth at bedtime.      . saxagliptin HCl (ONGLYZA) 5 MG TABS tablet Take 5 mg by mouth daily.      . silver sulfADIAZINE (SILVADENE) 1 % cream Apply 1 application topically Once daily as needed. Burn      . acetaminophen (TYLENOL) 325 MG tablet Take 2 tablets (650 mg total) by mouth every 6 (six) hours as needed for pain or fever (or Fever >/=  101).        Results for orders placed during the hospital encounter of 01/16/12 (from the past 48 hour(s))  GLUCOSE, CAPILLARY     Status: Abnormal   Collection Time   01/16/12  8:03 AM      Component Value Range Comment   Glucose-Capillary 154 (*) 70 - 99 mg/dL    No results found.  ROS  Blood pressure 156/96, pulse 92, temperature 97.4 F (36.3 C), temperature source Oral, resp. rate 16, height 6\' 1"  (1.854 m), weight 194 lb (87.998 kg), SpO2 97.00%. Physical Exam  Constitutional: He appears well-developed and well-nourished.  HENT:  Mouth/Throat: Oropharynx is clear and moist.  Eyes: Conjunctivae are normal. No scleral icterus.  Neck: No thyromegaly present.  Cardiovascular: Normal rate, regular rhythm and normal heart sounds.   No murmur heard. Respiratory: Effort normal and breath sounds normal.  GI: He exhibits no distension and no mass. There is no tenderness.  Lymphadenopathy:    He has no cervical adenopathy.     Assessment/Plan Gastric and duodenal polyps. Diagnostic and therapeutic EGD. Average risk screening colonoscopy.  Jesse Cordova U 01/16/2012, 8:42 AM

## 2012-01-21 ENCOUNTER — Encounter (HOSPITAL_COMMUNITY): Payer: Self-pay | Admitting: Internal Medicine

## 2012-01-29 ENCOUNTER — Encounter (INDEPENDENT_AMBULATORY_CARE_PROVIDER_SITE_OTHER): Payer: Self-pay | Admitting: *Deleted

## 2012-01-29 HISTORY — PX: COLONOSCOPY: SHX174

## 2012-08-11 ENCOUNTER — Encounter: Payer: Self-pay | Admitting: *Deleted

## 2012-08-13 ENCOUNTER — Encounter: Payer: Self-pay | Admitting: *Deleted

## 2012-08-14 ENCOUNTER — Encounter: Payer: Self-pay | Admitting: *Deleted

## 2012-08-17 ENCOUNTER — Ambulatory Visit: Payer: Self-pay | Admitting: Family Medicine

## 2012-08-23 ENCOUNTER — Encounter: Payer: Self-pay | Admitting: Family Medicine

## 2012-08-23 ENCOUNTER — Ambulatory Visit (INDEPENDENT_AMBULATORY_CARE_PROVIDER_SITE_OTHER): Payer: Medicare Other | Admitting: Family Medicine

## 2012-08-23 VITALS — BP 142/84 | HR 110 | Ht 73.0 in | Wt 198.0 lb

## 2012-08-23 DIAGNOSIS — E119 Type 2 diabetes mellitus without complications: Secondary | ICD-10-CM

## 2012-08-23 DIAGNOSIS — E785 Hyperlipidemia, unspecified: Secondary | ICD-10-CM | POA: Insufficient documentation

## 2012-08-23 MED ORDER — GLIPIZIDE 5 MG PO TABS: 7.5000 mg | ORAL_TABLET | Freq: Two times a day (BID) | ORAL | Status: DC

## 2012-08-23 MED ORDER — LISINOPRIL 2.5 MG PO TABS: 2.5000 mg | ORAL_TABLET | Freq: Every day | ORAL | Status: DC

## 2012-08-23 MED ORDER — LOVASTATIN 20 MG PO TABS: 20.0000 mg | ORAL_TABLET | Freq: Every day | ORAL | Status: DC

## 2012-08-23 NOTE — Patient Instructions (Addendum)
Stop Glynase, in its place use glipizide. It is a 5 mg tablet. He will take 1-1/2 tablets twice daily.  Your morning sugars should be under 120. If you check your sugars later in the day 2 hours after a meal our goal is less than 150.  In 3 months we will want to recheck your hemoglobin A1c in 6 months we will do a comprehensive blood work in checkup.  Lisinopril is a low dose to protect your kidneys, use one daily.  Continue your other medicines as ordered and if you have any problems or questions in the meantime please give [place] a call.Diabetes Meal Planning Guide The diabetes meal planning guide is a tool to help you plan your meals and snacks. It is important for people with diabetes to manage their blood glucose (sugar) levels. Choosing the right foods and the right amounts throughout your day will help control your blood glucose. Eating right can even help you improve your blood pressure and reach or maintain a healthy weight. CARBOHYDRATE COUNTING MADE EASY When you eat carbohydrates, they turn to sugar. This raises your blood glucose level. Counting carbohydrates can help you control this level so you feel better. When you plan your meals by counting carbohydrates, you can have more flexibility in what you eat and balance your medicine with your food intake. Carbohydrate counting simply means adding up the total amount of carbohydrate grams in your meals and snacks. Try to eat about the same amount at each meal. Foods with carbohydrates are listed below. Each portion below is 1 carbohydrate serving or 15 grams of carbohydrates. Ask your dietician how many grams of carbohydrates you should eat at each meal or snack. Grains and Starches  1 slice bread.   English muffin or hotdog/hamburger bun.   cup cold cereal (unsweetened).   cup cooked pasta or rice.   cup starchy vegetables (corn, potatoes, peas, beans, winter squash).  1 tortilla (6 inches).   bagel.  1 waffle or pancake  (size of a CD).   cup cooked cereal.  4 to 6 small crackers. *Whole grain is recommended. Fruit  1 cup fresh unsweetened berries, melon, papaya, pineapple.  1 small fresh fruit.   banana or mango.   cup fruit juice (4 oz unsweetened).   cup canned fruit in natural juice or water.  2 tbs dried fruit.  12 to 15 grapes or cherries. Milk and Yogurt  1 cup fat-free or 1% milk.  1 cup soy milk.  6 oz light yogurt with sugar-free sweetener.  6 oz low-fat soy yogurt.  6 oz plain yogurt. Vegetables  1 cup raw or  cup cooked is counted as 0 carbohydrates or a "free" food.  If you eat 3 or more servings at 1 meal, count them as 1 carbohydrate serving. Other Carbohydrates   oz chips or pretzels.   cup ice cream or frozen yogurt.   cup sherbet or sorbet.  2 inch square cake, no frosting.  1 tbs honey, sugar, jam, jelly, or syrup.  2 small cookies.  3 squares of graham crackers.  3 cups popcorn.  6 crackers.  1 cup broth-based soup.  Count 1 cup casserole or other mixed foods as 2 carbohydrate servings.  Foods with less than 20 calories in a serving may be counted as 0 carbohydrates or a "free" food. You may want to purchase a book or computer software that lists the carbohydrate gram counts of different foods. In addition, the nutrition facts panel on  the labels of the foods you eat are a good source of this information. The label will tell you how big the serving size is and the total number of carbohydrate grams you will be eating per serving. Divide this number by 15 to obtain the number of carbohydrate servings in a portion. Remember, 1 carbohydrate serving equals 15 grams of carbohydrate. SERVING SIZES Measuring foods and serving sizes helps you make sure you are getting the right amount of food. The list below tells how big or small some common serving sizes are.  1 oz.........4 stacked dice.  3 oz........Marland KitchenDeck of cards.  1 tsp.......Marland KitchenTip of  little finger.  1 tbs......Marland KitchenMarland KitchenThumb.  2 tbs.......Marland KitchenGolf ball.   cup......Marland KitchenHalf of a fist.  1 cup.......Marland KitchenA fist. SAMPLE DIABETES MEAL PLAN Below is a sample meal plan that includes foods from the grain and starches, dairy, vegetable, fruit, and meat groups. A dietician can individualize a meal plan to fit your calorie needs and tell you the number of servings needed from each food group. However, controlling the total amount of carbohydrates in your meal or snack is more important than making sure you include all of the food groups at every meal. You may interchange carbohydrate containing foods (dairy, starches, and fruits). The meal plan below is an example of a 2000 calorie diet using carbohydrate counting. This meal plan has 17 carbohydrate servings. Breakfast  1 cup oatmeal (2 carb servings).   cup light yogurt (1 carb serving).  1 cup blueberries (1 carb serving).   cup almonds. Snack  1 large apple (2 carb servings).  1 low-fat string cheese stick. Lunch  Chicken breast salad.  1 cup spinach.   cup chopped tomatoes.  2 oz chicken breast, sliced.  2 tbs low-fat [place] dressing.  12 whole-wheat crackers (2 carb servings).  12 to 15 grapes (1 carb serving).  1 cup low-fat milk (1 carb serving). Snack  1 cup carrots.   cup hummus (1 carb serving). Dinner  3 oz broiled salmon.  1 cup brown rice (3 carb servings). Snack  1  cups steamed broccoli (1 carb serving) drizzled with 1 tsp olive oil and lemon juice.  1 cup light pudding (2 carb servings). DIABETES MEAL PLANNING WORKSHEET Your dietician can use this worksheet to help you decide how many servings of foods and what types of foods are right for you.  BREAKFAST Food Group and Servings / Carb Servings Grain/Starches __________________________________ Dairy __________________________________________ Vegetable ______________________________________ Fruit  ___________________________________________ Meat __________________________________________ Fat ____________________________________________ LUNCH Food Group and Servings / Carb Servings Grain/Starches ___________________________________ Dairy ___________________________________________ Fruit ____________________________________________ Meat ___________________________________________ Fat _____________________________________________ Laural [place] Food Group and Servings / Carb Servings Grain/Starches ___________________________________ Dairy ___________________________________________ Fruit ____________________________________________ Meat ___________________________________________ Fat _____________________________________________ SNACKS Food Group and Servings / Carb Servings Grain/Starches ___________________________________ Dairy ___________________________________________ Vegetable _______________________________________ Fruit ____________________________________________ Meat ___________________________________________ Fat _____________________________________________ DAILY TOTALS Starches _________________________ Vegetable ________________________ Fruit ____________________________ Dairy ____________________________ Meat ____________________________ Fat ______________________________ Document Released: 02/13/2005 Document Revised: 08/11/2011 Document Reviewed: 12/25/2008 Exit[employer] Patient Information 2013 [place], [place].

## 2012-08-27 NOTE — Progress Notes (Signed)
  Subjective:    Patient ID: Jesse Cordova, male    DOB: [birth year], 72 y.o.   MRN: [id number]  HPIpatient comes in today with poorly controlled diabetes. He is trying to follow a diet relatively close. He relates compliance with medicine. He denies excessive thirst but does urinate a fair amount. Denies headache chest tightness pressure pain shortness of breath. He does relate moderate hyperlipidemia he tries to follow low fried food diet and takes his medicine as directed. Also he does take low-dose lisinopril for micro-proteinuria. He is aware that he needs to do a daily foot exam plus also aware that he ought to do a yearly eye exam.  Past medical history family history were reviewed. Social history reviewed.    Review of Systemspatient denies any swelling in the legs denies foot pain denies numbness tingling dizziness. No chest tightness pressure pain no shortness of breath     Objective:   Physical Exam Vital signs were noted neck no masses sinus nontender ears normal lungs clear no crackle heart regular no murmurs abdomen soft no guarding or rebound extremities no edema skin warm dry neurologic grossly normal       Assessment & Plan:  DM type 2 (diabetes mellitus, type 2) - Plan: glipiZIDE (GLUCOTROL) 5 MG tablet, lisinopril (ZESTRIL) 2.5 MG tablet  Other and unspecified hyperlipidemia - Plan: lovastatin (MEVACOR) 20 MG tablet

## 2012-09-09 ENCOUNTER — Other Ambulatory Visit: Payer: Self-pay | Admitting: Family Medicine

## 2012-11-15 ENCOUNTER — Encounter: Payer: Self-pay | Admitting: Family Medicine

## 2012-11-15 ENCOUNTER — Ambulatory Visit (INDEPENDENT_AMBULATORY_CARE_PROVIDER_SITE_OTHER): Payer: Medicare Other | Admitting: Family Medicine

## 2012-11-15 VITALS — BP 148/78 | Temp 98.4°F | Wt 197.0 lb

## 2012-11-15 DIAGNOSIS — E119 Type 2 diabetes mellitus without complications: Secondary | ICD-10-CM

## 2012-11-15 DIAGNOSIS — I10 Essential (primary) hypertension: Secondary | ICD-10-CM

## 2012-11-15 DIAGNOSIS — E785 Hyperlipidemia, unspecified: Secondary | ICD-10-CM

## 2012-11-15 MED ORDER — BENZONATATE 100 MG PO CAPS: 100.0000 mg | ORAL_CAPSULE | Freq: Three times a day (TID) | ORAL | Status: DC | PRN

## 2012-11-15 MED ORDER — METOPROLOL SUCCINATE ER 25 MG PO TB24: 25.0000 mg | ORAL_TABLET | Freq: Every day | ORAL | Status: DC

## 2012-11-15 MED ORDER — LEVOFLOXACIN 500 MG PO TABS: 500.0000 mg | ORAL_TABLET | Freq: Every day | ORAL | Status: AC

## 2012-11-15 NOTE — Progress Notes (Signed)
  Subjective:    Patient ID: Jesse Cordova, male    DOB: [birth year], 72 y.o.   MRN: [id number]  Cough The current episode started more than 1 month ago. Associated symptoms include shortness of breath. The symptoms are aggravated by lying down. He has tried prescription cough suppressant for the symptoms. His past medical history is significant for pneumonia.   Patient also relates that his daughter had noted that the patient is having a tremor. This seems to be more pronounced when he is trying to do something such as holding a flashlight. He is not having any balance issues. Not falling. No slurred speech. Never had this problem before. No family history of it.  Diabetes doing worse with blood sugars fluctuating throughout the day but most of the time running high. Morning times anywhere between 90 and 150. No low spells. Trying eat properly. Trying to stay physically active.  Also having moderate headaches and discomforts sometimes happen late in the evening sometimes during the day cause pain and discomfort. No double vision no nausea or vomiting.  PMH diabetes FMH diabetes social doesn't smoke   Review of Systems  Respiratory: Positive for cough and shortness of breath.    patient gets a little short of breath with moderate activity but denies shortness of breath at rest or with walking. Patient with moderate coughing bringing up discolored phlegm been present for the past few months denies chest pressure or pain     Objective:   Physical Exam Eardrums normal throat is normal neck no masses lungs are clear no crackles heart is regular no murmurs pulse are normal Extremities no edema Neurologic he has a fine tremor in his hands. He has some tremor when he [place] signing his name and holding a spoon of water. He does not have any balance problems with walking and no cogwheel resistance.       Assessment & Plan:  #1 hypertension-metoprolol 25 mg XL 1 daily. Recheck blood pressure next  week #2 benign essential tremor-metoprolol hopefully will help this is minimal I doubt Parkinson's #3 headaches probably related to #1. Await followup visit. May need a scan if symptoms worsen. #4 diabetes subpar control change glipizide now used 10 mg twice a day continue Lantus. Lab work ordered Upper respiratory illness Levaquin for sinusitis daily for 10 days I doubt that his cough is related to lisinopril but this is a possibility. Hold off on x-rays currently 25 minutes spent in patient [phone]

## 2012-11-16 LAB — LIPID PANEL
Cholesterol: 99 mg/dL (ref 0–200)
LDL Cholesterol: 53 mg/dL (ref 0–99)
Total CHOL/HDL Ratio: 2.8 Ratio
VLDL: 11 mg/dL (ref 0–40)

## 2012-11-16 LAB — BASIC METABOLIC PANEL
Chloride: 104 mEq/L (ref 96–112)
Glucose, Bld: 145 mg/dL — ABNORMAL HIGH (ref 70–99)
Potassium: 4.4 mEq/L (ref 3.5–5.3)
Sodium: 137 mEq/L (ref 135–145)

## 2012-11-16 LAB — HEMOGLOBIN A1C
Hgb A1c MFr Bld: 7.3 % — ABNORMAL HIGH (ref <5.7)
Mean Plasma Glucose: 163 mg/dL — ABNORMAL HIGH (ref <117)

## 2012-11-16 LAB — HEPATIC FUNCTION PANEL
ALT: 21 U/L (ref 0–53)
Bilirubin, Direct: 0.2 mg/dL (ref 0.0–0.3)
Total Protein: 6.7 g/dL (ref 6.0–8.3)

## 2012-11-17 ENCOUNTER — Encounter: Payer: Self-pay | Admitting: Family Medicine

## 2012-11-18 ENCOUNTER — Telehealth: Payer: Self-pay | Admitting: Family Medicine

## 2012-11-18 ENCOUNTER — Other Ambulatory Visit: Payer: Self-pay | Admitting: Family Medicine

## 2012-11-18 NOTE — Telephone Encounter (Signed)
Sent patient a copy of the letter / encounter dos 11/17/12.. Mailed out 11/19/12 - closed encounter - kal

## 2012-11-20 ENCOUNTER — Other Ambulatory Visit: Payer: Self-pay | Admitting: Family Medicine

## 2012-11-24 ENCOUNTER — Ambulatory Visit (INDEPENDENT_AMBULATORY_CARE_PROVIDER_SITE_OTHER): Payer: Medicare Other | Admitting: Family Medicine

## 2012-11-24 ENCOUNTER — Encounter: Payer: Self-pay | Admitting: Family Medicine

## 2012-11-24 VITALS — BP 110/80 | HR 70 | Ht 69.0 in | Wt 194.0 lb

## 2012-11-24 DIAGNOSIS — I10 Essential (primary) hypertension: Secondary | ICD-10-CM

## 2012-11-24 NOTE — Progress Notes (Signed)
  Subjective:    Patient ID: Jesse Cordova, male    DOB: 06/18/1940, 72 y.o.   MRN: [id number]  Hypertension This is a chronic problem. The current episode started more than 1 year ago. The problem has been gradually improving since onset. The problem is controlled. There are no associated agents to hypertension. There are no known risk factors for coronary artery disease. The current treatment provides significant improvement. There are no compliance problems.   Patient is here to recheck blood pressure since starting on metoprolol. Patient states he wants his left ear checked because a insect flew in it today. He thinks he got it out. Past medical history hypertension   Review of Systems Denies chest pressure tightness pain dizziness. No passing out.    Objective:   Physical Exam Tremor is gone Blood pressure very good 110/70 Heart regular no murmurs Lungs clear Extremities no edema       Assessment & Plan:  Hypertension-much better control continue current medications Benign essential tremor now under very good control with beta blocker

## 2012-12-01 ENCOUNTER — Telehealth: Payer: Self-pay | Admitting: Family Medicine

## 2012-12-01 NOTE — Telephone Encounter (Signed)
Enc date 11/17/12 - letter printed & mailed 12/02/12

## 2013-01-05 ENCOUNTER — Encounter (INDEPENDENT_AMBULATORY_CARE_PROVIDER_SITE_OTHER): Payer: Self-pay | Admitting: *Deleted

## 2013-01-10 ENCOUNTER — Other Ambulatory Visit: Payer: Self-pay | Admitting: Family Medicine

## 2013-01-20 ENCOUNTER — Telehealth: Payer: Self-pay | Admitting: Family Medicine

## 2013-01-20 MED ORDER — GLIPIZIDE 5 MG PO TABS: 10.0000 mg | ORAL_TABLET | Freq: Two times a day (BID) | ORAL | Status: DC

## 2013-01-20 NOTE — Telephone Encounter (Signed)
Patient is to stay on 10mg  twice a day- see office note-Rx sent electronically to [facility]. Family notified.

## 2013-01-20 NOTE — Telephone Encounter (Signed)
Granddaughter states Dr. Lorin Picket changed the dosage on glipiZIDE (GLUCOTROL) 5 MG tablet TO 10mg  twice daily on his last visit November 24, 2012.  However, when the prescription was refilled on 01/10/2013 this dosage was not changed.  Does Dr. Lorin Picket want this medication changed to 10mg  twice daily or leave as is?  Please call Patient. Thanks

## 2013-01-24 ENCOUNTER — Other Ambulatory Visit: Payer: Self-pay | Admitting: *Deleted

## 2013-01-24 ENCOUNTER — Telehealth: Payer: Self-pay | Admitting: Family Medicine

## 2013-01-24 MED ORDER — GLIPIZIDE 10 MG PO TABS: 10.0000 mg | ORAL_TABLET | Freq: Two times a day (BID) | ORAL | Status: DC

## 2013-01-24 NOTE — Telephone Encounter (Signed)
Grand-daughter is stating that the script that was sent to [facility] for 10mg  on his    glipiZIDE (GLUCOTROL) 5 MG tablet    Is being sent as the original 5 mg not the 10mg  that it has been changed too and the pharmacy is stating that they either aren't getting the new script or they simply aren't reading the SIG where the change is indicated. Can we remedy this?

## 2013-01-24 NOTE — Telephone Encounter (Signed)
Discussed with stacey. Glipizide 10mg  one bid #[phone] sent to pharm.

## 2013-01-26 ENCOUNTER — Other Ambulatory Visit: Payer: Self-pay | Admitting: Family Medicine

## 2013-02-13 ENCOUNTER — Other Ambulatory Visit: Payer: Self-pay | Admitting: Family Medicine

## 2013-02-14 ENCOUNTER — Telehealth: Payer: Self-pay | Admitting: Family Medicine

## 2013-02-14 NOTE — Telephone Encounter (Signed)
Patients BS has been running high and Stacey-granddaughter-is calling to see if maybe his meds need to be adjusted. Please advise.

## 2013-02-14 NOTE — Telephone Encounter (Signed)
Patient due a diabetic check up. Patient to call back and schedule a follow up office visit for later this week or next week.

## 2013-02-23 ENCOUNTER — Ambulatory Visit (INDEPENDENT_AMBULATORY_CARE_PROVIDER_SITE_OTHER): Payer: Medicare Other | Admitting: Family Medicine

## 2013-02-23 ENCOUNTER — Encounter: Payer: Self-pay | Admitting: Family Medicine

## 2013-02-23 VITALS — BP 132/80 | Ht 73.0 in | Wt 204.0 lb

## 2013-02-23 DIAGNOSIS — E785 Hyperlipidemia, unspecified: Secondary | ICD-10-CM

## 2013-02-23 DIAGNOSIS — I10 Essential (primary) hypertension: Secondary | ICD-10-CM

## 2013-02-23 DIAGNOSIS — E119 Type 2 diabetes mellitus without complications: Secondary | ICD-10-CM

## 2013-02-23 LAB — POCT GLYCOSYLATED HEMOGLOBIN (HGB A1C): Hemoglobin A1C: 7.7

## 2013-02-23 MED ORDER — CANAGLIFLOZIN 100 MG PO TABS: 1.0000 | ORAL_TABLET | Freq: Every morning | ORAL | Status: DC

## 2013-02-23 MED ORDER — SILVER SULFADIAZINE 1 % EX CREA: 1.0000 "application " | TOPICAL_CREAM | Freq: Two times a day (BID) | CUTANEOUS | Status: DC

## 2013-02-23 NOTE — Progress Notes (Signed)
  Subjective:    Patient ID: Jesse Cordova, male    DOB: 03-Jan-1941, 72 y.o.   MRN: [id number]  HPIHere for a diabetes check. Patient states his blood sugar numbers are up and down. He test one time a day. He brought his readings in today.  We discussed in detail his diabetes. Discussed importance of watching diet how to manage his long-acting insulin we also talked about the fact that his pills alone with long-acting insulin not doing enough recommended additional insulin patient does not want to do this currently he was wondering if there is a different pill that could be The patient was seen today as part of a comprehensive diabetic check up. The patient had the following elements completed: -Review of medication compliance -Review of glucose monitoring results -Review of any complications do to high or low sugars -Diabetic foot exam was completed as part of today's visit. The following was also discussed: -Importance of yearly eye exams -Importance of following diabetic/low sugar-starch diet -Importance of exercise and regular activity -Importance of regular followup visits. -Most recent hemoglobin A1c were reviewed with the patient along with goals regarding diabetes. Patient denies any chest tightness pressure pain shortness of breath. He denies any wheezing difficulty breathing. No nausea or vomiting. Refill on silvadene cream.  He has a past medical history of diabetes hypertension hyperlipidemia He does do I exam on a regular basis. Family history covered Review of Systems See above.    Objective:   Physical Exam Eardrums normal throat is normal neck no masses lungs are clear no crackles heart is regular abdomen is soft no guarding rebound or tenderness extremities no edema skin warm dry neurologic exam grossly normal diabetic foot exam completed       Assessment & Plan:  Diabetes subpar control. Start Invokana 100 mg daily. Recheck A1c in 3 months Hyperlipidemia check lab  work may need medication regarding this HTN good control currently continue current measures. Refill on Silvadene given per patient request Reflux under good control denies any problems currently continue medication A1c in 3 months

## 2013-02-25 ENCOUNTER — Encounter: Payer: Self-pay | Admitting: Family Medicine

## 2013-03-08 ENCOUNTER — Other Ambulatory Visit: Payer: Self-pay | Admitting: Family Medicine

## 2013-03-17 ENCOUNTER — Ambulatory Visit (INDEPENDENT_AMBULATORY_CARE_PROVIDER_SITE_OTHER): Payer: Medicare Other

## 2013-03-17 DIAGNOSIS — Z23 Encounter for immunization: Secondary | ICD-10-CM

## 2013-03-28 ENCOUNTER — Ambulatory Visit (INDEPENDENT_AMBULATORY_CARE_PROVIDER_SITE_OTHER): Payer: Medicare Other | Admitting: Family Medicine

## 2013-03-28 ENCOUNTER — Encounter: Payer: Self-pay | Admitting: Family Medicine

## 2013-03-28 VITALS — BP 120/76 | Temp 97.5°F | Ht 73.0 in | Wt 197.1 lb

## 2013-03-28 DIAGNOSIS — J019 Acute sinusitis, unspecified: Secondary | ICD-10-CM

## 2013-03-28 MED ORDER — BENZONATATE 100 MG PO CAPS: 100.0000 mg | ORAL_CAPSULE | Freq: Four times a day (QID) | ORAL | Status: DC | PRN

## 2013-03-28 MED ORDER — AZITHROMYCIN 250 MG PO TABS: ORAL_TABLET | ORAL | Status: DC

## 2013-03-28 NOTE — Progress Notes (Signed)
  Subjective:    Patient ID: Jesse Cordova, male    DOB: 01/26/1941, 72 y.o.   MRN: [id number]  Cough This is a new problem. The current episode started in the past 7 days. The problem occurs constantly. The cough is non-productive. Associated symptoms include chills. Associated symptoms comments: congestion. Nothing aggravates the symptoms. He has tried prescription cough suppressant for the symptoms. The treatment provided mild relief.  low grade fever yesterday No wheeze no vomiting Low energy Mild headache pmh-diabetes control good Review of Systems  Constitutional: Positive for chills.  Respiratory: Positive for cough.    PMH benign, diabetes    Objective:   Physical Exam  Vitals reviewed. Constitutional: He appears well-developed. No distress.  HENT:  Head: Atraumatic.  Right Ear: External ear normal.  Left Ear: External ear normal.  Nose: Nose normal.  Mouth/Throat: No oropharyngeal exudate.  Neck: Neck supple.  Cardiovascular: Normal rate, regular rhythm and normal heart sounds.   No murmur heard. Pulmonary/Chest: Effort normal and breath sounds normal. No respiratory distress.  Lymphadenopathy:    He has no cervical adenopathy.  Skin: He is not diaphoretic.          Assessment & Plan:  Viral URI along with secondary acute sinusitis no sign of any type of pneumonia. Antibiotics prescribed warning signs discussed.

## 2013-03-31 ENCOUNTER — Telehealth: Payer: Self-pay | Admitting: Family Medicine

## 2013-03-31 ENCOUNTER — Other Ambulatory Visit: Payer: Self-pay | Admitting: *Deleted

## 2013-03-31 MED ORDER — CEFPROZIL 500 MG PO TABS: 500.0000 mg | ORAL_TABLET | Freq: Two times a day (BID) | ORAL | Status: DC

## 2013-03-31 NOTE — Telephone Encounter (Signed)
Patient is still having cough, sore throat, chest congestion and coughing so hard his chest hurts. Please advise.  [facility]

## 2013-03-31 NOTE — Telephone Encounter (Signed)
Discussed with wife. Med sent to pharm.

## 2013-03-31 NOTE — Telephone Encounter (Signed)
This patient would best be served by Cefzil 500 mg 1 twice a day for 10 days if ongoing troubles I recommend following up if worsening call back for office visit obviously go to ER if emergency

## 2013-04-18 ENCOUNTER — Other Ambulatory Visit: Payer: Self-pay | Admitting: Family Medicine

## 2013-04-23 ENCOUNTER — Other Ambulatory Visit: Payer: Self-pay | Admitting: Family Medicine

## 2013-05-02 ENCOUNTER — Ambulatory Visit (INDEPENDENT_AMBULATORY_CARE_PROVIDER_SITE_OTHER): Payer: Medicare Other | Admitting: Family Medicine

## 2013-05-02 ENCOUNTER — Ambulatory Visit (HOSPITAL_COMMUNITY)
Admission: RE | Admit: 2013-05-02 | Discharge: 2013-05-02 | Disposition: A | Discharge status: Home / Self Care | Payer: Medicare Other | Source: Ambulatory Visit | Attending: Family Medicine | Admitting: Family Medicine

## 2013-05-02 ENCOUNTER — Encounter: Payer: Self-pay | Admitting: Family Medicine

## 2013-05-02 VITALS — BP 120/74 | Temp 98.5°F | Ht 73.0 in | Wt 194.6 lb

## 2013-05-02 DIAGNOSIS — Z8701 Personal history of pneumonia (recurrent): Secondary | ICD-10-CM | POA: Insufficient documentation

## 2013-05-02 DIAGNOSIS — R05 Cough: Secondary | ICD-10-CM | POA: Insufficient documentation

## 2013-05-02 DIAGNOSIS — E785 Hyperlipidemia, unspecified: Secondary | ICD-10-CM | POA: Insufficient documentation

## 2013-05-02 DIAGNOSIS — E119 Type 2 diabetes mellitus without complications: Secondary | ICD-10-CM | POA: Insufficient documentation

## 2013-05-02 DIAGNOSIS — R059 Cough, unspecified: Secondary | ICD-10-CM | POA: Insufficient documentation

## 2013-05-02 MED ORDER — BENZONATATE 200 MG PO CAPS: 200.0000 mg | ORAL_CAPSULE | Freq: Three times a day (TID) | ORAL | Status: DC | PRN

## 2013-05-02 MED ORDER — LEVOFLOXACIN 500 MG PO TABS: 500.0000 mg | ORAL_TABLET | Freq: Every day | ORAL | Status: AC

## 2013-05-02 NOTE — Progress Notes (Signed)
   Subjective:    Patient ID: Jesse Cordova, male    DOB: 1940/09/25, 73 y.o.   MRN: [id number]  Cough This is a recurrent problem. The current episode started more than 1 month ago. The problem has been waxing and waning. The problem occurs every few minutes. The cough is non-productive. Associated symptoms include nasal congestion and postnasal drip. The symptoms are aggravated by cold air, pollens and lying down. He has tried prescription cough suppressant for the symptoms. The treatment provided no relief.    Aching in chest with bad coughing, pain in chest sharp, transient lasting a few seconds with a hard cough, night time, notice no wheezing  History of pneumonia.  Diminished energy. Cough productive on occasion.  Review of Systems  HENT: Positive for postnasal drip.   Respiratory: Positive for cough.        Objective:   Physical Exam Alert talkative no apparent distress. Vitals stable. HEENT sinus congestion. Lungs no wheezes or crackles heart regular in rhythm.       Assessment & Plan:  Impression #1 persistent bronchitis with an element of fatigue plan chest x-ray with history of pneumonia and family concerns. Levaquin daily 10 days. Since Medicare discussed. W[facility]

## 2013-06-06 ENCOUNTER — Other Ambulatory Visit: Payer: Self-pay | Admitting: Family Medicine

## 2013-06-15 ENCOUNTER — Other Ambulatory Visit: Payer: Self-pay | Admitting: Family Medicine

## 2013-06-15 NOTE — Telephone Encounter (Signed)
Last office visit 05/02/13 for cough

## 2013-06-21 ENCOUNTER — Telehealth: Payer: Self-pay | Admitting: Family Medicine

## 2013-06-21 DIAGNOSIS — Z125 Encounter for screening for malignant neoplasm of prostate: Secondary | ICD-10-CM

## 2013-06-21 DIAGNOSIS — E119 Type 2 diabetes mellitus without complications: Secondary | ICD-10-CM

## 2013-06-21 DIAGNOSIS — E782 Mixed hyperlipidemia: Secondary | ICD-10-CM

## 2013-06-21 DIAGNOSIS — Z79899 Other long term (current) drug therapy: Secondary | ICD-10-CM

## 2013-06-21 NOTE — Telephone Encounter (Signed)
Does patient need order for blood work? °

## 2013-06-22 NOTE — Telephone Encounter (Signed)
Lipid, liver, hemoglobin U9N, metabolic 7, PSA. Urine micro-protein not needed on this collection

## 2013-06-22 NOTE — Telephone Encounter (Signed)
Blood work orders placed in Epic. Patient notified. 

## 2013-06-24 LAB — BASIC METABOLIC PANEL
BUN: 15 mg/dL (ref 6–23)
CO2: 21 mEq/L (ref 19–32)
Calcium: 9.1 mg/dL (ref 8.4–10.5)
Chloride: 106 mEq/L (ref 96–112)
Creat: 1.18 mg/dL (ref 0.50–1.35)
GLUCOSE: 100 mg/dL — AB (ref 70–99)
POTASSIUM: 3.9 meq/L (ref 3.5–5.3)
Sodium: 139 mEq/L (ref 135–145)

## 2013-06-24 LAB — HEPATIC FUNCTION PANEL
ALBUMIN: 4.1 g/dL (ref 3.5–5.2)
ALK PHOS: 54 U/L (ref 39–117)
ALT: 20 U/L (ref 0–53)
AST: 22 U/L (ref 0–37)
BILIRUBIN INDIRECT: 0.6 mg/dL (ref 0.0–0.9)
BILIRUBIN TOTAL: 0.8 mg/dL (ref 0.3–1.2)
Bilirubin, Direct: 0.2 mg/dL (ref 0.0–0.3)
TOTAL PROTEIN: 7.2 g/dL (ref 6.0–8.3)

## 2013-06-24 LAB — LIPID PANEL
Cholesterol: 117 mg/dL (ref 0–200)
HDL: 37 mg/dL — ABNORMAL LOW (ref >39)
LDL CALC: 70 mg/dL (ref 0–99)
Total CHOL/HDL Ratio: 3.2 Ratio
Triglycerides: 49 mg/dL (ref <150)
VLDL: 10 mg/dL (ref 0–40)

## 2013-06-24 LAB — PSA, MEDICARE: PSA: 0.99 ng/mL (ref <=4.00)

## 2013-06-24 LAB — HEMOGLOBIN A1C
Hgb A1c MFr Bld: 7.1 % — ABNORMAL HIGH (ref <5.7)
MEAN PLASMA GLUCOSE: 157 mg/dL — AB (ref <117)

## 2013-06-29 ENCOUNTER — Ambulatory Visit (INDEPENDENT_AMBULATORY_CARE_PROVIDER_SITE_OTHER): Payer: Medicare Other | Admitting: Family Medicine

## 2013-06-29 ENCOUNTER — Encounter: Payer: Self-pay | Admitting: Family Medicine

## 2013-06-29 VITALS — BP 130/78 | Ht 73.0 in | Wt 195.8 lb

## 2013-06-29 DIAGNOSIS — E119 Type 2 diabetes mellitus without complications: Secondary | ICD-10-CM

## 2013-06-29 DIAGNOSIS — I10 Essential (primary) hypertension: Secondary | ICD-10-CM

## 2013-06-29 DIAGNOSIS — E785 Hyperlipidemia, unspecified: Secondary | ICD-10-CM

## 2013-06-29 NOTE — Progress Notes (Signed)
   Subjective:    Patient ID: Jesse Cordova, male    DOB: [birth year], 73 y.o.   MRN: [id number]  HPI The patient was seen today as part of a comprehensive diabetic check up. The patient had the following elements completed: -Review of medication compliance -Review of glucose monitoring results -Review of any complications do to high or low sugars -Diabetic foot exam was completed as part of today's visit. The following was also discussed: -Importance of yearly eye exams -Importance of following diabetic/low sugar-starch diet -Importance of exercise and regular activity -Importance of regular followup visits. -Most recent hemoglobin A1c were reviewed with the patient along with goals regarding diabetes.  Patient arrives for a follow up on diabetes and to discuss recent lab results.  Review of Systems  Constitutional: Negative for activity change, appetite change and fatigue.  Respiratory: Negative for choking, chest tightness and shortness of breath.   Cardiovascular: Negative for chest pain.  Gastrointestinal: Negative for abdominal pain.  Endocrine: Negative for polydipsia and polyphagia.  Neurological: Negative for weakness.  Psychiatric/Behavioral: Negative for confusion.       Objective:   Physical Exam  Vitals reviewed. Constitutional: He appears well-nourished. No distress.  Cardiovascular: Normal rate, regular rhythm and normal heart sounds.   No murmur heard. Pulmonary/Chest: Effort normal and breath sounds normal. No respiratory distress.  Musculoskeletal: He exhibits no edema.  Lymphadenopathy:    He has no cervical adenopathy.  Neurological: He is alert.  Psychiatric: His behavior is normal.          Assessment & Plan:  #1 diabetes under fairly good control continue current measures it is staying near the goal that we would like which is 7.0 watch diet stay physically active  Hyperlipidemia good results currently continue current measures watch diet  continue medications  HTN good control continue current measures  Prostate exam normal PSA normal. Up-to-date on colonoscopy Recheck A1c in 3-4 months

## 2013-07-08 ENCOUNTER — Other Ambulatory Visit: Payer: Self-pay | Admitting: Family Medicine

## 2013-07-18 ENCOUNTER — Other Ambulatory Visit: Payer: Self-pay | Admitting: Family Medicine

## 2013-08-22 ENCOUNTER — Other Ambulatory Visit: Payer: Self-pay | Admitting: Family Medicine

## 2013-08-26 ENCOUNTER — Other Ambulatory Visit: Payer: Self-pay | Admitting: Family Medicine

## 2013-09-25 ENCOUNTER — Other Ambulatory Visit: Payer: Self-pay | Admitting: Family Medicine

## 2013-09-28 ENCOUNTER — Encounter: Payer: Self-pay | Admitting: Family Medicine

## 2013-09-28 ENCOUNTER — Ambulatory Visit (INDEPENDENT_AMBULATORY_CARE_PROVIDER_SITE_OTHER): Payer: Medicare Other | Admitting: Family Medicine

## 2013-09-28 VITALS — BP 122/78 | Ht 73.0 in | Wt 195.0 lb

## 2013-09-28 DIAGNOSIS — E785 Hyperlipidemia, unspecified: Secondary | ICD-10-CM

## 2013-09-28 DIAGNOSIS — I10 Essential (primary) hypertension: Secondary | ICD-10-CM

## 2013-09-28 DIAGNOSIS — E119 Type 2 diabetes mellitus without complications: Secondary | ICD-10-CM

## 2013-09-28 LAB — POCT GLYCOSYLATED HEMOGLOBIN (HGB A1C): Hemoglobin A1C: 6.1

## 2013-09-28 MED ORDER — CANAGLIFLOZIN 100 MG PO TABS: 1.0000 | ORAL_TABLET | Freq: Every morning | ORAL | Status: DC

## 2013-09-28 MED ORDER — LISINOPRIL 2.5 MG PO TABS: ORAL_TABLET | ORAL | Status: DC

## 2013-09-28 MED ORDER — LOVASTATIN 20 MG PO TABS: ORAL_TABLET | ORAL | Status: DC

## 2013-09-28 MED ORDER — LANSOPRAZOLE 15 MG PO CPDR: 15.0000 mg | DELAYED_RELEASE_CAPSULE | Freq: Every day | ORAL | Status: DC

## 2013-09-28 MED ORDER — GLIPIZIDE 5 MG PO TABS: 5.0000 mg | ORAL_TABLET | Freq: Two times a day (BID) | ORAL | Status: DC

## 2013-09-28 MED ORDER — METOPROLOL SUCCINATE ER 25 MG PO TB24
25.0000 mg | ORAL_TABLET | Freq: Every day | ORAL | Status: DC
Start: 2013-09-28 — End: 2014-10-05

## 2013-09-28 NOTE — Progress Notes (Signed)
   Subjective:    Patient ID: Jesse Cordova, male    DOB: 12-03-1940, 73 y.o.   MRN: [id number]  Diabetes He presents for his follow-up diabetic visit. He has type 2 diabetes mellitus. Pertinent negatives for hypoglycemia include no confusion or headaches. Pertinent negatives for diabetes include no chest pain, no fatigue, no polydipsia, no polyphagia and no weakness. He is following a generally healthy diet. His breakfast blood glucose range is generally 70-90 mg/dl. He does not see a podiatrist.Eye exam is current.   Discussion was held with patient how to recognize hypoglycemia also about the need to reduce his medication discussion was also held regarding the stress of taking care of his wife. Patient not depressed keeps a good mood overall Patient does relate reflux is been under good control he does try to keep his blood pressure control and takes cholesterol medicine and tries to eat healthy.  Review of Systems  Constitutional: Negative for activity change, appetite change and fatigue.  HENT: Negative for congestion.   Respiratory: Negative for cough and shortness of breath.   Cardiovascular: Negative for chest pain.  Gastrointestinal: Negative for abdominal pain.  Endocrine: Negative for polydipsia and polyphagia.  Genitourinary: Negative for difficulty urinating.  Neurological: Negative for weakness, light-headedness and headaches.  Psychiatric/Behavioral: Negative for confusion.       Objective:   Physical Exam  Vitals reviewed. Constitutional: He appears well-nourished. No distress.  Cardiovascular: Normal rate, regular rhythm and normal heart sounds.   No murmur heard. Pulmonary/Chest: Effort normal and breath sounds normal. No respiratory distress.  Musculoskeletal: He exhibits no edema.  Lymphadenopathy:    He has no cervical adenopathy.  Neurological: He is alert.  Psychiatric: His behavior is normal.          Assessment & Plan:  1. Diabetes His diabetes is  overly aggressively controlled he needs to reduce his medication 5 mg twice a day would be better continue Lantus at nighttime and also Invokana we will recheck all this again in about 4 months time - POCT glycosylated hemoglobin (Hb A1C)  2. Essential hypertension, benign Blood pressure under good control healthy diet recommended. Regular physical activity.  3. DM type 2 (diabetes mellitus, type 2) See above.  4. Other and unspecified hyperlipidemia Continue cholesterol medicine does not appear to be causing any side effects check lab work on next visit.

## 2013-10-04 ENCOUNTER — Other Ambulatory Visit: Payer: Self-pay | Admitting: Family Medicine

## 2013-10-04 NOTE — Telephone Encounter (Signed)
Last seen 09/28/13

## 2013-10-04 NOTE — Telephone Encounter (Signed)
May refill x4 

## 2013-10-10 ENCOUNTER — Other Ambulatory Visit: Payer: Self-pay | Admitting: Family Medicine

## 2013-10-25 ENCOUNTER — Other Ambulatory Visit: Payer: Self-pay | Admitting: Family Medicine

## 2013-11-13 ENCOUNTER — Other Ambulatory Visit: Payer: Self-pay | Admitting: Family Medicine

## 2013-11-21 LAB — HM DIABETES EYE EXAM

## 2013-12-20 ENCOUNTER — Ambulatory Visit (INDEPENDENT_AMBULATORY_CARE_PROVIDER_SITE_OTHER): Payer: Medicare Other | Admitting: *Deleted

## 2013-12-20 DIAGNOSIS — Z23 Encounter for immunization: Secondary | ICD-10-CM

## 2014-01-03 ENCOUNTER — Encounter (HOSPITAL_COMMUNITY): Payer: Self-pay | Admitting: Pharmacy Technician

## 2014-01-03 NOTE — Patient Instructions (Signed)
Your procedure is scheduled on: 01/05/2014  Report to [facility] at  76  AM.  Call this number if you have problems the morning of surgery: [phone]   Do not eat food or drink liquids :After Midnight.      Take these medicines the morning of surgery with A SIP OF WATER: tessalon, flexaril, lodine, prevacid, lisinopril, metoprolol   Do not wear jewelry, make-up or nail polish.  Do not wear lotions, powders, or perfumes.   Do not shave 48 hours prior to surgery.  Do not bring valuables to the hospital.  Contacts, dentures or bridgework may not be worn into surgery.  Leave suitcase in the car. After surgery it may be brought to your room.  For patients admitted to the hospital, checkout time is 11:00 AM the day of discharge.   Patients discharged the day of surgery will not be allowed to drive home.  :     Please read over the following fact sheets that you were given: Coughing and Deep Breathing, Surgical Site Infection Prevention, Anesthesia Post-op Instructions and Care and Recovery After Surgery    Cataract A cataract is a clouding of the lens of the eye. When a lens becomes cloudy, vision is reduced based on the degree and nature of the clouding. Many cataracts reduce vision to some degree. Some cataracts make people more near-sighted as they develop. Other cataracts increase glare. Cataracts that are ignored and become worse can sometimes look white. The white color can be seen through the pupil. CAUSES   Aging. However, cataracts may occur at any age, even in newborns.   Certain drugs.   Trauma to the eye.   Certain diseases such as diabetes.   Specific eye diseases such as chronic inflammation inside the eye or a sudden attack of a rare form of glaucoma.   Inherited or acquired medical problems.  SYMPTOMS   Gradual, progressive drop in vision in the affected eye.   Severe, rapid visual loss. This most often happens when trauma is the cause.  DIAGNOSIS  To detect a  cataract, an eye doctor examines the lens. Cataracts are best diagnosed with an exam of the eyes with the pupils enlarged (dilated) by drops.  TREATMENT  For an early cataract, vision may improve by using different eyeglasses or stronger lighting. If that does not help your vision, surgery is the only effective treatment. A cataract needs to be surgically removed when vision loss interferes with your everyday activities, such as driving, reading, or watching TV. A cataract may also have to be removed if it prevents examination or treatment of another eye problem. Surgery removes the cloudy lens and usually replaces it with a substitute lens (intraocular lens, IOL).  At a time when both you and your doctor agree, the cataract will be surgically removed. If you have cataracts in both eyes, only one is usually removed at a time. This allows the operated eye to heal and be out of danger from any possible problems after surgery (such as infection or poor wound healing). In rare cases, a cataract may be doing damage to your eye. In these cases, your caregiver may advise surgical removal right away. The vast majority of people who have cataract surgery have better vision afterward. HOME CARE INSTRUCTIONS  If you are not planning surgery, you may be asked to do the following:  Use different eyeglasses.   Use stronger or brighter lighting.   Ask your eye doctor about reducing your medicine  dose or changing medicines if it is thought that a medicine caused your cataract. Changing medicines does not make the cataract go away on its own.   Become familiar with your surroundings. Poor vision can lead to injury. Avoid bumping into things on the affected side. You are at a higher risk for tripping or falling.   Exercise extreme care when driving or operating machinery.   Wear sunglasses if you are sensitive to bright light or experiencing problems with glare.  SEEK IMMEDIATE MEDICAL CARE IF:   You have a  worsening or sudden vision loss.   You notice redness, swelling, or increasing pain in the eye.   You have a fever.  Document Released: 05/19/2005 Document Revised: 05/08/2011 Document Reviewed: 01/10/2011 [facility] Patient Information 2012 [employer].PATIENT INSTRUCTIONS POST-ANESTHESIA  IMMEDIATELY FOLLOWING SURGERY:  Do not drive or operate machinery for the first twenty four hours after surgery.  Do not make any important decisions for twenty four hours after surgery or while taking narcotic pain medications or sedatives.  If you develop intractable nausea and vomiting or a severe headache please notify your doctor immediately.  FOLLOW-UP:  Please make an appointment with your surgeon as instructed. You do not need to follow up with anesthesia unless specifically instructed to do so.  WOUND CARE INSTRUCTIONS (if applicable):  Keep a dry clean dressing on the anesthesia/puncture wound site if there is drainage.  Once the wound has quit draining you may leave it open to air.  Generally you should leave the bandage intact for twenty four hours unless there is drainage.  If the epidural site drains for more than 36-48 hours please call the anesthesia department.  QUESTIONS?:  Please feel free to call your physician or the hospital operator if you have any questions, and they will be happy to assist you.

## 2014-01-04 ENCOUNTER — Encounter (HOSPITAL_COMMUNITY)
Admission: RE | Admit: 2014-01-04 | Discharge: 2014-01-04 | Disposition: A | Discharge status: Home / Self Care | Payer: Medicare Other | Source: Ambulatory Visit | Attending: Ophthalmology | Admitting: Ophthalmology

## 2014-01-04 ENCOUNTER — Encounter (HOSPITAL_COMMUNITY): Payer: Self-pay

## 2014-01-04 ENCOUNTER — Other Ambulatory Visit: Payer: Self-pay

## 2014-01-04 DIAGNOSIS — Z79899 Other long term (current) drug therapy: Secondary | ICD-10-CM | POA: Diagnosis not present

## 2014-01-04 DIAGNOSIS — H2510 Age-related nuclear cataract, unspecified eye: Secondary | ICD-10-CM | POA: Diagnosis not present

## 2014-01-04 DIAGNOSIS — E119 Type 2 diabetes mellitus without complications: Secondary | ICD-10-CM | POA: Diagnosis not present

## 2014-01-04 DIAGNOSIS — K219 Gastro-esophageal reflux disease without esophagitis: Secondary | ICD-10-CM | POA: Diagnosis not present

## 2014-01-04 DIAGNOSIS — I10 Essential (primary) hypertension: Secondary | ICD-10-CM | POA: Diagnosis not present

## 2014-01-04 HISTORY — DX: Unspecified osteoarthritis, unspecified site: M19.90

## 2014-01-04 HISTORY — DX: Gastro-esophageal reflux disease without esophagitis: K21.9

## 2014-01-04 HISTORY — DX: Essential (primary) hypertension: I10

## 2014-01-04 HISTORY — DX: Malignant (primary) neoplasm, unspecified: C80.1

## 2014-01-04 LAB — BASIC METABOLIC PANEL
ANION GAP: 14 (ref 5–15)
BUN: 23 mg/dL (ref 6–23)
CO2: 21 mEq/L (ref 19–32)
Calcium: 9.4 mg/dL (ref 8.4–10.5)
Chloride: 104 mEq/L (ref 96–112)
Creatinine, Ser: 1.34 mg/dL (ref 0.50–1.35)
GFR, EST AFRICAN AMERICAN: 59 mL/min — AB (ref >90)
GFR, EST NON AFRICAN AMERICAN: 51 mL/min — AB (ref >90)
Glucose, Bld: 197 mg/dL — ABNORMAL HIGH (ref 70–99)
POTASSIUM: 4.1 meq/L (ref 3.7–5.3)
Sodium: 139 mEq/L (ref 137–147)

## 2014-01-04 LAB — HEMOGLOBIN AND HEMATOCRIT, BLOOD
HEMATOCRIT: 47.5 % (ref 39.0–52.0)
HEMOGLOBIN: 16.3 g/dL (ref 13.0–17.0)

## 2014-01-04 MED ORDER — LIDOCAINE HCL (PF) 1 % IJ SOLN
INTRAMUSCULAR | Status: AC
  Filled 2014-01-04: qty 2

## 2014-01-04 MED ORDER — TETRACAINE HCL 0.5 % OP SOLN
OPHTHALMIC | Status: AC
  Filled 2014-01-04: qty 2

## 2014-01-04 MED ORDER — PHENYLEPHRINE HCL 2.5 % OP SOLN
OPHTHALMIC | Status: AC
  Filled 2014-01-04: qty 15

## 2014-01-04 MED ORDER — NEOMYCIN-POLYMYXIN-DEXAMETH 3.5-10000-0.1 OP SUSP
OPHTHALMIC | Status: AC
  Filled 2014-01-04: qty 5

## 2014-01-04 MED ORDER — LIDOCAINE HCL 3.5 % OP GEL
OPHTHALMIC | Status: AC
  Filled 2014-01-04: qty 1

## 2014-01-04 MED ORDER — CYCLOPENTOLATE-PHENYLEPHRINE OP SOLN OPTIME - NO CHARGE
OPHTHALMIC | Status: AC
  Filled 2014-01-04: qty 2

## 2014-01-04 NOTE — Progress Notes (Signed)
01/04/14 1430  OBSTRUCTIVE SLEEP APNEA  Have you ever been diagnosed with sleep apnea through a sleep study? No  Do you snore loudly (loud enough to be heard through closed doors)?  1  Do you often feel tired, fatigued, or sleepy during the daytime? 0  Has anyone observed you stop breathing during your sleep? 0  Do you have, or are you being treated for high blood pressure? 1  BMI more than 35 kg/m2? 0  Age over 74 years old? 1  Neck circumference greater than 40 cm/16 inches? 0  Gender: 1  Obstructive Sleep Apnea Score 4  Score 4 or greater  Results sent to PCP

## 2014-01-04 NOTE — Pre-Procedure Instructions (Signed)
Patient given information to sign up for my chart at home. 

## 2014-01-05 ENCOUNTER — Encounter (HOSPITAL_COMMUNITY)
Admission: RE | Disposition: A | Discharge status: Home / Self Care | Payer: Self-pay | Source: Ambulatory Visit | Attending: Ophthalmology

## 2014-01-05 ENCOUNTER — Ambulatory Visit (HOSPITAL_COMMUNITY): Payer: Medicare Other | Admitting: Anesthesiology

## 2014-01-05 ENCOUNTER — Encounter (HOSPITAL_COMMUNITY): Payer: Self-pay | Admitting: *Deleted

## 2014-01-05 ENCOUNTER — Ambulatory Visit (HOSPITAL_COMMUNITY)
Admission: RE | Admit: 2014-01-05 | Discharge: 2014-01-05 | Disposition: A | Discharge status: Home / Self Care | Payer: Medicare Other | Source: Ambulatory Visit | Attending: Ophthalmology | Admitting: Ophthalmology

## 2014-01-05 ENCOUNTER — Encounter (HOSPITAL_COMMUNITY): Payer: Medicare Other | Admitting: Anesthesiology

## 2014-01-05 DIAGNOSIS — H2510 Age-related nuclear cataract, unspecified eye: Secondary | ICD-10-CM | POA: Diagnosis not present

## 2014-01-05 DIAGNOSIS — E119 Type 2 diabetes mellitus without complications: Secondary | ICD-10-CM | POA: Insufficient documentation

## 2014-01-05 DIAGNOSIS — K219 Gastro-esophageal reflux disease without esophagitis: Secondary | ICD-10-CM | POA: Insufficient documentation

## 2014-01-05 DIAGNOSIS — Z79899 Other long term (current) drug therapy: Secondary | ICD-10-CM | POA: Insufficient documentation

## 2014-01-05 DIAGNOSIS — I10 Essential (primary) hypertension: Secondary | ICD-10-CM | POA: Diagnosis not present

## 2014-01-05 HISTORY — PX: CATARACT EXTRACTION W/PHACO: SHX586

## 2014-01-05 LAB — GLUCOSE, CAPILLARY: GLUCOSE-CAPILLARY: 122 mg/dL — AB (ref 70–99)

## 2014-01-05 SURGERY — PHACOEMULSIFICATION, CATARACT, WITH IOL INSERTION
Anesthesia: Monitor Anesthesia Care | Site: Eye | Laterality: Left

## 2014-01-05 MED ORDER — MIDAZOLAM HCL 2 MG/2ML IJ SOLN
INTRAMUSCULAR | Status: AC
  Filled 2014-01-05: qty 2

## 2014-01-05 MED ORDER — EPINEPHRINE HCL 1 MG/ML IJ SOLN
INTRAMUSCULAR | Status: AC
Start: 2014-01-05 — End: 2014-01-05
  Filled 2014-01-05: qty 1

## 2014-01-05 MED ORDER — TETRACAINE HCL 0.5 % OP SOLN
1.0000 [drp] | OPHTHALMIC | Status: AC
  Administered 2014-01-05 (×3): 1 [drp] via OPHTHALMIC

## 2014-01-05 MED ORDER — EPINEPHRINE HCL 1 MG/ML IJ SOLN
INTRAOCULAR | Status: DC | PRN
  Administered 2014-01-05: 12:00:00

## 2014-01-05 MED ORDER — POVIDONE-IODINE 5 % OP SOLN
OPHTHALMIC | Status: DC | PRN
  Administered 2014-01-05: 1 via OPHTHALMIC

## 2014-01-05 MED ORDER — PHENYLEPHRINE HCL 2.5 % OP SOLN
1.0000 [drp] | OPHTHALMIC | Status: AC
  Administered 2014-01-05 (×3): 1 [drp] via OPHTHALMIC

## 2014-01-05 MED ORDER — EPINEPHRINE HCL 1 MG/ML IJ SOLN
INTRAMUSCULAR | Status: AC
  Filled 2014-01-05: qty 1

## 2014-01-05 MED ORDER — PROVISC 10 MG/ML IO SOLN
INTRAOCULAR | Status: DC | PRN
  Administered 2014-01-05: 0.85 mL via INTRAOCULAR

## 2014-01-05 MED ORDER — BSS IO SOLN
INTRAOCULAR | Status: DC | PRN
  Administered 2014-01-05: 15 mL via INTRAOCULAR

## 2014-01-05 MED ORDER — MIDAZOLAM HCL 2 MG/2ML IJ SOLN
1.0000 mg | INTRAMUSCULAR | Status: DC | PRN
  Administered 2014-01-05: 2 mg via INTRAVENOUS

## 2014-01-05 MED ORDER — LACTATED RINGERS IV SOLN
INTRAVENOUS | Status: DC
  Administered 2014-01-05: 11:00:00 via INTRAVENOUS

## 2014-01-05 MED ORDER — FENTANYL CITRATE 0.05 MG/ML IJ SOLN
INTRAMUSCULAR | Status: AC
  Filled 2014-01-05: qty 2

## 2014-01-05 MED ORDER — LIDOCAINE 3.5 % OP GEL OPTIME - NO CHARGE
OPHTHALMIC | Status: DC | PRN
  Administered 2014-01-05: 1 [drp] via OPHTHALMIC

## 2014-01-05 MED ORDER — LIDOCAINE HCL 3.5 % OP GEL
1.0000 "application " | Freq: Once | OPHTHALMIC | Status: AC
  Administered 2014-01-05: 1 via OPHTHALMIC

## 2014-01-05 MED ORDER — LIDOCAINE HCL (PF) 1 % IJ SOLN
INTRAOCULAR | Status: DC | PRN
  Administered 2014-01-05: 12:00:00 via OPHTHALMIC

## 2014-01-05 MED ORDER — NEOMYCIN-POLYMYXIN-DEXAMETH 3.5-10000-0.1 OP SUSP
OPHTHALMIC | Status: DC | PRN
  Administered 2014-01-05: 1 [drp] via OPHTHALMIC

## 2014-01-05 MED ORDER — CYCLOPENTOLATE-PHENYLEPHRINE 0.2-1 % OP SOLN
1.0000 [drp] | OPHTHALMIC | Status: AC
  Administered 2014-01-05 (×3): 1 [drp] via OPHTHALMIC

## 2014-01-05 MED ORDER — FENTANYL CITRATE 0.05 MG/ML IJ SOLN
25.0000 ug | INTRAMUSCULAR | Status: AC
  Administered 2014-01-05: 25 ug via INTRAVENOUS

## 2014-01-05 SURGICAL SUPPLY — 33 items
CAPSULAR TENSION RING-AMO (OPHTHALMIC RELATED) IMPLANT
CLOTH BEACON ORANGE TIMEOUT ST (SAFETY) ×1 IMPLANT
EYE SHIELD UNIVERSAL CLEAR (GAUZE/BANDAGES/DRESSINGS) ×1 IMPLANT
GLOVE BIO SURGEON STRL SZ 6.5 (GLOVE) IMPLANT
GLOVE BIOGEL PI IND STRL 6.5 (GLOVE) IMPLANT
GLOVE BIOGEL PI IND STRL 7.0 (GLOVE) IMPLANT
GLOVE BIOGEL PI IND STRL 7.5 (GLOVE) IMPLANT
GLOVE BIOGEL PI INDICATOR 6.5 (GLOVE) ×1
GLOVE BIOGEL PI INDICATOR 7.0 (GLOVE)
GLOVE BIOGEL PI INDICATOR 7.5 (GLOVE)
GLOVE ECLIPSE 6.5 STRL STRAW (GLOVE) IMPLANT
GLOVE ECLIPSE 7.0 STRL STRAW (GLOVE) IMPLANT
GLOVE ECLIPSE 7.5 STRL STRAW (GLOVE) IMPLANT
GLOVE EXAM NITRILE LRG STRL (GLOVE) IMPLANT
GLOVE EXAM NITRILE MD LF STRL (GLOVE) ×1 IMPLANT
GLOVE SKINSENSE NS SZ6.5 (GLOVE)
GLOVE SKINSENSE NS SZ7.0 (GLOVE)
GLOVE SKINSENSE STRL SZ6.5 (GLOVE) IMPLANT
GLOVE SKINSENSE STRL SZ7.0 (GLOVE) IMPLANT
KIT VITRECTOMY (OPHTHALMIC RELATED) IMPLANT
PAD ARMBOARD 7.5X6 YLW CONV (MISCELLANEOUS) ×1 IMPLANT
PROC W NO LENS (INTRAOCULAR LENS)
PROC W SPEC LENS (INTRAOCULAR LENS)
PROCESS W NO LENS (INTRAOCULAR LENS) IMPLANT
PROCESS W SPEC LENS (INTRAOCULAR LENS) IMPLANT
RETRACTOR IRIS SIGHTPATH (OPHTHALMIC RELATED) ×1 IMPLANT
RING MALYGIN (MISCELLANEOUS) IMPLANT
SIGHTPATH CAT PROC W REG LENS (Ophthalmic Related) ×2 IMPLANT
SYRINGE LUER LOK 1CC (MISCELLANEOUS) ×1 IMPLANT
TAPE SURG TRANSPORE 1 IN (GAUZE/BANDAGES/DRESSINGS) IMPLANT
TAPE SURGICAL TRANSPORE 1 IN (GAUZE/BANDAGES/DRESSINGS) ×1
VISCOELASTIC ADDITIONAL (OPHTHALMIC RELATED) IMPLANT
WATER STERILE IRR 250ML POUR (IV SOLUTION) ×1 IMPLANT

## 2014-01-05 NOTE — Transfer of Care (Signed)
Immediate Anesthesia Transfer of Care Note  Patient: Jesse Cordova  Procedure(s) Performed: Procedure(s) with comments: CATARACT EXTRACTION PHACO AND INTRAOCULAR LENS PLACEMENT (IOC) (Left) - CDE:15.98  Patient Location: Short Stay  Anesthesia Type:MAC  Level of Consciousness: awake  Airway & Oxygen Therapy: Patient Spontanous Breathing  Post-op Assessment: Report given to PACU RN  Post vital signs: Reviewed and stable  Complications: No apparent anesthesia complications

## 2014-01-05 NOTE — Anesthesia Preprocedure Evaluation (Signed)
Anesthesia Evaluation  Patient identified by MRN, date of birth, ID band Patient awake    Reviewed: Allergy & Precautions, H&P , NPO status , Patient's Chart, lab work & pertinent test results, reviewed documented beta blocker date and time   History of Anesthesia Complications (+) PONV and history of anesthetic complications  Airway Mallampati: III TM Distance: >3 FB     Dental  (+) Edentulous Upper, Edentulous Lower   Pulmonary  breath sounds clear to auscultation        Cardiovascular hypertension, Pt. on home beta blockers and Pt. on medications Rhythm:Regular Rate:Normal     Neuro/Psych    GI/Hepatic GERD-  Controlled and Medicated,  Endo/Other  diabetes, Type 2, Oral Hypoglycemic Agents  Renal/GU      Musculoskeletal   Abdominal   Peds  Hematology   Anesthesia Other Findings   Reproductive/Obstetrics                           Anesthesia Physical Anesthesia Plan  ASA: III  Anesthesia Plan: MAC   Post-op Pain Management:    Induction: Intravenous  Airway Management Planned: Nasal Cannula  Additional Equipment:   Intra-op Plan:   Post-operative Plan:   Informed Consent: I have reviewed the patients History and Physical, chart, labs and discussed the procedure including the risks, benefits and alternatives for the proposed anesthesia with the patient or authorized representative who has indicated his/her understanding and acceptance.     Plan Discussed with:   Anesthesia Plan Comments:         Anesthesia Quick Evaluation

## 2014-01-05 NOTE — Op Note (Signed)
Date of Admission: 01/05/2014  Date of Surgery: 01/05/2014   Pre-Op Dx: Cataract Left Eye  Post-Op Dx: Senile Nuclear  Cataract Left  Eye,  Dx Code 366.16  Surgeon: Tonny Branch, M.D.  Assistants: None  Anesthesia: Topical with MAC  Indications: Painless, progressive loss of vision with compromise of daily activities.  Surgery: Cataract Extraction with Intraocular lens Implant Left Eye  Discription: The patient had dilating drops and viscous lidocaine placed into the Left eye in the pre-op holding area. After transfer to the operating room, a time out was performed. The patient was then prepped and draped. Beginning with a 38 degree blade a paracentesis port was made at the surgeon's 2 o'clock position. The anterior chamber was then filled with 1% non-preserved lidocaine. This was followed by filling the anterior chamber with Provisc.  A 2.46mm keratome blade was used to make a clear corneal incision at the temporal limbus.  A bent cystatome needle was used to create a continuous tear capsulotomy. Hydrodissection was performed with balanced salt solution on a Fine canula. The lens nucleus was then removed using the phacoemulsification handpiece. Residual cortex was removed with the I&A handpiece. The anterior chamber and capsular bag were refilled with Provisc. A posterior chamber intraocular lens was placed into the capsular bag with it's injector. The implant was positioned with the Kuglan hook. The Provisc was then removed from the anterior chamber and capsular bag with the I&A handpiece. Stromal hydration of the main incision and paracentesis port was performed with BSS on a Fine canula. The wounds were tested for leak which was negative. The patient tolerated the procedure well. There were no operative complications. The patient was then transferred to the recovery room in stable condition.  Complications: None  Specimen: None  EBL: None  Prosthetic device: Hoya iSert 250, power 18.5 D, SN  [id number].

## 2014-01-05 NOTE — Anesthesia Postprocedure Evaluation (Signed)
  Anesthesia Post-op Note  Patient: Jesse Cordova  Procedure(s) Performed: Procedure(s) with comments: CATARACT EXTRACTION PHACO AND INTRAOCULAR LENS PLACEMENT (IOC) (Left) - CDE:15.98  Patient Location: Short Stay  Anesthesia Type:MAC  Level of Consciousness: awake, alert  and oriented  Airway and Oxygen Therapy: Patient Spontanous Breathing  Post-op Pain: none  Post-op Assessment: Post-op Vital signs reviewed, Patient's Cardiovascular Status Stable, Respiratory Function Stable, Patent Airway and No signs of Nausea or vomiting  Post-op Vital Signs: Reviewed and stable  Last Vitals:  Filed Vitals:   01/05/14 1125  BP: 113/70  Pulse:   Temp:   Resp: 11    Complications: No apparent anesthesia complications

## 2014-01-05 NOTE — H&P (Signed)
I have reviewed the H&P, the patient was re-examined, and I have identified no interval changes in medical condition and plan of care since the history and physical of record  

## 2014-01-06 ENCOUNTER — Encounter (HOSPITAL_COMMUNITY): Payer: Self-pay | Admitting: Ophthalmology

## 2014-02-12 ENCOUNTER — Other Ambulatory Visit: Payer: Self-pay | Admitting: Family Medicine

## 2014-02-14 ENCOUNTER — Other Ambulatory Visit: Payer: Self-pay | Admitting: Family Medicine

## 2014-02-15 LAB — HM DIABETES EYE EXAM

## 2014-02-24 ENCOUNTER — Encounter: Payer: Self-pay | Admitting: Family Medicine

## 2014-03-06 ENCOUNTER — Other Ambulatory Visit: Payer: Self-pay | Admitting: Family Medicine

## 2014-03-13 ENCOUNTER — Other Ambulatory Visit: Payer: Self-pay | Admitting: Family Medicine

## 2014-03-13 NOTE — Telephone Encounter (Signed)
1 refill he must schedule office visit for his chronic issues

## 2014-03-21 ENCOUNTER — Ambulatory Visit (INDEPENDENT_AMBULATORY_CARE_PROVIDER_SITE_OTHER): Payer: Medicare Other | Admitting: *Deleted

## 2014-03-21 DIAGNOSIS — Z23 Encounter for immunization: Secondary | ICD-10-CM

## 2014-03-27 ENCOUNTER — Telehealth: Payer: Self-pay | Admitting: Family Medicine

## 2014-03-27 NOTE — Telephone Encounter (Signed)
11/16 would be fine, we can cover both issues then thank you

## 2014-03-27 NOTE — Telephone Encounter (Signed)
Daughter Jeannene Patella) was in with her mom this morning to see you. And she stated mention that Jesse Cordova was having hand trimmers to you.And you wanted him seen, also time for his diabetic checkup she wants them together and first available you had was 11/16 . I wasnt going to argue with her tried to explain two separate appointments. She stated you said you would see him for both on the same day. Is that date ok or does it need to be sooner.

## 2014-04-17 ENCOUNTER — Other Ambulatory Visit: Payer: Self-pay | Admitting: Family Medicine

## 2014-04-17 ENCOUNTER — Ambulatory Visit (INDEPENDENT_AMBULATORY_CARE_PROVIDER_SITE_OTHER): Payer: Medicare Other | Admitting: Family Medicine

## 2014-04-17 ENCOUNTER — Encounter: Payer: Self-pay | Admitting: Family Medicine

## 2014-04-17 VITALS — BP 132/68 | Ht 72.0 in | Wt 192.0 lb

## 2014-04-17 DIAGNOSIS — Z125 Encounter for screening for malignant neoplasm of prostate: Secondary | ICD-10-CM

## 2014-04-17 DIAGNOSIS — I10 Essential (primary) hypertension: Secondary | ICD-10-CM

## 2014-04-17 DIAGNOSIS — Z79899 Other long term (current) drug therapy: Secondary | ICD-10-CM

## 2014-04-17 DIAGNOSIS — E785 Hyperlipidemia, unspecified: Secondary | ICD-10-CM

## 2014-04-17 DIAGNOSIS — E118 Type 2 diabetes mellitus with unspecified complications: Secondary | ICD-10-CM

## 2014-04-17 LAB — POCT GLYCOSYLATED HEMOGLOBIN (HGB A1C): HEMOGLOBIN A1C: 6.5

## 2014-04-17 NOTE — Progress Notes (Signed)
   Subjective:    Patient ID: Jesse Cordova, male    DOB: Jan 26, 1941, 73 y.o.   MRN: [id number]  Diabetes He presents for his follow-up diabetic visit. He has type 2 diabetes mellitus. There are no hypoglycemic associated symptoms. Associated symptoms include weakness. Current diabetic treatment includes oral agent (dual therapy) and insulin injections. He is currently taking insulin at bedtime. He monitors blood glucose at home 1-2 x per day. His overall blood glucose range is 70-90 mg/dl. He does not see a podiatrist.Eye exam is current.   Patient denies chest tightness pressure pain shortness breath nausea vomiting diarrhea   Review of Systems  Neurological: Positive for weakness.  patient denies chest tightness denies shortness breath denies swelling in the legs relate some joint pain.     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

## 2014-06-06 LAB — LIPID PANEL
CHOL/HDL RATIO: 3.3 ratio
CHOLESTEROL: 120 mg/dL (ref 0–200)
HDL: 36 mg/dL — AB (ref >39)
LDL Cholesterol: 71 mg/dL (ref 0–99)
Triglycerides: 66 mg/dL (ref <150)
VLDL: 13 mg/dL (ref 0–40)

## 2014-06-06 LAB — BASIC METABOLIC PANEL
BUN: 15 mg/dL (ref 6–23)
CO2: 25 mEq/L (ref 19–32)
Calcium: 9.7 mg/dL (ref 8.4–10.5)
Chloride: 103 mEq/L (ref 96–112)
Creat: 1.16 mg/dL (ref 0.50–1.35)
GLUCOSE: 79 mg/dL (ref 70–99)
POTASSIUM: 4.3 meq/L (ref 3.5–5.3)
Sodium: 139 mEq/L (ref 135–145)

## 2014-06-06 LAB — HEPATIC FUNCTION PANEL
ALBUMIN: 4.2 g/dL (ref 3.5–5.2)
ALK PHOS: 48 U/L (ref 39–117)
ALT: 15 U/L (ref 0–53)
AST: 17 U/L (ref 0–37)
BILIRUBIN TOTAL: 1.1 mg/dL (ref 0.2–1.2)
Bilirubin, Direct: 0.2 mg/dL (ref 0.0–0.3)
Indirect Bilirubin: 0.9 mg/dL (ref 0.2–1.2)
TOTAL PROTEIN: 6.9 g/dL (ref 6.0–8.3)

## 2014-06-06 LAB — PSA: PSA: 0.81 ng/mL (ref <=4.00)

## 2014-06-06 LAB — MICROALBUMIN, URINE: MICROALB UR: 1 mg/dL (ref <2.0)

## 2014-06-08 ENCOUNTER — Encounter: Payer: Self-pay | Admitting: Family Medicine

## 2014-06-12 ENCOUNTER — Encounter: Payer: Self-pay | Admitting: Family Medicine

## 2014-06-12 ENCOUNTER — Ambulatory Visit (INDEPENDENT_AMBULATORY_CARE_PROVIDER_SITE_OTHER): Payer: Medicare Other | Admitting: Family Medicine

## 2014-06-12 VITALS — BP 122/72 | Ht 72.0 in | Wt 189.0 lb

## 2014-06-12 DIAGNOSIS — H00013 Hordeolum externum right eye, unspecified eyelid: Secondary | ICD-10-CM

## 2014-06-12 MED ORDER — CEPHALEXIN 500 MG PO CAPS: 500.0000 mg | ORAL_CAPSULE | Freq: Four times a day (QID) | ORAL | Status: DC

## 2014-06-12 NOTE — Progress Notes (Signed)
   Subjective:    Patient ID: Jesse Cordova, male    DOB: 06/22/1940, 74 y.o.   MRN: [id number]  HPI  Patient is here today for a "knot" that is in the inner corner of his right eye.  He first noticed it a couple of days before Christmas.  It is "aggravating" Patient states it seems to come and go. At times a little tender no fever with it Getting bigger   Review of Systems     Objective:   Physical Exam  Appears to have a small cyst in the right eye in the corner. It is near where the canthus is present. No sign of any severe infection the eyeball itself is normal      Assessment & Plan:  Warm compresses Antibiotics prescribed If not significantly better or gone within 2 weeks notify us we will set him up with dermatology

## 2014-06-20 ENCOUNTER — Other Ambulatory Visit: Payer: Self-pay | Admitting: Family Medicine

## 2014-08-15 ENCOUNTER — Other Ambulatory Visit: Payer: Self-pay | Admitting: Family Medicine

## 2014-08-15 ENCOUNTER — Ambulatory Visit (INDEPENDENT_AMBULATORY_CARE_PROVIDER_SITE_OTHER): Payer: Medicare Other | Admitting: Family Medicine

## 2014-08-15 ENCOUNTER — Encounter: Payer: Self-pay | Admitting: Family Medicine

## 2014-08-15 VITALS — BP 122/84 | Ht 72.0 in | Wt 193.0 lb

## 2014-08-15 DIAGNOSIS — I10 Essential (primary) hypertension: Secondary | ICD-10-CM

## 2014-08-15 DIAGNOSIS — E119 Type 2 diabetes mellitus without complications: Secondary | ICD-10-CM

## 2014-08-15 DIAGNOSIS — E785 Hyperlipidemia, unspecified: Secondary | ICD-10-CM | POA: Diagnosis not present

## 2014-08-15 LAB — POCT GLYCOSYLATED HEMOGLOBIN (HGB A1C): Hemoglobin A1C: 6.4

## 2014-08-15 NOTE — Patient Instructions (Addendum)
A1C looks good  AM goal 90 to 115  Change Glipizide to 5 mg in am and 1/2 tablet at supper  Next check in 4 to 6 months  Please call if low sugars spells  Finally don't skip meals  Send me readings in 2 months , follow in early Sept.

## 2014-08-15 NOTE — Progress Notes (Signed)
Subjective:    Patient ID: Jesse Cordova, male    DOB: [birth year], 74 y.o.   MRN: [id number]  Diabetes He presents for his follow-up diabetic visit. He has type 2 diabetes mellitus. Pertinent negatives for hypoglycemia include no confusion. Pertinent negatives for diabetes include no chest pain, no fatigue, no polydipsia, no polyphagia and no weakness. Pertinent negatives for diabetic complications include no retinopathy. Risk factors for coronary artery disease include diabetes mellitus, dyslipidemia and hypertension. Current diabetic treatment includes insulin injections and oral agent (dual therapy). He is compliant with treatment all of the time. His weight is stable. He is following a diabetic diet. He has not had a previous visit with a dietitian. He does not see a podiatrist.Eye exam is current.  Hyperlipidemia This is a chronic problem. The current episode started more than 1 year ago. The problem is controlled. Recent lipid tests were reviewed and are normal. Exacerbating diseases include diabetes. He has no history of chronic renal disease, liver disease or obesity. Factors aggravating his hyperlipidemia include fatty foods. Pertinent negatives include no chest pain, myalgias or shortness of breath. Current antihyperlipidemic treatment includes statins. The current treatment provides significant improvement of lipids. There are no compliance problems.  Risk factors for coronary artery disease include diabetes mellitus, family history and male sex.  Hypertension This is a chronic problem. The current episode started more than 1 year ago. The problem is unchanged. The problem is controlled. Pertinent negatives include no chest pain, orthopnea, peripheral edema or shortness of breath. There are no associated agents to hypertension. Risk factors for coronary artery disease include diabetes mellitus and dyslipidemia. Past treatments include ACE inhibitors. The current treatment provides significant  improvement. There are no compliance problems.  There is no history of angina or retinopathy. There is no history of chronic renal disease.  Gastrophageal Reflux He complains of heartburn. He reports no abdominal pain, no chest pain or no coughing. This is a chronic problem. The problem occurs occasionally. The problem has been resolved. The heartburn does not wake him from sleep. The heartburn does not limit his activity. Pertinent negatives include no fatigue. He has tried a PPI for the symptoms. The treatment provided significant relief.      Review of Systems  Constitutional: Negative for activity change, appetite change and fatigue.  HENT: Negative for congestion.   Respiratory: Negative for cough and shortness of breath.   Cardiovascular: Negative for chest pain and orthopnea.  Gastrointestinal: Positive for heartburn. Negative for abdominal pain.  Endocrine: Negative for polydipsia and polyphagia.  Musculoskeletal: Negative for myalgias.  Neurological: Negative for weakness.  Psychiatric/Behavioral: Negative for confusion.       Objective:   Physical Exam  Constitutional: He appears well-nourished. No distress.  Cardiovascular: Normal rate, regular rhythm and normal heart sounds.   No murmur heard. Pulmonary/Chest: Effort normal and breath sounds normal. No respiratory distress.  Abdominal: Soft. He exhibits no distension. There is no tenderness.  Musculoskeletal: He exhibits no edema.  Lymphadenopathy:    He has no cervical adenopathy.  Neurological: He is alert.  Psychiatric: His behavior is normal.  Vitals reviewed.  Foot exam normal.       Assessment & Plan:  1. Diabetes mellitus without complication Under good control. Continue everything as is except reduce glipizide he will take a 5 mg in the morning and a half at supper time- POCT glycosylated hemoglobin (Hb A1C) If he has more low sugar spells he will let us know he was also  advised not to skip any meals most  of his morning readings are in the 90-110 range. Later in the day 07/02/1939 range. His A1c looks good at 6.5. 2. Essential hypertension, benign Blood pressure under good control continue current measures watch diet closely  3. Hyperlipidemia Cholesterol in January looked very good patient reassured he's on the right track continue medication watch diet recheck lab work again in 5-6 months  Reflux under good control continue omeprazole. 25 minutes spent with patient follow-up in approximate 6 months

## 2014-08-23 ENCOUNTER — Other Ambulatory Visit: Payer: Self-pay | Admitting: Family Medicine

## 2014-09-04 ENCOUNTER — Telehealth: Payer: Self-pay | Admitting: Family Medicine

## 2014-09-04 NOTE — Telephone Encounter (Signed)
Please see the last phone message dated for today. It was accidentally closed without routing it to the nurses. Thank you

## 2014-09-04 NOTE — Telephone Encounter (Signed)
Nurses, please call the patient. According to his daughter the patient does not get enough insulin to last 90 days. Please find out how much of insulin he takes per day and limits make sure that order is sent over to [facility] that reflects the amount that he takes daily plus additional 5 units per day that he may titrate up to if necessary. That way he can get in 90 day supply. Cenestin is a 90 day supply with refills. If questions please ask me.

## 2014-09-05 MED ORDER — INSULIN GLARGINE 100 UNIT/ML SOLOSTAR PEN
PEN_INJECTOR | SUBCUTANEOUS | Status: DC
Start: 2014-09-05 — End: 2015-03-08

## 2014-09-05 NOTE — Telephone Encounter (Signed)
[name], MD at 09/04/2014 9:06 PM     Status: Signed         Nurses, please call the patient. According to his daughter the patient does not get enough insulin to last 90 days. Please find out how much of insulin he takes per day and limits make sure that order is sent over to [facility] that reflects the amount that he takes daily plus additional 5 units per day that he may titrate up to if necessary. That way he can get in 90 day supply. Cenestin is a 90 day supply with refills. If questions please ask me.

## 2014-09-05 NOTE — Addendum Note (Signed)
Addended by: Dairl Ponder on: 09/05/2014 11:30 AM   Modules accepted: Orders

## 2014-09-05 NOTE — Telephone Encounter (Addendum)
Rx updated and sent in electronically to pharmacy.

## 2014-09-22 ENCOUNTER — Other Ambulatory Visit: Payer: Self-pay | Admitting: Family Medicine

## 2014-09-22 NOTE — Telephone Encounter (Signed)
May have this and 2 refills 

## 2014-10-05 ENCOUNTER — Other Ambulatory Visit: Payer: Self-pay | Admitting: Family Medicine

## 2014-10-15 ENCOUNTER — Other Ambulatory Visit: Payer: Self-pay | Admitting: Family Medicine

## 2014-10-20 ENCOUNTER — Other Ambulatory Visit: Payer: Self-pay | Admitting: Family Medicine

## 2014-10-25 ENCOUNTER — Other Ambulatory Visit: Payer: Self-pay | Admitting: Family Medicine

## 2014-10-29 ENCOUNTER — Telehealth: Payer: Medicare Other | Admitting: Family

## 2014-10-29 ENCOUNTER — Encounter (HOSPITAL_COMMUNITY): Payer: Self-pay | Admitting: *Deleted

## 2014-10-29 ENCOUNTER — Emergency Department (HOSPITAL_COMMUNITY)
Admission: EM | Admit: 2014-10-29 | Discharge: 2014-10-29 | Disposition: A | Discharge status: Home / Self Care | Payer: Medicare Other | Source: Home / Self Care | Attending: Emergency Medicine | Admitting: Emergency Medicine

## 2014-10-29 DIAGNOSIS — B9789 Other viral agents as the cause of diseases classified elsewhere: Secondary | ICD-10-CM

## 2014-10-29 DIAGNOSIS — J329 Chronic sinusitis, unspecified: Principal | ICD-10-CM

## 2014-10-29 DIAGNOSIS — J019 Acute sinusitis, unspecified: Secondary | ICD-10-CM | POA: Diagnosis not present

## 2014-10-29 MED ORDER — PREDNISONE 50 MG PO TABS: ORAL_TABLET | ORAL | Status: DC

## 2014-10-29 MED ORDER — FLUTICASONE PROPIONATE 50 MCG/ACT NA SUSP: 2.0000 | Freq: Every day | NASAL | Status: DC

## 2014-10-29 MED ORDER — HYDROCODONE-HOMATROPINE 5-1.5 MG/5ML PO SYRP: 5.0000 mL | ORAL_SOLUTION | Freq: Four times a day (QID) | ORAL | Status: DC | PRN

## 2014-10-29 NOTE — ED Provider Notes (Signed)
CSN: [id number]     Arrival date & time 10/29/14  1206 History   First MD Initiated Contact with Patient 10/29/14 1316     Chief Complaint  Patient presents with  . Cough   (Consider location/radiation/quality/duration/timing/severity/associated sxs/prior Treatment) HPI He is a 74 year old man here with his daughter for evaluation of cough. He states he has had a chronic cough for the last 2 years, but it got worse yesterday. It is both more severe and productive. It is associated with postnasal drip and mild sore throat. He denies any fever, shortness of breath, ear pain. He has been taking Zyrtec, Tylenol, and Tessalon perles without improvement.  Past Medical History  Diagnosis Date  . Diabetes mellitus   . Hyperlipemia   . 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 Complication of anesthesia   . PONV (postoperative nausea and vomiting)   . Chronic low back pain   . Hypertension   . GERD (gastroesophageal reflux disease)   . Cancer     skin cancer  . Arthritis   . Chronic cough   . Pneumonia    Past Surgical History  Procedure Laterality Date  . Vasectomy    . Knee arthroscopy      2 right knee surgeries and 1 left knee surgery  . Esophageal biopsy  01/16/2012    Procedure: BIOPSY;  Surgeon: Rogene Houston, MD;  Location: [facility];  Service: Endoscopy;  Laterality: N/A;  . Tonsillectomy    . Cataract extraction Right 7/06  . Esophagogastroduodenoscopy    . Colonoscopy  01/29/12    abnormal repeat in one year  . Cataract extraction w/phaco Left 01/05/2014    Procedure: CATARACT EXTRACTION PHACO AND INTRAOCULAR LENS PLACEMENT (IOC);  Surgeon: Tonny Branch, MD;  Location: [facility] ORS;  Service: Ophthalmology;  Laterality: Left;  CDE:15.98   Family History  Problem Relation Age of Onset  . Diabetes Sister   . Heart disease Sister   . Depression Maternal Grandfather   . Heart disease Other   . Diabetes Other    History  Substance Use Topics  . Smoking status: Not on file  . Smokeless  tobacco: Not on file     Comment: From spouse. Wife does not smoke around him now  . Alcohol Use: No    Review of Systems As in history of present illness Allergies  Cortisone; Hydromet; and Tetanus toxoids  Home Medications   Prior to Admission medications   Medication Sig Start Date End Date Taking? Authorizing Provider  ACCU-CHEK AVIVA PLUS test strip USE AS DIRECTED 03/06/14  Yes [name], MD  aspirin EC 81 MG tablet Take 81 mg by mouth daily.   Yes Historical Provider, MD  B-D ULTRAFINE III SHORT PEN 31G X 8 MM MISC USE AS DIRECTED 02/15/14  Yes [name], MD  cyclobenzaprine (FLEXERIL) 10 MG tablet Take 10 mg by mouth 3 (three) times daily as needed for muscle spasms.   Yes Historical Provider, MD  diphenoxylate-atropine (LOMOTIL) 2.5-0.025 MG per tablet TAKE 1 TABLET BY MOUTH EVERY DAY AS NEEDED FOR DIARRHEA 09/22/14  Yes [name], MD  etodolac (LODINE) 400 MG tablet TAKE 1 TABLET BY MOUTH THREE TIMES DAILY AS NEEDED 10/16/14  Yes Scott A Luking, MD  glipiZIDE (GLUCOTROL) 5 MG tablet TAKE 1 TABLET BY MOUTH TWICE DAILY BEFORE A MEAL 10/16/14  Yes [name], MD  glyBURIDE micronized (GLYNASE) 6 MG tablet Take 6 mg by mouth 2 (two) times daily before a  meal.   Yes Historical Provider, MD  Insulin Glargine (LANTUS SOLOSTAR) 100 UNIT/ML Solostar Pen INJECT UP TO 20 UNITS SUBCUTANEOUS EVERY NIGHT AT BEDTIME AS DIRECTED BY DOCTOR 09/05/14  Yes [name], MD  INVOKANA 100 MG TABS tablet TAKE 1 TABLET BY MOUTH EVERY MORNING 10/25/14  Yes [name], MD  lisinopril (PRINIVIL,ZESTRIL) 2.5 MG tablet TAKE 1 TABLET BY MOUTH EVERY DAY 02/13/14  Yes [name], MD  lovastatin (MEVACOR) 20 MG tablet Take 20 mg by mouth at bedtime.   Yes Historical Provider, MD  metoprolol succinate (TOPROL-XL) 25 MG 24 hr tablet TAKE 1 TABLET BY MOUTH DAILY 10/05/14  Yes [name], MD  omeprazole (PRILOSEC) 20 MG capsule Take 20 mg by mouth daily.   Yes Historical Provider, MD   fluticasone (FLONASE) 50 MCG/ACT nasal spray Place 2 sprays into both nostrils daily. 10/29/14   Melony Overly, MD  HYDROcodone-homatropine (HYCODAN) 5-1.5 MG/5ML syrup Take 5 mLs by mouth every 6 (six) hours as needed for cough. 10/29/14   Melony Overly, MD  predniSONE (DELTASONE) 50 MG tablet Take 1 pill daily for 5 days. 10/29/14   Melony Overly, MD   BP 156/79 mmHg  Pulse 85  Temp(Src) 98.7 F (37.1 C) (Oral)  Resp 16  SpO2 95% Physical Exam  Constitutional: He is oriented to person, place, and time. He appears well-developed and well-nourished. No distress.  Having coughing spasms during history and exam  HENT:  Mouth/Throat: No oropharyngeal exudate.  Clear postnasal drainage seen with mild cobblestoning.  Neck: Neck supple.  Cardiovascular: Normal rate and regular rhythm.   Murmur (systolic ejection murmur at right upper sternal border) heard. Pulmonary/Chest: Effort normal and breath sounds normal. No respiratory distress. He has no wheezes. He has no rales.  Lymphadenopathy:    He has no cervical adenopathy.  Neurological: He is alert and oriented to person, place, and time.    ED Course  Procedures (including critical care time) Labs Review Labs Reviewed - No data to display  Imaging Review No results found.   MDM   1. Acute sinusitis, recurrence not specified, unspecified location    Symptomatic treatment with Zyrtec, Flonase, prednisone. Hycodan for cough. Follow-up as needed.    Melony Overly, MD 10/29/14 1355

## 2014-10-29 NOTE — ED Notes (Signed)
C/O chronic cough ever since pneumonia 2 yrs ago, but cough much worse and productive since yesterday.  Has been taking Tessalon Perles, Zyrtec, Tylenol without relief.  During assessment pt having severe coughing fit.

## 2014-10-29 NOTE — Discharge Instructions (Signed)
You have a viral sinusitis causing drainage and cough. Continue the Zyrtec daily. Start Flonase nasal spray daily. Take prednisone 1 pill daily for 5 days. Use the Hycodan cough syrup every 4 hours as needed. Do not drive while taking this medicine. Follow-up as needed.

## 2014-10-29 NOTE — Progress Notes (Signed)
We are sorry that you are not feeling well.  Here is how we plan to help!  Based on what you have shared with me it looks like you have sinusitis.  Sinusitis is inflammation and infection in the sinus cavities of the head.  Based on your presentation I believe you most likely have Acute Viral Sinusitis. This is an infection most likely caused by a virus.  There is not specific treatment for viral sinusitis other than to help you with the symptoms until the infection runs it's course.  You may use an oral decongestant such as Mucinex D or if you have glaucoma or high blood pressure use plain Mucinex.  Saline nasal spray help and can safely be used as often as needed for congestion. Spray two sprays in each nostril twice a day to help reduce your symptoms.  Some authorities believe that zinc sprays or the use of Echinacea may shorten the course of your symptoms.  Sinus infections are not as easily transmitted as other respiratory infection, however we still recommend that you avoid close contact with loved ones, especially the very young and elderly.  Remember to wash your hands thoroughly throughout the day as this is the number one way to prevent the spread of infection!  Home Care:  Only take medications as instructed by your medical team.  Complete the entire course of an antibiotic.  Do not take these medications with alcohol.  A steam or ultrasonic humidifier can help congestion.  You can place a towel over your head and breathe in the steam from hot water coming from a faucet.  Avoid close contacts especially the very young and the elderly.  Cover your mouth when you cough or sneeze.  Always remember to wash your hands.  Get Help Right Away If:  You develop worsening fever or sinus pain.  You develop a severe head ache or visual changes.  Your symptoms persist after you have completed your treatment plan.  Make sure you  Understand these instructions.  Will watch your  condition.  Will get help right away if you are not doing well or get worse.  Your e-visit answers were reviewed by a board certified advanced clinical practitioner to complete your personal care plan.  Depending on the condition, your plan could have included both over the counter or prescription medications.  If there is a problem please reply  once you have received a response from your provider.  Your safety is important to [place].  If you have drug allergies check your prescription carefully.    You can use MyChart to ask questions about today's visit, request a non-urgent call back, or ask for a work or school excuse.  You will get an e-mail in the next two days asking about your experience.  I hope that your e-visit has been valuable and will speed your recovery. Thank you for using e-visits.

## 2014-11-14 ENCOUNTER — Other Ambulatory Visit: Payer: Self-pay | Admitting: Family Medicine

## 2014-11-21 ENCOUNTER — Other Ambulatory Visit: Payer: Self-pay | Admitting: Family Medicine

## 2015-01-04 ENCOUNTER — Encounter: Payer: Self-pay | Admitting: Family Medicine

## 2015-01-04 ENCOUNTER — Ambulatory Visit (INDEPENDENT_AMBULATORY_CARE_PROVIDER_SITE_OTHER): Payer: Medicare Other | Admitting: Family Medicine

## 2015-01-04 ENCOUNTER — Telehealth: Payer: Self-pay | Admitting: Family Medicine

## 2015-01-04 VITALS — BP 118/70 | Temp 98.0°F | Ht 73.0 in | Wt 184.0 lb

## 2015-01-04 DIAGNOSIS — K5289 Other specified noninfective gastroenteritis and colitis: Secondary | ICD-10-CM

## 2015-01-04 MED ORDER — DIPHENOXYLATE-ATROPINE 2.5-0.025 MG PO TABS: 1.0000 | ORAL_TABLET | Freq: Four times a day (QID) | ORAL | Status: DC | PRN

## 2015-01-04 NOTE — Telephone Encounter (Signed)
No milk products, may incr lomotil to qid. f u if persisits

## 2015-01-04 NOTE — Telephone Encounter (Signed)
Daughter requested office visit today to make he doesn't get dehydrated. Office visit scheduled for today.

## 2015-01-04 NOTE — Telephone Encounter (Signed)
Left message to return call 

## 2015-01-04 NOTE — Telephone Encounter (Signed)
Patient has had diarrhea off and on since Monday.  He takes lomotil, but at this point it is not helping.  Please advise.    [facility]

## 2015-01-04 NOTE — Telephone Encounter (Signed)
Patient has no fever but does have cramping or pain right before he has diarrhea. Patietn has diarrhea when he eats since [place] and taking about 3 lomotil a day but it is not helping

## 2015-01-04 NOTE — Patient Instructions (Signed)
prob  iotic supplement with persisitent diarrhea  Ask your pharmacist their preference  For the good bacteria supplement

## 2015-01-04 NOTE — Progress Notes (Signed)
   Subjective:    Patient ID: Jesse Cordova, male    DOB: [birth year], 74 y.o.   MRN: [id number]  Diarrhea  This is a new problem. Episode onset: 3 days ago. The stool consistency is described as watery. The patient states that diarrhea awakens him from sleep. Associated symptoms include abdominal pain. Exacerbated by: food or liquids. Treatments tried: lomotil. The treatment provided no relief.   No recent antibiotics abd pain cramping   No vomiting   No sig nausea  Usually bm's ponce a day or so   Started 3-1/2 days ago. Patient using approximately 3 Lomotil per day. Has had tendency towards diarrhea in the past. Though this is a fairly difficult week.  Review of Systems  Gastrointestinal: Positive for abdominal pain and diarrhea.   No dysuria no fever no chills no vomiting    Objective:   Physical Exam  Alert vital signs stable pleasant no acute distress membranes moist. HEENT normal. Lungs clear. Heart rare rhythm abdomen hyperactive bowel sounds no tenderness      Assessment & Plan:  Impression gastroenteritis likely viral versus food poisoning discuss plan hold off on major workup at this point. Lomotil up to 4 times a day. Dietary changes discussed add probiotics expect improvement in the coming days [facility]

## 2015-01-04 NOTE — Telephone Encounter (Signed)
ntsw mor symtom description "off and on" how often. How often taking lomotil? Any fever any pain what's his theory as far as cause?

## 2015-01-08 ENCOUNTER — Other Ambulatory Visit: Payer: Self-pay | Admitting: Family Medicine

## 2015-01-13 ENCOUNTER — Other Ambulatory Visit: Payer: Self-pay | Admitting: Family Medicine

## 2015-02-16 ENCOUNTER — Other Ambulatory Visit: Payer: Self-pay | Admitting: Family Medicine

## 2015-02-22 ENCOUNTER — Ambulatory Visit (INDEPENDENT_AMBULATORY_CARE_PROVIDER_SITE_OTHER): Payer: Medicare Other

## 2015-02-22 DIAGNOSIS — Z23 Encounter for immunization: Secondary | ICD-10-CM | POA: Diagnosis not present

## 2015-02-28 ENCOUNTER — Other Ambulatory Visit: Payer: Self-pay | Admitting: Family Medicine

## 2015-03-07 ENCOUNTER — Telehealth: Payer: Self-pay | Admitting: Family Medicine

## 2015-03-07 NOTE — Telephone Encounter (Signed)
Pharmacy states patient is receiving a 75 day supply and they can not spit a box up to give 90 day supply and if they give another box then it is a 150 day supply and insurance will not pay. Family stated because they waste 2 units every injection per doctors order it is only lasting 2 days and they scheduled office visit to discuss changing so patient can get a 25 say supply.

## 2015-03-07 NOTE — Telephone Encounter (Signed)
pts daughter is stating that his Lantus is running out before the 90 days They are needing more lantus before script is due, they state that  You are aware of this an was going to try to remedy the situation.   He is going to be out in the next 10 days and want to make sure you  Have been working on fixing the dosage size. She says with the purging  Of at least 2 units with each shot is making them lose units, an run out early.  Call daughter if you have any questions.   [place]

## 2015-03-07 NOTE — Telephone Encounter (Signed)
Uses 20 units qhs. 90 day supply is only lasting 58 days. Pharm cannot break open box. 5 pens come in a box.

## 2015-03-08 ENCOUNTER — Ambulatory Visit (INDEPENDENT_AMBULATORY_CARE_PROVIDER_SITE_OTHER): Payer: Medicare Other | Admitting: Family Medicine

## 2015-03-08 ENCOUNTER — Encounter: Payer: Self-pay | Admitting: Family Medicine

## 2015-03-08 VITALS — Ht 73.0 in | Wt 184.0 lb

## 2015-03-08 DIAGNOSIS — E119 Type 2 diabetes mellitus without complications: Secondary | ICD-10-CM | POA: Diagnosis not present

## 2015-03-08 DIAGNOSIS — Z794 Long term (current) use of insulin: Secondary | ICD-10-CM

## 2015-03-08 LAB — POCT GLYCOSYLATED HEMOGLOBIN (HGB A1C): HEMOGLOBIN A1C: 6.1

## 2015-03-08 MED ORDER — INSULIN GLARGINE 100 UNIT/ML SOLOSTAR PEN: PEN_INJECTOR | SUBCUTANEOUS | Status: DC

## 2015-03-08 NOTE — Progress Notes (Signed)
   Subjective:    Patient ID: Jesse Cordova, male    DOB: [birth year], 74 y.o.   MRN: [id number]  HPI Patient concerned about insulin and would like to get a med he can get a 90 day supply. Pharmacy states patient is receiving a 75 day supply of Lantus    The patient at times having to use a larger amount of insulin in order to keep things under control usually he uses 22 units but sometimes requires up to 30, A1c looks good but we will arrives larger amount of insulin Review of Systems  Constitutional: Negative for activity change, appetite change and fatigue.  HENT: Negative for congestion.   Respiratory: Negative for cough.   Cardiovascular: Negative for chest pain.  Gastrointestinal: Negative for abdominal pain.  Endocrine: Negative for polydipsia and polyphagia.  Neurological: Negative for weakness.  Psychiatric/Behavioral: Negative for confusion.       Objective:   Physical Exam  Constitutional: He appears well-nourished. No distress.  Cardiovascular: Normal rate, regular rhythm and normal heart sounds.   No murmur heard. Pulmonary/Chest: Effort normal and breath sounds normal. No respiratory distress.  Musculoskeletal: He exhibits no edema.  Lymphadenopathy:    He has no cervical adenopathy.  Neurological: He is alert.  Psychiatric: His behavior is normal.  Vitals reviewed.         Assessment & Plan:  Diabetes. Due to fluctuating sugars is necessary to increase his current regimen he may use up to 30 units of the long-acting insulin to keep his morning sugars in the range 100-120 Follow-up in 3-4 months

## 2015-03-09 ENCOUNTER — Other Ambulatory Visit: Payer: Self-pay | Admitting: Family Medicine

## 2015-03-14 ENCOUNTER — Other Ambulatory Visit: Payer: Self-pay | Admitting: Family Medicine

## 2015-03-24 ENCOUNTER — Other Ambulatory Visit: Payer: Self-pay | Admitting: Family Medicine

## 2015-04-13 ENCOUNTER — Other Ambulatory Visit: Payer: Self-pay | Admitting: Family Medicine

## 2015-04-22 ENCOUNTER — Other Ambulatory Visit: Payer: Self-pay | Admitting: Family Medicine

## 2015-04-23 NOTE — Telephone Encounter (Signed)
May refill 3 

## 2015-05-10 ENCOUNTER — Other Ambulatory Visit: Payer: Self-pay | Admitting: Family Medicine

## 2015-05-14 ENCOUNTER — Telehealth: Payer: Self-pay | Admitting: Family Medicine

## 2015-05-14 DIAGNOSIS — M79673 Pain in unspecified foot: Secondary | ICD-10-CM

## 2015-05-14 NOTE — Telephone Encounter (Signed)
Called and spoke with patient's daughter Jeannene Patella and informed her per Dr.Scott Luking- Referral for Dr.Mckinney was put into epic. Also informed her that this could be diabetic neuropathy that this is a condition that we can assist with should he also want to follow up here. If so he would need to make an appointment with [place]. Patient's daughter verbalized understanding.

## 2015-05-14 NOTE — Telephone Encounter (Signed)
Pt is having a great deal pain in his feet lately and tried to make a Appt with Dr Caprice Beaver, please send a referral so he can be seen there   Needs this done as soon as possible, it hurts him to even walk at times

## 2015-05-14 NOTE — Telephone Encounter (Signed)
May go ahead and refer patient to Dr. Caprice Beaver. Please let the patient know that it is also possible that some of this could be diabetic neuropathy that this is a condition that we can assist with should he also wants to follow-up here. If so he would need to make an appointment with [place].

## 2015-05-17 ENCOUNTER — Encounter: Payer: Self-pay | Admitting: Family Medicine

## 2015-05-21 ENCOUNTER — Encounter: Payer: Self-pay | Admitting: Family Medicine

## 2015-05-21 ENCOUNTER — Ambulatory Visit (INDEPENDENT_AMBULATORY_CARE_PROVIDER_SITE_OTHER): Payer: Medicare Other | Admitting: Family Medicine

## 2015-05-21 VITALS — BP 134/72 | Ht 73.0 in | Wt 191.5 lb

## 2015-05-21 DIAGNOSIS — M722 Plantar fascial fibromatosis: Secondary | ICD-10-CM

## 2015-05-21 MED ORDER — MELOXICAM 15 MG PO TABS: 15.0000 mg | ORAL_TABLET | Freq: Every day | ORAL | Status: DC

## 2015-05-21 NOTE — Patient Instructions (Addendum)
Plantar Fasciitis Plantar fasciitis is a painful foot condition that affects the heel. It occurs when the band of tissue that connects the toes to the heel bone (plantar fascia) becomes irritated. This can happen after exercising too much or doing other repetitive activities (overuse injury). The pain from plantar fasciitis can range from mild irritation to severe pain that makes it difficult for you to walk or move. The pain is usually worse in the morning or after you have been sitting or lying down for a while. CAUSES This condition may be caused by:  Standing for long periods of time.  Wearing shoes that do not fit.  Doing high-impact activities, including running, aerobics, and ballet.  Being overweight.  Having an abnormal way of walking (gait).  Having tight calf muscles.  Having high arches in your feet.  Starting a new athletic activity. SYMPTOMS The main symptom of this condition is heel pain. Other symptoms include:  Pain that gets worse after activity or exercise.  Pain that is worse in the morning or after resting.  Pain that goes away after you walk for a few minutes. DIAGNOSIS This condition may be diagnosed based on your signs and symptoms. Your health care provider will also do a physical exam to check for:  A tender area on the bottom of your foot.  A high arch in your foot.  Pain when you move your foot.  Difficulty moving your foot. You may also need to have imaging studies to confirm the diagnosis. These can include:  X-rays.  Ultrasound.  MRI. TREATMENT  Treatment for plantar fasciitis depends on the severity of the condition. Your treatment may include:  Rest, ice, and over-the-counter pain medicines to manage your pain.  Exercises to stretch your calves and your plantar fascia.  A splint that holds your foot in a stretched, upward position while you sleep (night splint).  Physical therapy to relieve symptoms and prevent problems in the  future.  Cortisone injections to relieve severe pain.  Extracorporeal shock wave therapy (ESWT) to stimulate damaged plantar fascia with electrical impulses. It is often used as a last resort before surgery.  Surgery, if other treatments have not worked after 12 months. HOME CARE INSTRUCTIONS  Take medicines only as directed by your health care provider.  Avoid activities that cause pain.  Roll the bottom of your foot over a bag of ice or a bottle of cold water. Do this for 20 minutes, 3-4 times a day.  Perform simple stretches as directed by your health care provider.  Try wearing athletic shoes with air-sole or gel-sole cushions or soft shoe inserts.  Wear a night splint while sleeping, if directed by your health care provider.  Keep all follow-up appointments with your health care provider. PREVENTION   Do not perform exercises or activities that cause heel pain.  Consider finding low-impact activities if you continue to have problems.  Lose weight if you need to. The best way to prevent plantar fasciitis is to avoid the activities that aggravate your plantar fascia. SEEK MEDICAL CARE IF:  Your symptoms do not go away after treatment with home care measures.  Your pain gets worse.  Your pain affects your ability to move or do your daily activities.   This information is not intended to replace advice given to you by your health care provider. Make sure you discuss any questions you have with your health care provider.   Document Released: 02/11/2001 Document Revised: 02/07/2015 Document Reviewed: 03/29/2014 Elsevier   Interactive Patient Education 2016 [place].   Use Meloxicam one per day for 2 weeks  We will set appt with Dr Caprice Beaver and call you

## 2015-05-21 NOTE — Progress Notes (Signed)
   Subjective:    Patient ID: Jesse Cordova, male    DOB: [birth year], 74 y.o.   MRN: [id number]  HPI Patient in today for pain to bilateral feet. Onset of symptoms two weeks ago. States that with pressure feet have sensation of needles and pins sticking through bilateral feet.  Has diabetes under good control States no other concerns this visit.   Review of Systems Heel pain bilateral denies ankle pain denies tingling burning. Denies toe pain. Denies calf pain    Objective:   Physical Exam  Ankles normal feet diabetic exam normal pulses normal neuropathy not present Significant inflammation at the heels noted.      Assessment & Plan:  Plantar fasciitis-anti-inflammatory 1 daily on a regular basis over the course of the next few weeks then stop. In addition to that referral to podiatry recommend injections exercise sheet for stretching was shown for the patient.

## 2015-06-04 ENCOUNTER — Other Ambulatory Visit: Payer: Self-pay | Admitting: Family Medicine

## 2015-06-15 ENCOUNTER — Other Ambulatory Visit: Payer: Self-pay | Admitting: Family Medicine

## 2015-06-25 ENCOUNTER — Ambulatory Visit (INDEPENDENT_AMBULATORY_CARE_PROVIDER_SITE_OTHER): Payer: Medicare Other | Admitting: Family Medicine

## 2015-06-25 ENCOUNTER — Encounter: Payer: Self-pay | Admitting: Family Medicine

## 2015-06-25 VITALS — BP 118/78 | Temp 97.6°F | Ht 73.0 in | Wt 189.6 lb

## 2015-06-25 DIAGNOSIS — J019 Acute sinusitis, unspecified: Secondary | ICD-10-CM | POA: Diagnosis not present

## 2015-06-25 DIAGNOSIS — B9689 Other specified bacterial agents as the cause of diseases classified elsewhere: Secondary | ICD-10-CM

## 2015-06-25 MED ORDER — AZITHROMYCIN 250 MG PO TABS: ORAL_TABLET | ORAL | Status: DC

## 2015-06-25 NOTE — Patient Instructions (Signed)
Take the antibiotic  If worse as the week goes on please let me know

## 2015-06-25 NOTE — Progress Notes (Signed)
   Subjective:    Patient ID: Jesse Cordova, male    DOB: [birth year], 75 y.o.   MRN: [id number]  Cough This is a new problem. The current episode started in the past 7 days. Associated symptoms include headaches, myalgias, nasal congestion, a sore throat and wheezing. He has tried OTC cough suppressant (advil) for the symptoms.  patient states it hit him on Friday night describes body aches headache fatigue tiredness cough congestion some shortness of breath initially but this is past relates a lot of head congestion drainage sinus pressure pain    Review of Systems  HENT: Positive for sore throat.   Respiratory: Positive for cough and wheezing.   Musculoskeletal: Positive for myalgias.  Neurological: Positive for headaches.       Objective:   Physical Exam  Constitutional: He appears well-developed.  HENT:  Head: Normocephalic.  Mouth/Throat: Oropharynx is clear and moist. No oropharyngeal exudate.  Neck: Normal range of motion.  Cardiovascular: Normal rate, regular rhythm and normal heart sounds.   No murmur heard. Pulmonary/Chest: Effort normal and breath sounds normal. He has no wheezes.  Lymphadenopathy:    He has no cervical adenopathy.  Neurological: He exhibits normal muscle tone.  Skin: Skin is warm and dry.  Nursing note and vitals reviewed.    O2 sat 97% heart rate controlled     Assessment & Plan:  flulike illness viral process should gradually get better warning signs discussed Acute bronchitis secondary to the flulike illness antibiotics prescribed warning signs discussed follow-up if problems Possible sinusitis antibiotics prescribed warning signs discuss

## 2015-07-11 ENCOUNTER — Other Ambulatory Visit: Payer: Self-pay | Admitting: Family Medicine

## 2015-07-27 ENCOUNTER — Other Ambulatory Visit: Payer: Self-pay | Admitting: Family Medicine

## 2015-08-15 ENCOUNTER — Other Ambulatory Visit: Payer: Self-pay | Admitting: Family Medicine

## 2015-09-10 ENCOUNTER — Ambulatory Visit (INDEPENDENT_AMBULATORY_CARE_PROVIDER_SITE_OTHER): Payer: Medicare Other | Admitting: Family Medicine

## 2015-09-10 ENCOUNTER — Encounter: Payer: Self-pay | Admitting: Family Medicine

## 2015-09-10 ENCOUNTER — Ambulatory Visit (HOSPITAL_COMMUNITY)
Admission: RE | Admit: 2015-09-10 | Discharge: 2015-09-10 | Disposition: A | Discharge status: Home / Self Care | Payer: Medicare Other | Source: Ambulatory Visit | Attending: Family Medicine | Admitting: Family Medicine

## 2015-09-10 VITALS — BP 124/72 | Ht 73.0 in | Wt 189.4 lb

## 2015-09-10 DIAGNOSIS — M19012 Primary osteoarthritis, left shoulder: Secondary | ICD-10-CM | POA: Diagnosis not present

## 2015-09-10 DIAGNOSIS — M25512 Pain in left shoulder: Secondary | ICD-10-CM | POA: Insufficient documentation

## 2015-09-10 MED ORDER — DICLOFENAC SODIUM 75 MG PO TBEC
75.0000 mg | DELAYED_RELEASE_TABLET | Freq: Two times a day (BID) | ORAL | Status: DC
Start: 2015-09-10 — End: 2017-09-09

## 2015-09-10 NOTE — Progress Notes (Signed)
   Subjective:    Patient ID: JAMIESON CONCHA, male    DOB: [birth year], 75 y.o.   MRN: [id number]  Fall The accident occurred 3 to 5 days ago. The fall occurred while standing (walking up steps). He fell from a height of 1 to 2 ft. He landed on dirt. There was no blood loss. The pain is present in the left upper arm, left lower arm and left shoulder. The pain is at a severity of 5/10. The symptoms are aggravated by movement. He has tried NSAID (ibuprofen) for the symptoms.   No prior troubles.   Patient states no other concerns this visit. Review of Systems Relates shoulder pain discomfort denies any radiation down the arm. No numbness or tingling.    Objective:   Physical Exam Limited range of motion of the left shoulder has difficult time raising above his head and putting it behind his back apprehension test is negative. No deformity felt. Lungs clear heart regular.       Assessment & Plan:  Shoulder pain discomfort left side from a fall I believe there is some rotator cuff possible injury x-rays recommended anti-inflammatory range of motion excises if not significantly better by next week referral to Dr. Aline Brochure.

## 2015-09-11 ENCOUNTER — Telehealth: Payer: Self-pay | Admitting: Family Medicine

## 2015-09-11 DIAGNOSIS — M25512 Pain in left shoulder: Secondary | ICD-10-CM

## 2015-09-11 MED ORDER — TRAMADOL HCL 50 MG PO TABS: 50.0000 mg | ORAL_TABLET | Freq: Three times a day (TID) | ORAL | Status: DC | PRN

## 2015-09-11 NOTE — Telephone Encounter (Signed)
May have referral to Dr. Hazle Quant or [place] depending on what family once. In addition to this his allergies lists hydrocodone as causing nausea vomiting if that is the case I would recommend tramadol 50 mg 3 times a day when necessary shoulder pain #30 with 1 refill

## 2015-09-11 NOTE — Telephone Encounter (Signed)
Referral ordered in EPIC. Rx faxed to pharmacy. Family notified.

## 2015-09-11 NOTE — Telephone Encounter (Signed)
Pts granddaughter Erline Levine calling to say that he woke this morning in a great  Deal of pain. She wants to know if you can issue him something for the pain Other than the anti inflammatory that was issued. They would like to go ahead  And start working on the referral now instead of waiting.   Please advise  [place]

## 2015-09-12 ENCOUNTER — Encounter: Payer: Self-pay | Admitting: Family Medicine

## 2015-09-14 ENCOUNTER — Other Ambulatory Visit: Payer: Self-pay | Admitting: Family Medicine

## 2015-09-20 ENCOUNTER — Other Ambulatory Visit: Payer: Self-pay | Admitting: Family Medicine

## 2015-09-20 ENCOUNTER — Ambulatory Visit (INDEPENDENT_AMBULATORY_CARE_PROVIDER_SITE_OTHER): Payer: Medicare Other | Admitting: Orthopaedic Surgery

## 2015-09-20 ENCOUNTER — Encounter: Payer: Self-pay | Admitting: Orthopaedic Surgery

## 2015-09-20 VITALS — BP 157/72 | HR 72 | Temp 97.7°F | Ht 73.0 in | Wt 187.0 lb

## 2015-09-20 DIAGNOSIS — M25512 Pain in left shoulder: Secondary | ICD-10-CM | POA: Diagnosis not present

## 2015-09-20 NOTE — Progress Notes (Signed)
Subjective: my left shoulder hurts.    Patient ID: Jesse Cordova, male    DOB: September 25, 1940, 75 y.o.   MRN: [id number]  Shoulder Injury  The incident occurred at home. The left shoulder is affected. The incident occurred more than 1 week ago. The injury mechanism was a fall. The quality of the pain is described as aching. The pain does not radiate. The pain is at a severity of 4/10. The pain is moderate. Pertinent negatives include no chest pain, muscle weakness, numbness or tingling. The symptoms are aggravated by overhead lifting. He has tried heat, ice and NSAIDs for the symptoms. The treatment provided moderate relief.   He has fallen about three times in the last three months.  Most recently he had a fall about two weeks ago.  He has seen Dr. Wolfgang Phoenix.  He has pain in the left shoulder.  Each time he fell, he hurt the left shoulder.  His pain is not getting any better.  He has no redness, no paresthesias.  He has pain with overhead use.  He has not had x-rays.   Review of Systems  HENT: Negative for congestion.   Respiratory: Positive for cough. Negative for shortness of breath.   Cardiovascular: Negative for chest pain and leg swelling.  Endocrine: Negative for cold intolerance.  Musculoskeletal: Positive for back pain and arthralgias.  Allergic/Immunologic: Negative for environmental allergies.  Neurological: Negative for tingling and numbness.   Past Medical History  Diagnosis Date  . Diabetes mellitus   . Hyperlipemia   . 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 Complication of anesthesia   . PONV (postoperative nausea and vomiting)   . Chronic low back pain   . Hypertension   . GERD (gastroesophageal reflux disease)   . Cancer ([place])     skin cancer  . Arthritis   . Chronic cough   . Pneumonia     Past Surgical History  Procedure Laterality Date  . Vasectomy    . Knee arthroscopy      2 right knee surgeries and 1 left knee surgery  . Biopsy  01/16/2012    Procedure: BIOPSY;   Surgeon: Rogene Houston, MD;  Location: [facility];  Service: Endoscopy;  Laterality: N/A;  . Tonsillectomy    . Cataract extraction Right 7/06  . Esophagogastroduodenoscopy    . Colonoscopy  01/29/12    abnormal repeat in one year  . Cataract extraction w/phaco Left 01/05/2014    Procedure: CATARACT EXTRACTION PHACO AND INTRAOCULAR LENS PLACEMENT (IOC);  Surgeon: Tonny Branch, MD;  Location: [facility];  Service: Ophthalmology;  Laterality: Left;  CDE:15.98    Current Outpatient Prescriptions on File Prior to Visit  Medication Sig Dispense Refill  . ACCU-CHEK AVIVA PLUS test strip USE TO TEST BLOOD SUGAR TWICE DAILY 100 each 5  . aspirin EC 81 MG tablet Take 81 mg by mouth daily.    . B-D ULTRAFINE III SHORT PEN 31G X 8 MM MISC USE AS DIRECTED 100 each 5  . benzonatate (TESSALON) 200 MG capsule TAKE ONE (1) CAPSULE THREE (3) TIMES EACH DAY AS NEEDED 30 capsule 6  . diclofenac (VOLTAREN) 75 MG EC tablet Take 1 tablet (75 mg total) by mouth 2 (two) times daily. 30 tablet 0  . diphenoxylate-atropine (LOMOTIL) 2.5-0.025 MG per tablet TAKE 1 TABLET BY MOUTH EVERY DAY AS NEEDED FOR DIARRHEA 30 tablet 2  . diphenoxylate-atropine (LOMOTIL) 2.5-0.025 MG per tablet Take 1 tablet by mouth 4 (four) times daily  as needed for diarrhea or loose stools. 30 tablet 0  . diphenoxylate-atropine (LOMOTIL) 2.5-0.025 MG tablet TAKE 1 TABLET BY MOUTH FOUR TIMES DAILY AS NEEDED FOR DIARRHEA OR LOOSE STOOLS 30 tablet 3  . glipiZIDE (GLUCOTROL) 5 MG tablet TAKE 1 TABLET BY MOUTH TWICE DAILY BEFORE A MEAL 180 tablet 0  . Insulin Glargine (LANTUS SOLOSTAR) 100 UNIT/ML Solostar Pen INJECT UP TO 30 UNITS SUBCUTANEOUS EVERY NIGHT AT BEDTIME AS DIRECTED BY DOCTOR 6 pen 5  . INVOKANA 100 MG TABS tablet TAKE 1 TABLET BY MOUTH EVERY MORNING 90 tablet 1  . lansoprazole (PREVACID) 15 MG capsule Take 15 mg by mouth daily at 12 noon.    Marland Kitchen lisinopril (PRINIVIL,ZESTRIL) 2.5 MG tablet TAKE 1 TABLET BY MOUTH EVERY DAY 90 tablet 1  .  lovastatin (MEVACOR) 20 MG tablet TAKE 1 TABLET BY MOUTH EVERY NIGHT AT BEDTIME 90 tablet 0  . metoprolol succinate (TOPROL-XL) 25 MG 24 hr tablet TAKE 1 TABLET BY MOUTH EVERY DAY 30 tablet 5  . omeprazole (PRILOSEC) 20 MG capsule Take 20 mg by mouth daily.    . traMADol (ULTRAM) 50 MG tablet Take 1 tablet (50 mg total) by mouth every 8 (eight) hours as needed. 30 tablet 1   No current facility-administered medications on file prior to visit.    Social History   Social History  . Marital Status: Married    Spouse Name: N/A  . Number of Children: N/A  . Years of Education: N/A   Occupational History  . Not on file.   Social History Main Topics  . Smoking status: Never Smoker   . Smokeless tobacco: Not on file     Comment: From spouse. Wife does not smoke around him now  . Alcohol Use: No  . Drug Use: No  . Sexual Activity: Not on file   Other Topics Concern  . Not on file   Social History Narrative    BP 157/72 mmHg  Pulse 72  Temp(Src) 97.7 F (36.5 C)  Ht 6\' 1"  (1.854 m)  Wt 187 lb (84.823 kg)  BMI 24.68 kg/m2     Objective:   Physical Exam  Constitutional: He is oriented to person, place, and time. He appears well-developed and well-nourished.  HENT:  Head: Normocephalic and atraumatic.  Eyes: Conjunctivae and EOM are normal. Pupils are equal, round, and reactive to light.  Neck: Normal range of motion. Neck supple.  Cardiovascular: Normal rate, regular rhythm and intact distal pulses.   Pulmonary/Chest: Effort normal.  Abdominal: Soft.  Musculoskeletal: He exhibits tenderness (left shoulder tender with decreased motion.  Full ROM of right shoulder.).       Left shoulder: He exhibits decreased range of motion and tenderness. He exhibits no effusion and no crepitus.       Arms: Neurological: He is alert and oriented to person, place, and time. He has normal reflexes. No cranial nerve deficit. He exhibits normal muscle tone. Coordination normal.  Skin: Skin is  warm and dry.  Psychiatric: He has a normal mood and affect. His behavior is normal. Judgment and thought content normal.   Examination of left Upper Extremity is done.  Inspection:   Overall:  Elbow non-tender without crepitus or defects, forearm non-tender without crepitus or defects, wrist non-tender without crepitus or defects, hand non-tender.    Shoulder: with glenohumeral joint tenderness, without effusion.   Upper arm: without swelling and tenderness   Range of motion:   Overall:  Full range of motion of  the elbow, full range of motion of wrist and full range of motion in fingers.   Shoulder:  left  120 degrees forward flexion; 90 degrees abduction; 20 degrees internal rotation, 20 degrees external rotation, 10 degrees extension, 30 degrees adduction.   Stability:   Overall:  Shoulder, elbow and wrist stable   Strength and Tone:   Overall full shoulder muscles strength, full upper arm strength and normal upper arm bulk and tone.  X-rays were done of the left shoulder and reported separately.     Assessment & Plan:   Encounter Diagnosis  Name Primary?  . Left shoulder pain Yes   I have discussed with him the possibility of a rotator cuff injury.  I will inject the shoulder today, he is to begin exercises for the shoulder and begin Naprosyn.  He is to return in two weeks.  PROCEDURE NOTE:  The patient request injection, verbal consent was obtained.  The left shoulder was prepped appropriately after time out was performed.   Sterile technique was observed and injection of 1 cc of Depo-Medrol 40 mg with several cc's of plain xylocaine. Anesthesia was provided by ethyl chloride and a 20-gauge needle was used to inject the shoulder area. A posterior approach was used.  The injection was tolerated well.  A band aid dressing was applied.  The patient was advised to apply ice later today and tomorrow to the injection sight as needed.  Precautions discussed.  Call if any  problem.

## 2015-09-20 NOTE — Patient Instructions (Signed)
Shoulder Range of Motion Exercises Shoulder range of motion (ROM) exercises are designed to keep the shoulder moving freely. They are often recommended for people who have shoulder pain. MOVEMENT EXERCISE When you are able, do this exercise 5-6 days per week, or as told by your health care provider. Work toward doing 2 sets of 10 swings. Pendulum Exercise How To Do This Exercise Lying Down 1. Lie face-down on a bed with your abdomen close to the side of the bed. 2. Let your arm hang over the side of the bed. 3. Relax your shoulder, arm, and hand. 4. Slowly and gently swing your arm forward and back. Do not use your neck muscles to swing your arm. They should be relaxed. If you are struggling to swing your arm, have someone gently swing it for you. When you do this exercise for the first time, swing your arm at a 15 degree angle for 15 seconds, or swing your arm 10 times. As pain lessens over time, increase the angle of the swing to 30-45 degrees. 5. Repeat steps 1-4 with the other arm. How To Do This Exercise While Standing 1. Stand next to a sturdy chair or table and hold on to it with your hand.  Bend forward at the waist.  Bend your knees slightly.  Relax your other arm and let it hang limp.  Relax the shoulder blade of the arm that is hanging and let it drop.  While keeping your shoulder relaxed, use body motion to swing your arm in small circles. The first time you do this exercise, swing your arm for about 30 seconds or 10 times. When you do it next time, swing your arm for a little longer.  Stand up tall and relax.  Repeat steps 1-7, this time changing the direction of the circles. 2. Repeat steps 1-8 with the other arm. STRETCHING EXERCISES Do these exercises 3-4 times per day on 5-6 days per week or as told by your health care provider. Work toward holding the stretch for 20 seconds. Stretching Exercise 1 1. Lift your arm straight out in front of you. 2. Bend your arm 90  degrees at the elbow (right angle) so your forearm goes across your body and looks like the letter "L." 3. Use your other arm to gently pull the elbow forward and across your body. 4. Repeat steps 1-3 with the other arm. Stretching Exercise 2 You will need a towel or rope for this exercise. 1. Bend one arm behind your back with the palm facing outward. 2. Hold a towel with your other hand. 3. Reach the arm that holds the towel above your head, and bend that arm at the elbow. Your wrist should be behind your neck. 4. Use your free hand to grab the free end of the towel. 5. With the higher hand, gently pull the towel up behind you. 6. With the lower hand, pull the towel down behind you. 7. Repeat steps 1-6 with the other arm. STRENGTHENING EXERCISES Do each of these exercises at four different times of day (sessions) every day or as told by your health care provider. To begin with, repeat each exercise 5 times (repetitions). Work toward doing 3 sets of 12 repetitions or as told by your health care provider. Strengthening Exercise 1 You will need a light weight for this activity. As you grow stronger, you may use a heavier weight. 1. Standing with a weight in your hand, lift your arm straight out to the side   until it is at the same height as your shoulder. 2. Bend your arm at 90 degrees so that your fingers are pointing to the ceiling. 3. Slowly raise your hand until your arm is straight up in the air. 4. Repeat steps 1-3 with the other arm. Strengthening Exercise 2 You will need a light weight for this activity. As you grow stronger, you may use a heavier weight. 1. Standing with a weight in your hand, gradually move your straight arm in an arc, starting at your side, then out in front of you, then straight up over your head. 2. Gradually move your other arm in an arc, starting at your side, then out in front of you, then straight up over your head. 3. Repeat steps 1-2 with the other  arm. Strengthening Exercise 3 You will need an elastic band for this activity. As you grow stronger, gradually increase the size of the bands or increase the number of bands that you use at one time. 1. While standing, hold an elastic band in one hand and raise that arm up in the air. 2. With your other hand, pull down the band until that hand is by your side. 3. Repeat steps 1-2 with the other arm.   This information is not intended to replace advice given to you by your health care provider. Make sure you discuss any questions you have with your health care provider.   Document Released: 02/15/2003 Document Revised: 10/03/2014 Document Reviewed: 05/15/2014 Elsevier Interactive Patient Education 2016 [employer]  

## 2015-10-04 ENCOUNTER — Ambulatory Visit (INDEPENDENT_AMBULATORY_CARE_PROVIDER_SITE_OTHER): Payer: Medicare Other | Admitting: Orthopaedic Surgery

## 2015-10-04 ENCOUNTER — Encounter: Payer: Self-pay | Admitting: Orthopaedic Surgery

## 2015-10-04 VITALS — BP 151/70 | HR 66 | Temp 97.5°F | Ht 73.0 in | Wt 187.0 lb

## 2015-10-04 DIAGNOSIS — E119 Type 2 diabetes mellitus without complications: Secondary | ICD-10-CM

## 2015-10-04 DIAGNOSIS — Z794 Long term (current) use of insulin: Secondary | ICD-10-CM

## 2015-10-04 DIAGNOSIS — M25512 Pain in left shoulder: Secondary | ICD-10-CM | POA: Diagnosis not present

## 2015-10-04 DIAGNOSIS — I10 Essential (primary) hypertension: Secondary | ICD-10-CM | POA: Diagnosis not present

## 2015-10-04 NOTE — Progress Notes (Signed)
[name] Jesse Cordova, male DOB:[birth year], 75 y.o. [id number]  Chief Complaint  Patient presents with  . Follow-up    left shoulder    HPI  Jesse Cordova is a 75 y.o. male who has left shoulder pain. I injected the left shoulder two weeks ago. He has no significant relief with that or the medicines or the exercises. He may be a little worse.  He has no paresthesias.  I will get a MRI of the shoulder.  I suspect rotator cuff tear.    HPI  Body mass index is 24.68 kg/(m^2).  Review of Systems  HENT: Negative for congestion.   Respiratory: Positive for cough. Negative for shortness of breath.   Cardiovascular: Negative for chest pain and leg swelling.  Endocrine: Negative for cold intolerance.  Musculoskeletal: Positive for back pain and arthralgias.  Allergic/Immunologic: Negative for environmental allergies.  Neurological: Negative for numbness.    Past Medical History  Diagnosis Date  . Diabetes mellitus   . Hyperlipemia   . Adrenal nodule ([place]) 11/10/2011  . Complication of anesthesia   . PONV (postoperative nausea and vomiting)   . Chronic low back pain   . Hypertension   . GERD (gastroesophageal reflux disease)   . Cancer ([place])     skin cancer  . Arthritis   . Chronic cough   . Pneumonia     Past Surgical History  Procedure Laterality Date  . Vasectomy    . Knee arthroscopy      2 right knee surgeries and 1 left knee surgery  . Biopsy  01/16/2012    Procedure: BIOPSY;  Surgeon: Rogene Houston, MD;  [facility]: [facility];  Service: Endoscopy;  Laterality: N/A;  . Tonsillectomy    . Cataract extraction Right 7/06  . Esophagogastroduodenoscopy    . Colonoscopy  01/29/12    abnormal repeat in one year  . Cataract extraction w/phaco Left 01/05/2014    Procedure: CATARACT EXTRACTION PHACO AND INTRAOCULAR LENS PLACEMENT (IOC);  Surgeon: Tonny Branch, MD;  Location: [facility];  Service: Ophthalmology;  Laterality: Left;  CDE:15.98    Family History  Problem  Relation Age of Onset  . Diabetes Sister   . Heart disease Sister   . Depression Maternal Grandfather   . Heart disease Other   . Diabetes Other     Social History Social History  Substance Use Topics  . Smoking status: Never Smoker   . Smokeless tobacco: None     Comment: From spouse. Wife does not smoke around him now  . Alcohol Use: No    Allergies  Allergen Reactions  . Cortisone     swelling  . Hydromet [Hydrocodone-Homatropine] Nausea And Vomiting  . Tetanus Toxoids     Swelling, not specified    Current Outpatient Prescriptions  Medication Sig Dispense Refill  . ACCU-CHEK AVIVA PLUS test strip USE TO TEST BLOOD SUGAR TWICE DAILY 100 each 5  . aspirin EC 81 MG tablet Take 81 mg by mouth daily.    . B-D ULTRAFINE III SHORT PEN 31G X 8 MM MISC USE AS DIRECTED 100 each 0  . benzonatate (TESSALON) 200 MG capsule TAKE ONE (1) CAPSULE THREE (3) TIMES EACH DAY AS NEEDED 30 capsule 6  . diclofenac (VOLTAREN) 75 MG EC tablet Take 1 tablet (75 mg total) by mouth 2 (two) times daily. 30 tablet 0  . diphenoxylate-atropine (LOMOTIL) 2.5-0.025 MG per tablet TAKE 1 TABLET BY MOUTH EVERY DAY AS NEEDED FOR DIARRHEA 30 tablet  2  . diphenoxylate-atropine (LOMOTIL) 2.5-0.025 MG per tablet Take 1 tablet by mouth 4 (four) times daily as needed for diarrhea or loose stools. 30 tablet 0  . diphenoxylate-atropine (LOMOTIL) 2.5-0.025 MG tablet TAKE 1 TABLET BY MOUTH FOUR TIMES DAILY AS NEEDED FOR DIARRHEA OR LOOSE STOOLS 30 tablet 3  . glipiZIDE (GLUCOTROL) 5 MG tablet TAKE 1 TABLET BY MOUTH TWICE DAILY BEFORE A MEAL 180 tablet 0  . Insulin Glargine (LANTUS SOLOSTAR) 100 UNIT/ML Solostar Pen INJECT UP TO 30 UNITS SUBCUTANEOUS EVERY NIGHT AT BEDTIME AS DIRECTED BY DOCTOR 6 pen 5  . INVOKANA 100 MG TABS tablet TAKE 1 TABLET BY MOUTH EVERY MORNING 90 tablet 1  . lansoprazole (PREVACID) 15 MG capsule Take 15 mg by mouth daily at 12 noon.    Marland Kitchen lisinopril (PRINIVIL,ZESTRIL) 2.5 MG tablet TAKE 1 TABLET  BY MOUTH EVERY DAY 90 tablet 1  . lovastatin (MEVACOR) 20 MG tablet TAKE 1 TABLET BY MOUTH EVERY NIGHT AT BEDTIME 90 tablet 0  . metoprolol succinate (TOPROL-XL) 25 MG 24 hr tablet TAKE 1 TABLET BY MOUTH EVERY DAY 30 tablet 5  . omeprazole (PRILOSEC) 20 MG capsule Take 20 mg by mouth daily.    . traMADol (ULTRAM) 50 MG tablet Take 1 tablet (50 mg total) by mouth every 8 (eight) hours as needed. 30 tablet 1   No current facility-administered medications for this visit.     Physical Exam  Blood pressure 151/70, pulse 66, temperature 97.5 F (36.4 C), height 6\' 1"  (1.854 m), weight 187 lb (84.823 kg).  Constitutional: overall normal hygiene, normal nutrition, well developed, normal grooming, normal body habitus. Assistive device:none  Musculoskeletal: gait and station Limp none, muscle tone and strength are normal, no tremors or atrophy is present.  .  Neurological: coordination overall normal.  Deep tendon reflex/nerve stretch intact.  Sensation normal.  Cranial nerves II-XII intact.   Skin:   normal overall no scars, lesions, ulcers or rashes. No psoriasis.  Psychiatric: Alert and oriented x 3.  Recent memory intact, remote memory unclear.  Normal mood and affect. Well groomed.  Good eye contact.  Cardiovascular: overall no swelling, no varicosities, no edema bilaterally, normal temperatures of the legs and arms, no clubbing, cyanosis and good capillary refill.  Lymphatic: palpation is normal.  Examination of left Upper Extremity is done.  Inspection:   Overall:  Elbow non-tender without crepitus or defects, forearm non-tender without crepitus or defects, wrist non-tender without crepitus or defects, hand non-tender.    Shoulder: with glenohumeral joint tenderness, without effusion.   Upper arm: without swelling and tenderness   Range of motion:   Overall:  Full range of motion of the elbow, full range of motion of wrist and full range of motion in fingers.   Shoulder:  left  160  degrees forward flexion; 145 degrees abduction; 30 degrees internal rotation, 30 degrees external rotation, 15 degrees extension, 40 degrees adduction.   Stability:   Overall:  Shoulder, elbow and wrist stable   Strength and Tone:   Overall full shoulder muscles strength, full upper arm strength and normal upper arm bulk and tone. He has positive pain with resisted abduction of the left shoulder.  The patient has been educated about the nature of the problem(s) and counseled on treatment options.  The patient appeared to understand what I have discussed and is in agreement with it.  Encounter Diagnoses  Name Primary?  . Left shoulder pain Yes  . Essential hypertension, benign   .  Type 2 diabetes mellitus without complication, with long-term current use of insulin ([place])     PLAN Call if any problems.  Precautions discussed.  Continue current medications.   Return to clinic after MRI of the shoulder on the left.

## 2015-10-11 ENCOUNTER — Ambulatory Visit (HOSPITAL_COMMUNITY)
Admission: RE | Admit: 2015-10-11 | Discharge: 2015-10-11 | Disposition: A | Discharge status: Home / Self Care | Payer: Medicare Other | Source: Ambulatory Visit | Attending: Orthopaedic Surgery | Admitting: Orthopaedic Surgery

## 2015-10-11 ENCOUNTER — Ambulatory Visit (HOSPITAL_COMMUNITY): Payer: Medicare Other

## 2015-10-11 DIAGNOSIS — M7552 Bursitis of left shoulder: Secondary | ICD-10-CM | POA: Insufficient documentation

## 2015-10-11 DIAGNOSIS — M25512 Pain in left shoulder: Secondary | ICD-10-CM | POA: Insufficient documentation

## 2015-10-11 DIAGNOSIS — M19012 Primary osteoarthritis, left shoulder: Secondary | ICD-10-CM | POA: Insufficient documentation

## 2015-10-18 ENCOUNTER — Encounter: Payer: Self-pay | Admitting: Orthopaedic Surgery

## 2015-10-18 ENCOUNTER — Ambulatory Visit (INDEPENDENT_AMBULATORY_CARE_PROVIDER_SITE_OTHER): Payer: Medicare Other | Admitting: Orthopaedic Surgery

## 2015-10-18 VITALS — BP 126/74 | HR 77 | Ht 73.0 in | Wt 187.0 lb

## 2015-10-18 DIAGNOSIS — M25512 Pain in left shoulder: Secondary | ICD-10-CM

## 2015-10-18 DIAGNOSIS — E119 Type 2 diabetes mellitus without complications: Secondary | ICD-10-CM | POA: Diagnosis not present

## 2015-10-18 DIAGNOSIS — Z794 Long term (current) use of insulin: Secondary | ICD-10-CM | POA: Diagnosis not present

## 2015-10-18 DIAGNOSIS — I10 Essential (primary) hypertension: Secondary | ICD-10-CM

## 2015-10-18 NOTE — Progress Notes (Signed)
Patient [id number] Jesse Cordova, male DOB:1941/01/03, 75 y.o. [id number]  Chief Complaint  Patient presents with  . Follow-up    MRI REVIEW LEFT SHOULDER    HPI  Jesse Cordova is a 75 y.o. male who has had left shoulder pain.  He is a little better.  He had a MRI of the shoulder on 10-11-15. Findings were:  IMPRESSION: Negative for evidence of trauma.  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  Subacromial/subdeltoid bursitis. I went over the findings with him.  He will continue to do his exercises.  Continue his medicine. HPI  Body mass index is 24.68 kg/(m^2).  ROS  Review of Systems  HENT: Negative for congestion.   Respiratory: Positive for cough. Negative for shortness of breath.   Cardiovascular: Negative for chest pain and leg swelling.  Endocrine: Negative for cold intolerance.  Musculoskeletal: Positive for back pain and arthralgias.  Allergic/Immunologic: Negative for environmental allergies.  Neurological: Negative for numbness.    Past Medical History  Diagnosis Date  . Diabetes mellitus   . Hyperlipemia   . Adrenal nodule ([place]) 11/10/2011  . Complication of anesthesia   . PONV (postoperative nausea and vomiting)   . Chronic low back pain   . Hypertension   . GERD (gastroesophageal reflux disease)   . Cancer ([place])     skin cancer  . Arthritis   . Chronic cough   . Pneumonia     Past Surgical History  Procedure Laterality Date  . Vasectomy    . Knee arthroscopy      2 right knee surgeries and 1 left knee surgery  . Biopsy  01/16/2012    Procedure: BIOPSY;  Surgeon: Rogene Houston, MD;  [facility];  Service: Endoscopy;  Laterality: N/A;  . Tonsillectomy    . Cataract extraction Right 7/06  . Esophagogastroduodenoscopy    . Colonoscopy  01/29/12    abnormal repeat in one year  . Cataract extraction w/phaco Left  01/05/2014    Procedure: CATARACT EXTRACTION PHACO AND INTRAOCULAR LENS PLACEMENT (IOC);  Surgeon: Tonny Branch, MD;  Location: [facility];  Service: Ophthalmology;  Laterality: Left;  CDE:15.98    Family History  Problem Relation Age of Onset  . Diabetes Sister   . Heart disease Sister   . Depression Maternal Grandfather   . Heart disease Other   . Diabetes Other     Social History Social History  Substance Use Topics  . Smoking status: Never Smoker   . Smokeless tobacco: None     Comment: From spouse. Wife does not smoke around him now  . Alcohol Use: No    Allergies  Allergen Reactions  . Cortisone     swelling  . Hydromet [Hydrocodone-Homatropine] Nausea And Vomiting  . Tetanus Toxoids     Swelling, not specified    Current Outpatient Prescriptions  Medication Sig Dispense Refill  . ACCU-CHEK AVIVA PLUS test strip USE TO TEST BLOOD SUGAR TWICE DAILY 100 each 5  . aspirin EC 81 MG tablet Take 81 mg by mouth daily.    . B-D ULTRAFINE III SHORT PEN 31G X 8 MM MISC USE AS DIRECTED 100 each 0  . benzonatate (TESSALON) 200 MG capsule TAKE ONE (1) CAPSULE THREE (3) TIMES EACH DAY AS NEEDED 30 capsule 6  . diclofenac (VOLTAREN) 75 MG EC tablet Take 1 tablet (75 mg total) by mouth 2 (two) times daily. 30 tablet  0  . diphenoxylate-atropine (LOMOTIL) 2.5-0.025 MG per tablet TAKE 1 TABLET BY MOUTH EVERY DAY AS NEEDED FOR DIARRHEA 30 tablet 2  . diphenoxylate-atropine (LOMOTIL) 2.5-0.025 MG per tablet Take 1 tablet by mouth 4 (four) times daily as needed for diarrhea or loose stools. 30 tablet 0  . diphenoxylate-atropine (LOMOTIL) 2.5-0.025 MG tablet TAKE 1 TABLET BY MOUTH FOUR TIMES DAILY AS NEEDED FOR DIARRHEA OR LOOSE STOOLS 30 tablet 3  . glipiZIDE (GLUCOTROL) 5 MG tablet TAKE 1 TABLET BY MOUTH TWICE DAILY BEFORE A MEAL 180 tablet 0  . Insulin Glargine (LANTUS SOLOSTAR) 100 UNIT/ML Solostar Pen INJECT UP TO 30 UNITS SUBCUTANEOUS EVERY NIGHT AT BEDTIME AS DIRECTED BY DOCTOR 6 pen 5  .  INVOKANA 100 MG TABS tablet TAKE 1 TABLET BY MOUTH EVERY MORNING 90 tablet 1  . lansoprazole (PREVACID) 15 MG capsule Take 15 mg by mouth daily at 12 noon.    Marland Kitchen lisinopril (PRINIVIL,ZESTRIL) 2.5 MG tablet TAKE 1 TABLET BY MOUTH EVERY DAY 90 tablet 1  . lovastatin (MEVACOR) 20 MG tablet TAKE 1 TABLET BY MOUTH EVERY NIGHT AT BEDTIME 90 tablet 0  . metoprolol succinate (TOPROL-XL) 25 MG 24 hr tablet TAKE 1 TABLET BY MOUTH EVERY DAY 30 tablet 5  . omeprazole (PRILOSEC) 20 MG capsule Take 20 mg by mouth daily.    . traMADol (ULTRAM) 50 MG tablet Take 1 tablet (50 mg total) by mouth every 8 (eight) hours as needed. 30 tablet 1   No current facility-administered medications for this visit.     Physical Exam  Blood pressure 126/74, pulse 77, height 6\' 1"  (1.854 m), weight 187 lb (84.823 kg).  Constitutional: overall normal hygiene, normal nutrition, well developed, normal grooming, normal body habitus. Assistive device:none  Musculoskeletal: gait and station Limp none, muscle tone and strength are normal, no tremors or atrophy is present.  .  Neurological: coordination overall normal.  Deep tendon reflex/nerve stretch intact.  Sensation normal.  Cranial nerves II-XII intact.   Skin:   normal overall no scars, lesions, ulcers or rashes. No psoriasis.  Psychiatric: Alert and oriented x 3.  Recent memory intact, remote memory unclear.  Normal mood and affect. Well groomed.  Good eye contact.  Cardiovascular: overall no swelling, no varicosities, no edema bilaterally, normal temperatures of the legs and arms, no clubbing, cyanosis and good capillary refill.  Lymphatic: palpation is normal.  Examination of left Upper Extremity is done.  Inspection:   Overall:  Elbow non-tender without crepitus or defects, forearm non-tender without crepitus or defects, wrist non-tender without crepitus or defects, hand non-tender.    Shoulder: with glenohumeral joint tenderness, without effusion.   Upper arm:  without swelling and tenderness   Range of motion:   Overall:  Full range of motion of the elbow, full range of motion of wrist and full range of motion in fingers.   Shoulder:  left  170 degrees forward flexion; 160 degrees abduction; 35 degrees internal rotation, 35 degrees external rotation, 20 degrees extension, 40 degrees adduction.   Stability:   Overall:  Shoulder, elbow and wrist stable   Strength and Tone:   Overall full shoulder muscles strength, full upper arm strength and normal upper arm bulk and tone.  Right shoulder negative.  The patient has been educated about the nature of the problem(s) and counseled on treatment options.  The patient appeared to understand what I have discussed and is in agreement with it.  Encounter Diagnoses  Name Primary?  . Left shoulder  pain Yes  . Essential hypertension, benign   . Type 2 diabetes mellitus without complication, with long-term current use of insulin ([place])     PLAN Call if any problems.  Precautions discussed.  Continue current medications.   Return to clinic 6 weeks

## 2015-10-21 ENCOUNTER — Other Ambulatory Visit: Payer: Self-pay | Admitting: Family Medicine

## 2015-10-27 ENCOUNTER — Other Ambulatory Visit: Payer: Self-pay | Admitting: Family Medicine

## 2015-11-27 ENCOUNTER — Other Ambulatory Visit: Payer: Self-pay | Admitting: Family Medicine

## 2015-11-29 ENCOUNTER — Ambulatory Visit: Payer: Medicare Other | Admitting: Orthopaedic Surgery

## 2015-12-15 ENCOUNTER — Other Ambulatory Visit: Payer: Self-pay | Admitting: Family Medicine

## 2015-12-19 ENCOUNTER — Telehealth: Payer: Self-pay | Admitting: Family Medicine

## 2015-12-19 NOTE — Telephone Encounter (Signed)
Daughter is not on patient's HIPAA.

## 2015-12-19 NOTE — Telephone Encounter (Signed)
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 Daughter is wanting dr. Lars Mage opinion. Please advise.

## 2015-12-25 NOTE — Telephone Encounter (Signed)
I discussed the case with his wife as well as the daughter when they can buy for wife's office visit the patient is getting care through [facility]

## 2015-12-27 ENCOUNTER — Encounter: Payer: Self-pay | Admitting: *Deleted

## 2015-12-27 NOTE — Telephone Encounter (Signed)
This encounter was created in error - please disregard.

## 2015-12-28 ENCOUNTER — Ambulatory Visit (INDEPENDENT_AMBULATORY_CARE_PROVIDER_SITE_OTHER): Payer: Medicare Other | Admitting: Cardiovascular Disease

## 2015-12-28 ENCOUNTER — Encounter: Payer: Self-pay | Admitting: Cardiovascular Disease

## 2015-12-28 ENCOUNTER — Other Ambulatory Visit: Payer: Self-pay | Admitting: Family Medicine

## 2015-12-28 VITALS — BP 126/64 | HR 70 | Ht 73.0 in | Wt 182.0 lb

## 2015-12-28 DIAGNOSIS — Z01818 Encounter for other preprocedural examination: Secondary | ICD-10-CM | POA: Diagnosis not present

## 2015-12-28 DIAGNOSIS — R011 Cardiac murmur, unspecified: Secondary | ICD-10-CM

## 2015-12-28 DIAGNOSIS — I10 Essential (primary) hypertension: Secondary | ICD-10-CM

## 2015-12-28 DIAGNOSIS — E785 Hyperlipidemia, unspecified: Secondary | ICD-10-CM | POA: Diagnosis not present

## 2015-12-28 NOTE — Progress Notes (Signed)
Cardiology Office Note   Date:  12/28/2015   ID:  JEP STJULIAN, DOB 10-04-1940, MRN [id number]  PCP:  Sallee Lange, MD  Cardiologist:   Skeet Latch, MD   Chief Complaint  Patient presents with  . New Patient (Initial Visit)    heart murmur    History of Present Illness: Jesse Cordova is a 75 y.o. male with diabetes, hypertension, hyperlipidemia, GERD and merkel cell carcinoma, who presents for presurgical risk assessment.  Jesse Cordova has been feeling well. 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 He was referred for resection and was noted to have a murmur. His surgery is scheduled on August 11 but he was referred for cardiology evaluation prior to surgery. Jesse Cordova has been feeling well and denies any chest pain or shortness of breath. He also has not noted any lower extremity edema, orthopnea, PND, lightheadedness, palpitations, or dizziness. He walks daily for 30 minutes to one hour. He is also able to climb more than one flight of stairs without any chest pain or shortness of breath. He has never been told that he had a murmur before.  His only complaint is joint pain due to arthritis.   Past Medical History:  Diagnosis Date  . Adrenal nodule ([place]) 11/10/2011  . Arthritis   . Cancer ([place])    skin cancer  . Chronic cough   . Chronic low back pain   . Complication of anesthesia   . Diabetes mellitus   . GERD (gastroesophageal reflux disease)   . Hyperlipemia   . Hypertension   . Pneumonia   . PONV (postoperative nausea and vomiting)     Past Surgical History:  Procedure Laterality Date  . BIOPSY  01/16/2012   Procedure: BIOPSY;  Surgeon: Rogene Houston, MD;  [facility];  Service: Endoscopy;  Laterality: N/A;  . CATARACT EXTRACTION Right 7/06  . CATARACT EXTRACTION W/PHACO Left 01/05/2014   Procedure: CATARACT EXTRACTION PHACO AND INTRAOCULAR LENS PLACEMENT (IOC);  Surgeon: Tonny Branch, MD;  Location: [facility];  Service:  Ophthalmology;  Laterality: Left;  CDE:15.98  . COLONOSCOPY  01/29/12   abnormal repeat in one year  . ESOPHAGOGASTRODUODENOSCOPY    . KNEE ARTHROSCOPY     2 right knee surgeries and 1 left knee surgery  . TONSILLECTOMY    . VASECTOMY       Current Outpatient Prescriptions  Medication Sig Dispense Refill  . aspirin EC 81 MG tablet Take 81 mg by mouth daily.    . benzonatate (TESSALON) 200 MG capsule TAKE ONE (1) CAPSULE THREE (3) TIMES EACH DAY AS NEEDED 30 capsule 6  . diclofenac (VOLTAREN) 75 MG EC tablet Take 1 tablet (75 mg total) by mouth 2 (two) times daily. 30 tablet 0  . diphenoxylate-atropine (LOMOTIL) 2.5-0.025 MG tablet TAKE 1 TABLET BY MOUTH FOUR TIMES DAILY AS NEEDED FOR DIARRHEA OR LOOSE STOOLS 30 tablet 3  . glipiZIDE (GLUCOTROL) 5 MG tablet TAKE 1 TABLET BY MOUTH TWICE DAILY BEFORE A MEAL 180 tablet 0  . Insulin Glargine (LANTUS SOLOSTAR) 100 UNIT/ML Solostar Pen INJECT UP TO 30 UNITS SUBCUTANEOUS EVERY NIGHT AT BEDTIME AS DIRECTED BY DOCTOR (Patient taking differently: Inject 22 Units into the skin. INJECT UP TO 30 UNITS SUBCUTANEOUS EVERY NIGHT AT BEDTIME AS DIRECTED BY DOCTOR) 6 pen 5  . INVOKANA 100 MG TABS tablet TAKE 1 TABLET BY MOUTH EVERY MORNING 90 tablet 1  . lansoprazole (PREVACID) 15 MG capsule Take  15 mg by mouth daily at 12 noon.    Marland Kitchen lisinopril (PRINIVIL,ZESTRIL) 2.5 MG tablet TAKE 1 TABLET BY MOUTH EVERY DAY 30 tablet 0  . lovastatin (MEVACOR) 20 MG tablet TAKE 1 TABLET BY MOUTH EVERY NIGHT AT BEDTIME 90 tablet 0  . metoprolol succinate (TOPROL-XL) 25 MG 24 hr tablet TAKE 1 TABLET BY MOUTH EVERY DAY 30 tablet 0  . omeprazole (PRILOSEC) 20 MG capsule Take 20 mg by mouth daily.    . traMADol (ULTRAM) 50 MG tablet Take 1 tablet (50 mg total) by mouth every 8 (eight) hours as needed. 30 tablet 1   No current facility-administered medications for this visit.     Allergies:   Cortisone; Hydromet [hydrocodone-homatropine]; and Tetanus toxoids    Social  History:  The patient  reports that he has never smoked. He does not have any smokeless tobacco history on file. He reports that he does not drink alcohol or use drugs.   Family History:  The patient's family history includes Atrial fibrillation in his sister; Depression in his maternal grandfather; Diabetes in his other and sister; Heart disease in his other and sister.    ROS:  Please see the history of present illness.   Otherwise, review of systems are positive for none.   All other systems are reviewed and negative.    PHYSICAL EXAM: VS:  BP 126/64   Pulse 70   Ht 6\' 1"  (1.854 m)   Wt 182 lb (82.6 kg)   BMI 24.01 kg/m  , BMI Body mass index is 24.01 kg/m. GENERAL:  Well appearing HEENT:  Pupils equal round and reactive, fundi not visualized, oral mucosa unremarkable NECK:  No jugular venous distention, waveform within normal limits, carotid upstroke brisk and symmetric, no bruits, no thyromegaly LYMPHATICS:  No cervical adenopathy LUNGS:  Clear to auscultation bilaterally HEART:  RRR.  PMI not displaced or sustained,S1 and S2 within normal limits, no S3, no S4, no clicks, no rubs, III/VI mid-peaking systolic murmur at the LUSB with radiation to bilateral carotids.  ABD:  Flat, positive bowel sounds normal in frequency in pitch, no bruits, no rebound, no guarding, no midline pulsatile mass, no hepatomegaly, no splenomegaly EXT:  2 plus pulses throughout, no edema, no cyanosis no clubbing SKIN:  No rashes no nodules NEURO:  Cranial nerves II through XII grossly intact, motor grossly intact throughout PSYCH:  Cognitively intact, oriented to person place and time    EKG:  EKG is ordered today. The ekg ordered today demonstrates  Sinus rhythm. Rate 70 bpm. Left ventricuar hypertrophy with repolarization abnormalities.  LAFB.   Recent Labs: No results found for requested labs within last 8760 hours.    Lipid Panel    Component Value Date/Time   CHOL 120 06/05/2014 0717   TRIG  66 06/05/2014 0717   HDL 36 (L) 06/05/2014 0717   CHOLHDL 3.3 06/05/2014 0717   VLDL 13 06/05/2014 0717   LDLCALC 71 06/05/2014 0717      Wt Readings from Last 3 Encounters:  12/28/15 182 lb (82.6 kg)  10/18/15 187 lb (84.8 kg)  10/04/15 187 lb (84.8 kg)      ASSESSMENT AND PLAN:  # Pre-surgical risk assessment: # Systolic murmur:   Mr. Mccullough is in excellent physical condition.  Upon evaluation today, he can achieve 4 METs or greater without anginal symptoms.  According to [facility] and AHA guidelines, he requires no further ischemic workup prior to his noncardiac surgery.  However,  He does have  a systolic murmur on exam that seems most likely to be due to moderate aortic stenosis.  Therefore, we will obtain an echocardiogram to better assess.  # Hypertension: BP is well-controlled.   Continue lisinopril and metoprolol.  # Hyperlipidemia: LDL 71 on lovastatin.  Current medicines are reviewed at length with the patient today.  The patient does not have concerns regarding medicines.  The following changes have been made:  none  Labs/ tests ordered today include:   Orders Placed This Encounter  Procedures  . EKG 12-Lead  . ECHOCARDIOGRAM COMPLETE     Disposition:   FU with Glendora Clouatre C. Oval Linsey, MD, [facility] in 2 months. Dictation #1 [id number]  [id number]     This note was written with the assistance of speech recognition software.  Please excuse any transcriptional errors.  Signed, Alcides Nutting C. Oval Linsey, MD, [facility]  12/28/2015 2:53 PM    [place]

## 2015-12-28 NOTE — Patient Instructions (Signed)
Medication Instructions:  Your physician recommends that you continue on your current medications as directed. Please refer to the Current Medication list given to you today.  Labwork: none  Testing/Procedures: Your physician has requested that you have an echocardiogram. Echocardiography is a painless test that uses sound waves to create images of your heart. It provides your doctor with information about the size and shape of your heart and how well your heart's chambers and valves are working. This procedure takes approximately one hour. There are no restrictions for this procedure.  Follow-Up: Your physician recommends that you schedule a follow-up appointment in: 2 month ov  If you need a refill on your cardiac medications before your next appointment, please call your pharmacy.

## 2016-01-04 ENCOUNTER — Ambulatory Visit (HOSPITAL_COMMUNITY): Payer: Medicare Other | Attending: Cardiology

## 2016-01-04 ENCOUNTER — Other Ambulatory Visit: Payer: Self-pay

## 2016-01-04 DIAGNOSIS — I119 Hypertensive heart disease without heart failure: Secondary | ICD-10-CM | POA: Diagnosis not present

## 2016-01-04 DIAGNOSIS — E785 Hyperlipidemia, unspecified: Secondary | ICD-10-CM | POA: Insufficient documentation

## 2016-01-04 DIAGNOSIS — I352 Nonrheumatic aortic (valve) stenosis with insufficiency: Secondary | ICD-10-CM | POA: Insufficient documentation

## 2016-01-04 DIAGNOSIS — R011 Cardiac murmur, unspecified: Secondary | ICD-10-CM

## 2016-01-04 DIAGNOSIS — I340 Nonrheumatic mitral (valve) insufficiency: Secondary | ICD-10-CM | POA: Insufficient documentation

## 2016-01-16 ENCOUNTER — Encounter: Payer: Self-pay | Admitting: Family Medicine

## 2016-02-11 ENCOUNTER — Other Ambulatory Visit: Payer: Self-pay | Admitting: Family Medicine

## 2016-02-18 ENCOUNTER — Ambulatory Visit (INDEPENDENT_AMBULATORY_CARE_PROVIDER_SITE_OTHER): Payer: Medicare Other | Admitting: Family Medicine

## 2016-02-18 ENCOUNTER — Other Ambulatory Visit: Payer: Self-pay

## 2016-02-18 ENCOUNTER — Encounter: Payer: Self-pay | Admitting: Family Medicine

## 2016-02-18 VITALS — BP 120/80 | Ht 73.0 in | Wt 186.4 lb

## 2016-02-18 DIAGNOSIS — C4A30 Merkel cell carcinoma of unspecified part of face: Secondary | ICD-10-CM

## 2016-02-18 DIAGNOSIS — I350 Nonrheumatic aortic (valve) stenosis: Secondary | ICD-10-CM

## 2016-02-18 DIAGNOSIS — I10 Essential (primary) hypertension: Secondary | ICD-10-CM | POA: Diagnosis not present

## 2016-02-18 DIAGNOSIS — Z79899 Other long term (current) drug therapy: Secondary | ICD-10-CM

## 2016-02-18 DIAGNOSIS — E119 Type 2 diabetes mellitus without complications: Secondary | ICD-10-CM | POA: Diagnosis not present

## 2016-02-18 DIAGNOSIS — E785 Hyperlipidemia, unspecified: Secondary | ICD-10-CM | POA: Diagnosis not present

## 2016-02-18 HISTORY — DX: Merkel cell carcinoma of unspecified part of face: C4A.30

## 2016-02-18 LAB — POCT GLYCOSYLATED HEMOGLOBIN (HGB A1C): Hemoglobin A1C: 6.1

## 2016-02-18 MED ORDER — GLIPIZIDE 5 MG PO TABS: 2.5000 mg | ORAL_TABLET | Freq: Two times a day (BID) | ORAL | 3 refills | Status: DC

## 2016-02-18 MED ORDER — OMEPRAZOLE 20 MG PO CPDR: 20.0000 mg | DELAYED_RELEASE_CAPSULE | Freq: Every day | ORAL | 3 refills | Status: DC

## 2016-02-18 MED ORDER — TRIAMCINOLONE ACETONIDE 0.1 % EX CREA: 1.0000 "application " | TOPICAL_CREAM | Freq: Two times a day (BID) | CUTANEOUS | 4 refills | Status: DC | PRN

## 2016-02-18 MED ORDER — EMPAGLIFLOZIN 10 MG PO TABS: 10.0000 mg | ORAL_TABLET | Freq: Every day | ORAL | 4 refills | Status: DC

## 2016-02-18 MED ORDER — LOVASTATIN 20 MG PO TABS: 20.0000 mg | ORAL_TABLET | Freq: Every day | ORAL | 3 refills | Status: DC

## 2016-02-18 NOTE — Patient Instructions (Signed)
Stop Invokana Reduce night insulin to 20 units  Reduce glipizide to 1/2 in the am and 1/2 at supper  Do your labs within 3 weeks  Recheck in 4 months

## 2016-02-18 NOTE — Progress Notes (Signed)
Subjective:    Patient ID: Jesse Cordova, male    DOB: 1940/07/25, 75 y.o.   MRN: [id number]  Diabetes  He presents for his follow-up diabetic visit. He has type 2 diabetes mellitus. There are no hypoglycemic associated symptoms. Pertinent negatives for hypoglycemia include no confusion. There are no diabetic associated symptoms. Pertinent negatives for diabetes include no chest pain, no fatigue, no polydipsia, no polyphagia and no weakness. There are no hypoglycemic complications. There are no diabetic complications. There are no known risk factors for coronary artery disease. Current diabetic treatment includes insulin injections. He is compliant with treatment all of the time.   Patient has not had a recent diabetic eye exam.   Patient has insect bites on his legs. He would like some cream prescribed for this.   Results for orders placed or performed in visit on 02/18/16  POCT glycosylated hemoglobin (Hb A1C)  Result Value Ref Range   Hemoglobin A1C 6.1     we had a discussion regarding his cholesterol he takes his medicine watch his diet The patient states he will be getting a check up in the coming months Patient had multiple insect bites on his legs need endocrine help with itching Patient is also under the care of cardiology for aortic stenosis murmur Patient also being followed by [facility] for Merkel cancer   Review of Systems  Constitutional: Negative for activity change, appetite change and fatigue.  HENT: Negative for congestion.   Respiratory: Negative for cough.   Cardiovascular: Negative for chest pain.  Gastrointestinal: Negative for abdominal pain.  Endocrine: Negative for polydipsia and polyphagia.  Neurological: Negative for weakness.  Psychiatric/Behavioral: Negative for confusion.       Objective:   Physical Exam  Constitutional: He appears well-nourished. No distress.  Cardiovascular: Normal rate, regular rhythm and normal heart sounds.   No  murmur heard. Pulmonary/Chest: Effort normal and breath sounds normal. No respiratory distress.  Musculoskeletal: He exhibits no edema.  Lymphadenopathy:    He has no cervical adenopathy.  Neurological: He is alert.  Psychiatric: His behavior is normal.  Vitals reviewed.    diabetic foot exam normal      Assessment & Plan:  1. Type 2 diabetes mellitus without complication, unspecified long term insulin use status (HCC)  a1c actually on the low end. Due to multiple factors reduce insulin to 20 units nightly. Change glipizide to a half a tablet twice daily, stop invokana, start jardiance 10 mg daily, recheck in 4 months check a1c at that time - POCT glycosylated hemoglobin (Hb A1C) - Microalbumin / creatinine urine ratio  2. Hyperlipidemia  previous lipid reviewed. Patient up-to-date lab work within the next couple weeks continue medication watch diet - Lipid panel  3. Essential hypertension, benign  blood pressure good control HTN- Patient was seen today as part of a visit regarding hypertension. The importance of healthy diet and regular physical activity was discussed. The importance of compliance with medications discussed. Ideal goal is to keep blood pressure low elevated levels certainly below [id number] when possible. The patient was counseled that keeping blood pressure under control lessen his risk of heart attack, stroke, kidney failure, and early death. The importance of regular follow-ups was discussed with the patient. Low-salt diet such as DASH recommended. Regular physical activity was recommended as well. Patient was advised to keep regular follow-ups.    4. Aortic stenosis  being followed by cardiology. Patient asymptomatic.  5. 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

## 2016-02-20 ENCOUNTER — Other Ambulatory Visit: Payer: Self-pay | Admitting: Family Medicine

## 2016-02-21 ENCOUNTER — Encounter: Payer: Self-pay | Admitting: Family Medicine

## 2016-02-21 LAB — HEPATIC FUNCTION PANEL
ALBUMIN: 4.1 g/dL (ref 3.6–5.1)
ALK PHOS: 50 U/L (ref 40–115)
ALT: 11 U/L (ref 9–46)
AST: 15 U/L (ref 10–35)
BILIRUBIN DIRECT: 0.2 mg/dL (ref <=0.2)
BILIRUBIN TOTAL: 0.6 mg/dL (ref 0.2–1.2)
Indirect Bilirubin: 0.4 mg/dL (ref 0.2–1.2)
Total Protein: 6.7 g/dL (ref 6.1–8.1)

## 2016-02-21 LAB — BASIC METABOLIC PANEL
BUN: 17 mg/dL (ref 7–25)
CHLORIDE: 108 mmol/L (ref 98–110)
CO2: 21 mmol/L (ref 20–31)
CREATININE: 1.25 mg/dL — AB (ref 0.70–1.18)
Calcium: 9.2 mg/dL (ref 8.6–10.3)
GLUCOSE: 93 mg/dL (ref 65–99)
Potassium: 4.1 mmol/L (ref 3.5–5.3)
Sodium: 141 mmol/L (ref 135–146)

## 2016-02-21 LAB — LIPID PANEL
Cholesterol: 113 mg/dL — ABNORMAL LOW (ref 125–200)
HDL: 45 mg/dL (ref >=40)
LDL CALC: 57 mg/dL (ref <130)
Total CHOL/HDL Ratio: 2.5 Ratio (ref <=5.0)
Triglycerides: 57 mg/dL (ref <150)
VLDL: 11 mg/dL (ref <30)

## 2016-02-21 LAB — MICROALBUMIN / CREATININE URINE RATIO
Creatinine, Urine: 135 mg/dL (ref 20–370)
MICROALB/CREAT RATIO: 4 ug/mg{creat} (ref <30)
Microalb, Ur: 0.5 mg/dL

## 2016-02-22 NOTE — Telephone Encounter (Signed)
Sure- he has an appt Tuesday at 1:30 for a flu shot thanks-Dr.Anabia Weatherwax

## 2016-02-26 ENCOUNTER — Ambulatory Visit (INDEPENDENT_AMBULATORY_CARE_PROVIDER_SITE_OTHER): Payer: Medicare Other | Admitting: *Deleted

## 2016-02-26 DIAGNOSIS — Z23 Encounter for immunization: Secondary | ICD-10-CM | POA: Diagnosis not present

## 2016-02-28 ENCOUNTER — Ambulatory Visit: Payer: Medicare Other | Admitting: Cardiovascular Disease

## 2016-03-07 ENCOUNTER — Other Ambulatory Visit: Payer: Self-pay | Admitting: Family Medicine

## 2016-03-28 ENCOUNTER — Other Ambulatory Visit: Payer: Self-pay | Admitting: Family Medicine

## 2016-04-14 ENCOUNTER — Ambulatory Visit: Payer: Medicare Other | Admitting: Cardiovascular Disease

## 2016-04-18 ENCOUNTER — Encounter: Payer: Self-pay | Admitting: Cardiovascular Disease

## 2016-04-18 ENCOUNTER — Ambulatory Visit (INDEPENDENT_AMBULATORY_CARE_PROVIDER_SITE_OTHER): Payer: Medicare Other | Admitting: Cardiovascular Disease

## 2016-04-18 VITALS — BP 139/73 | HR 72 | Ht 73.0 in | Wt 187.6 lb

## 2016-04-18 DIAGNOSIS — I351 Nonrheumatic aortic (valve) insufficiency: Secondary | ICD-10-CM | POA: Insufficient documentation

## 2016-04-18 DIAGNOSIS — I350 Nonrheumatic aortic (valve) stenosis: Secondary | ICD-10-CM

## 2016-04-18 DIAGNOSIS — I10 Essential (primary) hypertension: Secondary | ICD-10-CM | POA: Diagnosis not present

## 2016-04-18 DIAGNOSIS — E7800 Pure hypercholesterolemia, unspecified: Secondary | ICD-10-CM

## 2016-04-18 NOTE — Progress Notes (Signed)
Cardiology Office Note   Date:  04/18/2016   ID:  DARIAS DUQUE, DOB [birth year], MRN [id number]  PCP:  Sallee Lange, MD  Cardiologist:   Skeet Latch, MD   Chief Complaint  Patient presents with  . Follow-up    History of Present Illness: Jesse Cordova is a 75 y.o. male with moderate aortic stenosis, moderate aortic regurgitation, diabetes, hypertension, hyperlipidemia, GERD and merkel cell carcinoma, who presents for follow up.  He was first seen 12/2015 for presurgical risk assessment prior to surgery for Merkel cell carcinoma of the face. He was referred for resection and was noted to have a murmur.  He was feeling well at the time. He had an echocardiogram 01/04/16 that revealed LVEF 65-70% with grade 2 diastolic dysfunction. He also had moderate aortic stenosis and moderate regurgitation. The mean gradient was 23 mmHg.     Mr. Bolinger has been doing well.  He denies chest pain, shortness of breath, lower extremity edema, orthopnea or PND.  He had surgery at [facility] 01/11/16 for his Merkel cell carcinoma.  He finished radiation last week.      Past Medical History:  Diagnosis Date  . 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 Arthritis   . Cancer ([place])    skin cancer  . Chronic cough   . Chronic low back pain   . Complication of anesthesia   . Diabetes mellitus   . GERD (gastroesophageal reflux disease)   . Hyperlipemia   . Hypertension   . Moderate aortic regurgitation 04/18/2016  . Pneumonia   . PONV (postoperative nausea and vomiting)     Past Surgical History:  Procedure Laterality Date  . BIOPSY  01/16/2012   Procedure: BIOPSY;  Surgeon: Rogene Houston, MD;  Location: [facility] ENDO SUITE;  Service: Endoscopy;  Laterality: N/A;  . CATARACT EXTRACTION Right 7/06  . CATARACT EXTRACTION W/PHACO Left 01/05/2014   Procedure: CATARACT EXTRACTION PHACO AND INTRAOCULAR LENS PLACEMENT (IOC);  Surgeon: Tonny Branch, MD;  Location: [facility] ORS;  Service: Ophthalmology;  Laterality:  Left;  CDE:15.98  . COLONOSCOPY  01/29/12   abnormal repeat in one year  . ESOPHAGOGASTRODUODENOSCOPY    . KNEE ARTHROSCOPY     2 right knee surgeries and 1 left knee surgery  . TONSILLECTOMY    . VASECTOMY       Current Outpatient Prescriptions  Medication Sig Dispense Refill  . ACCU-CHEK AVIVA PLUS test strip USE TO TEST BLOOD SUGAR TWICE DAILY 100 each 5  . aspirin EC 81 MG tablet Take 81 mg by mouth daily.    . benzonatate (TESSALON) 200 MG capsule Take 1 capsule by mouth 3 (three) times daily as needed.    . diclofenac (VOLTAREN) 75 MG EC tablet Take 1 tablet (75 mg total) by mouth 2 (two) times daily. 30 tablet 0  . diphenoxylate-atropine (LOMOTIL) 2.5-0.025 MG tablet TAKE 1 TABLET BY MOUTH FOUR TIMES DAILY AS NEEDED FOR DIARRHEA OR LOOSE STOOLS 30 tablet 3  . empagliflozin (JARDIANCE) 10 MG TABS tablet Take 10 mg by mouth daily. 30 tablet 4  . glipiZIDE (GLUCOTROL) 5 MG tablet Take 0.5 tablets (2.5 mg total) by mouth 2 (two) times daily before a meal. 90 tablet 3  . LANTUS SOLOSTAR 100 UNIT/ML Solostar Pen INJECT UP TO 30 UNITS INTO SKIN EVERY NIGHT AT BEDTIME AS DIRECTED BY DOCTOR 30 mL 5  . lisinopril (PRINIVIL,ZESTRIL) 2.5 MG tablet TAKE 1 TABLET BY MOUTH EVERY DAY 30 tablet 5  .  lovastatin (MEVACOR) 20 MG tablet Take 1 tablet (20 mg total) by mouth at bedtime. 90 tablet 3  . lovastatin (MEVACOR) 20 MG tablet TAKE 1 TABLET BY MOUTH EVERY NIGHT AT BEDTIME 90 tablet 0  . metoprolol succinate (TOPROL-XL) 25 MG 24 hr tablet TAKE 1 TABLET BY MOUTH EVERY DAY 30 tablet 5  . omeprazole (PRILOSEC) 20 MG capsule Take 1 capsule (20 mg total) by mouth daily. 90 capsule 3  . traMADol (ULTRAM) 50 MG tablet Take 1 tablet (50 mg total) by mouth every 8 (eight) hours as needed. 30 tablet 1  . triamcinolone cream (KENALOG) 0.1 % Apply 1 application topically 2 (two) times daily as needed. 45 g 4   No current facility-administered medications for this visit.     Allergies:   Cortisone; Hydromet  [hydrocodone-homatropine]; and Tetanus toxoids    Social History:  The patient  reports that he has never smoked. He does not have any smokeless tobacco history on file. He reports that he does not drink alcohol or use drugs.   Family History:  The patient's family history includes Atrial fibrillation in his sister; Depression in his maternal grandfather; Diabetes in his other and sister; Heart disease in his other and sister.    ROS:  Please see the history of present illness.   Otherwise, review of systems are positive for none.   All other systems are reviewed and negative.    PHYSICAL EXAM: VS:  BP 139/73   Pulse 72   Ht 6\' 1"  (1.854 m)   Wt 85.1 kg (187 lb 9.6 oz)   BMI 24.75 kg/m  , BMI Body mass index is 24.75 kg/m. GENERAL:  Well appearing HEENT:  Pupils equal round and reactive, fundi not visualized, oral mucosa unremarkable NECK:  No jugular venous distention, waveform within normal limits, carotid upstroke brisk and symmetric, no bruits, no thyromegaly LYMPHATICS:  No cervical adenopathy LUNGS:  Clear to auscultation bilaterally HEART:  RRR.  PMI not displaced or sustained,S1 and S2 within normal limits, no S3, no S4, no clicks, no rubs, III/VI mid-peaking systolic murmur at the LUSB with radiation to bilateral carotids.  ABD:  Flat, positive bowel sounds normal in frequency in pitch, no bruits, no rebound, no guarding, no midline pulsatile mass, no hepatomegaly, no splenomegaly EXT:  2 plus pulses throughout, no edema, no cyanosis no clubbing SKIN:  No rashes no nodules NEURO:  Cranial nerves II through XII grossly intact, motor grossly intact throughout PSYCH:  Cognitively intact, oriented to person place and time    EKG:  EKG is ordered today. The ekg ordered today demonstrates  Sinus rhythm. Rate 70 bpm. Left ventricuar hypertrophy with repolarization abnormalities.  LAFB.  Echo 01/04/16: Study Conclusions  - Left ventricle: The cavity size was normal. There was  mild   concentric hypertrophy. Systolic function was vigorous. The   estimated ejection fraction was in the range of 65% to 70%. Wall   motion was normal; there were no regional wall motion   abnormalities. Features are consistent with a pseudonormal left   ventricular filling pattern, with concomitant abnormal relaxation   and increased filling pressure (grade 2 diastolic dysfunction). - Aortic valve: Valve mobility was restricted. There was moderate   stenosis. There was moderate regurgitation. Peak velocity (S):   327 cm/s. Mean gradient (S): 23 mm Hg. Valve area (Vmean): 1.42   cm^2. - Mitral valve: Moderately calcified annulus. Mildly thickened   leaflets . There was mild regurgitation. - Left atrium: The  atrium was mildly dilated.  Recent Labs: 02/20/2016: ALT 11; BUN 17; Creat 1.25; Potassium 4.1; Sodium 141    Lipid Panel    Component Value Date/Time   CHOL 113 (L) 02/20/2016 0803   TRIG 57 02/20/2016 0803   HDL 45 02/20/2016 0803   CHOLHDL 2.5 02/20/2016 0803   VLDL 11 02/20/2016 0803   LDLCALC 57 02/20/2016 0803      Wt Readings from Last 3 Encounters:  04/18/16 85.1 kg (187 lb 9.6 oz)  02/18/16 84.5 kg (186 lb 6 oz)  12/28/15 82.6 kg (182 lb)      ASSESSMENT AND PLAN:  # Moderate aortic stenosis: # Moderate aortic regurgitation: Mr. Vanginkel Is completely asymptomatic. Mean gradient across the aortic valve is 23 mmHg 01/2016. We will repeat his echo in 1 year. He was advised to call us if he develops any shortness of breath, chest pain, lightheadedness, dizziness, or lower extremity edema.  # Hypertension: BP is well-controlled.   Continue lisinopril and metoprolol.  # Hyperlipidemia: LDL 71 on lovastatin.  Current medicines are reviewed at length with the patient today.  The patient does not have concerns regarding medicines.  The following changes have been made:  none  Labs/ tests ordered today include:   No orders of the defined types were placed in  this encounter.    Disposition:   FU with Althea Backs C. Oval Linsey, MD, [facility] in 2 months.    This note was written with the assistance of speech recognition software.  Please excuse any transcriptional errors.  Signed, Gerell Fortson C. Oval Linsey, MD, [facility]  04/18/2016 10:15 AM    [place]

## 2016-04-18 NOTE — Patient Instructions (Addendum)
Medication Instructions:  Your physician recommends that you continue on your current medications as directed. Please refer to the Current Medication list given to you today.  Labwork: none  Testing/Procedures: Your physician has requested that you have an echocardiogram. Echocardiography is a painless test that uses sound waves to create images of your heart. It provides your doctor with information about the size and shape of your heart and how well your heart's chambers and valves are working. This procedure takes approximately one hour. There are no restrictions for this procedure.  [facility] [place] [address]  IN ONE YEAR- FOLLOW UP WITH DR RANDLOPH FEW DAYS LATER   Follow-Up: Your physician wants you to follow-up in: [place] [facility]  You will receive a reminder letter in the mail two months in advance. If you don't receive a letter, please call our office to schedule the follow-up appointment.  If you need a refill on your cardiac medications before your next appointment, please call your pharmacy.

## 2016-04-21 ENCOUNTER — Ambulatory Visit (HOSPITAL_COMMUNITY)
Admission: RE | Admit: 2016-04-21 | Discharge: 2016-04-21 | Disposition: A | Discharge status: Home / Self Care | Payer: Medicare Other | Source: Ambulatory Visit | Attending: Family Medicine | Admitting: Family Medicine

## 2016-04-21 ENCOUNTER — Encounter: Payer: Self-pay | Admitting: Family Medicine

## 2016-04-21 ENCOUNTER — Ambulatory Visit (INDEPENDENT_AMBULATORY_CARE_PROVIDER_SITE_OTHER): Payer: Medicare Other | Admitting: Family Medicine

## 2016-04-21 ENCOUNTER — Other Ambulatory Visit (HOSPITAL_COMMUNITY)
Admission: RE | Admit: 2016-04-21 | Discharge: 2016-04-21 | Disposition: A | Discharge status: Home / Self Care | Payer: Medicare Other | Source: Ambulatory Visit | Attending: Family Medicine | Admitting: Family Medicine

## 2016-04-21 VITALS — BP 108/62 | Temp 97.5°F | Ht 73.0 in | Wt 186.4 lb

## 2016-04-21 DIAGNOSIS — J9811 Atelectasis: Secondary | ICD-10-CM | POA: Insufficient documentation

## 2016-04-21 DIAGNOSIS — J189 Pneumonia, unspecified organism: Secondary | ICD-10-CM

## 2016-04-21 DIAGNOSIS — B338 Other specified viral diseases: Secondary | ICD-10-CM | POA: Diagnosis not present

## 2016-04-21 DIAGNOSIS — R05 Cough: Secondary | ICD-10-CM | POA: Insufficient documentation

## 2016-04-21 DIAGNOSIS — B348 Other viral infections of unspecified site: Secondary | ICD-10-CM

## 2016-04-21 DIAGNOSIS — M47814 Spondylosis without myelopathy or radiculopathy, thoracic region: Secondary | ICD-10-CM | POA: Insufficient documentation

## 2016-04-21 LAB — CBC WITH DIFFERENTIAL/PLATELET
BASOS ABS: 0.1 10*3/uL (ref 0.0–0.1)
Basophils Relative: 0 %
EOS ABS: 0.2 10*3/uL (ref 0.0–0.7)
EOS PCT: 1 %
HCT: 46.8 % (ref 39.0–52.0)
Hemoglobin: 16.1 g/dL (ref 13.0–17.0)
LYMPHS PCT: 12 %
Lymphs Abs: 2 10*3/uL (ref 0.7–4.0)
MCH: 31 pg (ref 26.0–34.0)
MCHC: 34.4 g/dL (ref 30.0–36.0)
MCV: 90 fL (ref 78.0–100.0)
MONO ABS: 1 10*3/uL (ref 0.1–1.0)
Monocytes Relative: 6 %
Neutro Abs: 12.7 10*3/uL — ABNORMAL HIGH (ref 1.7–7.7)
Neutrophils Relative %: 81 %
PLATELETS: 199 10*3/uL (ref 150–400)
RBC: 5.2 MIL/uL (ref 4.22–5.81)
RDW: 13.4 % (ref 11.5–15.5)
WBC: 15.9 10*3/uL — ABNORMAL HIGH (ref 4.0–10.5)

## 2016-04-21 LAB — BASIC METABOLIC PANEL
ANION GAP: 8 (ref 5–15)
BUN: 19 mg/dL (ref 6–20)
CALCIUM: 9.1 mg/dL (ref 8.9–10.3)
CO2: 24 mmol/L (ref 22–32)
Chloride: 105 mmol/L (ref 101–111)
Creatinine, Ser: 1.28 mg/dL — ABNORMAL HIGH (ref 0.61–1.24)
GFR calc Af Amer: >60 mL/min (ref >60)
GFR, EST NON AFRICAN AMERICAN: 53 mL/min — AB (ref >60)
GLUCOSE: 131 mg/dL — AB (ref 65–99)
Potassium: 4.1 mmol/L (ref 3.5–5.1)
SODIUM: 137 mmol/L (ref 135–145)

## 2016-04-21 MED ORDER — CEFTRIAXONE SODIUM 1 G IJ SOLR
500.0000 mg | Freq: Once | INTRAMUSCULAR | Status: AC
  Administered 2016-04-21: 500 mg via INTRAMUSCULAR

## 2016-04-21 MED ORDER — AZITHROMYCIN 250 MG PO TABS: ORAL_TABLET | ORAL | 0 refills | Status: DC

## 2016-04-21 NOTE — Progress Notes (Signed)
   Subjective:    Patient ID: Jesse Cordova, male    DOB: 12/04/1940, 75 y.o.   MRN: [id number]  Cough  This is a new problem. The current episode started in the past 7 days. Associated symptoms include a fever, headaches, nasal congestion, a sore throat and wheezing. He has tried OTC cough suppressant (tylenol) for the symptoms.   Patient with three-day history of head congestion drainage coughing wheezing some fever chills sweats denies muscle aches denies any severe headaches. Low energy. Recently gone through cancer treatments.   Review of Systems  Constitutional: Positive for fever.  HENT: Positive for sore throat.   Respiratory: Positive for cough and wheezing.   Neurological: Positive for headaches.  See above.     Objective:   Physical Exam Eardrums normal throat is normal neck supple frequent cough noted chest congestion noted heart regular O2 sats ration 97%       Assessment & Plan:  Possible early community-acquired pneumonia Parainfluenza Z-Pak Rocephin 500 IM Stat labs and stat x-ray May need hospitalization if gets worse 25 minutes was spent with the patient. Greater than half the time was spent in discussion and answering questions and counseling regarding the issues that the patient came in for today.

## 2016-04-22 ENCOUNTER — Ambulatory Visit (INDEPENDENT_AMBULATORY_CARE_PROVIDER_SITE_OTHER): Payer: Medicare Other | Admitting: Family Medicine

## 2016-04-22 ENCOUNTER — Encounter: Payer: Self-pay | Admitting: Family Medicine

## 2016-04-22 VITALS — BP 100/68 | Temp 97.2°F | Wt 187.2 lb

## 2016-04-22 DIAGNOSIS — J189 Pneumonia, unspecified organism: Secondary | ICD-10-CM | POA: Diagnosis not present

## 2016-04-22 NOTE — Progress Notes (Signed)
   Subjective:    Patient ID: Jesse Cordova, male    DOB: [birth year], 75 y.o.   MRN: [id number]  HPI Patient arrives for a follow up on recent diagnosis of pneumonia. Patient states overnight he had some chills some fevers but the he denies any sweats. Denies nausea vomiting or diarrhea. States he's breathing better today.  Review of Systems Relates coughing some chills low-grade fevers. No vomiting or diarrhea.    Objective:   Physical Exam Lungs clear hearts regular HEENT is benign x-rays labs reviewed with patient       Assessment & Plan:  Pneumonia recheck actually doing very well much better than what I expected. Labs reviewed with family in addition to this patient encouraged to eat as best as possible take it easy the next few days should gradually be getting better by early in the week if worse as we goes on follow-up immediately

## 2016-04-28 ENCOUNTER — Telehealth: Payer: Self-pay | Admitting: Family Medicine

## 2016-04-28 ENCOUNTER — Encounter: Payer: Self-pay | Admitting: Family Medicine

## 2016-04-28 ENCOUNTER — Ambulatory Visit (INDEPENDENT_AMBULATORY_CARE_PROVIDER_SITE_OTHER): Payer: Medicare Other | Admitting: Family Medicine

## 2016-04-28 VITALS — BP 120/82 | Temp 98.0°F | Ht 73.0 in | Wt 189.0 lb

## 2016-04-28 DIAGNOSIS — J189 Pneumonia, unspecified organism: Secondary | ICD-10-CM

## 2016-04-28 MED ORDER — AMOXICILLIN-POT CLAVULANATE 875-125 MG PO TABS: 1.0000 | ORAL_TABLET | Freq: Two times a day (BID) | ORAL | 0 refills | Status: DC

## 2016-04-28 NOTE — Telephone Encounter (Signed)
Talked with granddaughter Erline Levine. She states he called her and told her he wanted appt for recheck. States he is not feeling well. A lot of coughing. Transferred to front to schedule ov today.

## 2016-04-28 NOTE — Progress Notes (Signed)
   Subjective:    Patient ID: Jesse Cordova, male    DOB: 1941-05-13, 75 y.o.   MRN: [id number]  HPI Patient arrives for a follow up on pneumonia. Patient states he is feeling a little better but not much. Patient relates poor appetite he is forcing himself to eat. He gets winded when he tries to do things. He denies any chest pressure. Coughing up some phlegm. States some chills but no high fevers. Just feels bad. Low energy.  Review of Systems  Constitutional: Negative for activity change, appetite change, fatigue and fever.  HENT: Positive for rhinorrhea. Negative for congestion and ear pain.   Eyes: Negative for discharge.  Respiratory: Positive for cough and shortness of breath. Negative for wheezing.   Cardiovascular: Negative for chest pain.  Gastrointestinal: Negative for abdominal pain.  Endocrine: Negative for polydipsia and polyphagia.  Neurological: Negative for weakness.  Psychiatric/Behavioral: Negative for confusion.       Objective:   Physical Exam  Constitutional: He appears well-developed and well-nourished. No distress.  HENT:  Head: Normocephalic.  Mouth/Throat: Oropharynx is clear and moist. No oropharyngeal exudate.  Neck: Normal range of motion.  Cardiovascular: Normal rate, regular rhythm and normal heart sounds.   No murmur heard. Pulmonary/Chest: Effort normal and breath sounds normal. No respiratory distress. He has no wheezes.  Musculoskeletal: He exhibits no edema.  Lymphadenopathy:    He has no cervical adenopathy.  Neurological: He is alert. He exhibits normal muscle tone.  Skin: Skin is warm and dry.  Psychiatric: His behavior is normal.  Nursing note and vitals reviewed.  O2 sats ration 97% he walked one lap in our office in his O2 saturation was still good. I did not hear any crackles or rails.       Assessment & Plan:  We will recheck his blood work by looking at kidney function as well as white blood count Additional round of  antibiotics Augmentin 875 one twice a day ongoing for the next 10 days Recheck in approximately 2 weeks Await the results of lab work he will get it drawn today we will know by tomorrow

## 2016-04-28 NOTE — Telephone Encounter (Signed)
Pt is still not feeling well. Family is wanting to know what else can be done.

## 2016-04-29 LAB — CBC WITH DIFFERENTIAL/PLATELET
BASOS ABS: 0.1 10*3/uL (ref 0.0–0.2)
Basos: 1 %
EOS (ABSOLUTE): 0.2 10*3/uL (ref 0.0–0.4)
Eos: 2 %
Hematocrit: 43.8 % (ref 37.5–51.0)
Hemoglobin: 14.9 g/dL (ref 12.6–17.7)
IMMATURE GRANS (ABS): 0.1 10*3/uL (ref 0.0–0.1)
IMMATURE GRANULOCYTES: 1 %
LYMPHS: 23 %
Lymphocytes Absolute: 2.1 10*3/uL (ref 0.7–3.1)
MCH: 30.3 pg (ref 26.6–33.0)
MCHC: 34 g/dL (ref 31.5–35.7)
MCV: 89 fL (ref 79–97)
Monocytes Absolute: 0.6 10*3/uL (ref 0.1–0.9)
Monocytes: 7 %
NEUTROS PCT: 66 %
Neutrophils Absolute: 6.1 10*3/uL (ref 1.4–7.0)
PLATELETS: 220 10*3/uL (ref 150–379)
RBC: 4.91 x10E6/uL (ref 4.14–5.80)
RDW: 13.9 % (ref 12.3–15.4)
WBC: 9.1 10*3/uL (ref 3.4–10.8)

## 2016-04-29 LAB — BASIC METABOLIC PANEL
BUN/Creatinine Ratio: 19 (ref 10–24)
BUN: 20 mg/dL (ref 8–27)
CALCIUM: 8.9 mg/dL (ref 8.6–10.2)
CHLORIDE: 102 mmol/L (ref 96–106)
CO2: 20 mmol/L (ref 18–29)
Creatinine, Ser: 1.06 mg/dL (ref 0.76–1.27)
GFR calc Af Amer: 78 mL/min/{1.73_m2} (ref >59)
GFR calc non Af Amer: 68 mL/min/{1.73_m2} (ref >59)
Glucose: 263 mg/dL — ABNORMAL HIGH (ref 65–99)
POTASSIUM: 4.5 mmol/L (ref 3.5–5.2)
Sodium: 141 mmol/L (ref 134–144)

## 2016-05-12 ENCOUNTER — Encounter: Payer: Self-pay | Admitting: Family Medicine

## 2016-05-12 ENCOUNTER — Ambulatory Visit (INDEPENDENT_AMBULATORY_CARE_PROVIDER_SITE_OTHER): Payer: Medicare Other | Admitting: Family Medicine

## 2016-05-12 ENCOUNTER — Other Ambulatory Visit: Payer: Self-pay

## 2016-05-12 ENCOUNTER — Ambulatory Visit (HOSPITAL_COMMUNITY)
Admission: RE | Admit: 2016-05-12 | Discharge: 2016-05-12 | Disposition: A | Discharge status: Home / Self Care | Payer: Medicare Other | Source: Ambulatory Visit | Attending: Family Medicine | Admitting: Family Medicine

## 2016-05-12 VITALS — BP 102/76 | HR 76 | Temp 97.6°F | Ht 73.0 in | Wt 187.6 lb

## 2016-05-12 DIAGNOSIS — I700 Atherosclerosis of aorta: Secondary | ICD-10-CM | POA: Diagnosis not present

## 2016-05-12 DIAGNOSIS — E119 Type 2 diabetes mellitus without complications: Secondary | ICD-10-CM

## 2016-05-12 DIAGNOSIS — E784 Other hyperlipidemia: Secondary | ICD-10-CM

## 2016-05-12 DIAGNOSIS — I10 Essential (primary) hypertension: Secondary | ICD-10-CM | POA: Diagnosis not present

## 2016-05-12 DIAGNOSIS — Z794 Long term (current) use of insulin: Secondary | ICD-10-CM | POA: Diagnosis not present

## 2016-05-12 DIAGNOSIS — J189 Pneumonia, unspecified organism: Secondary | ICD-10-CM

## 2016-05-12 DIAGNOSIS — Z8701 Personal history of pneumonia (recurrent): Secondary | ICD-10-CM | POA: Diagnosis not present

## 2016-05-12 DIAGNOSIS — Z09 Encounter for follow-up examination after completed treatment for conditions other than malignant neoplasm: Secondary | ICD-10-CM | POA: Diagnosis present

## 2016-05-12 DIAGNOSIS — E7849 Other hyperlipidemia: Secondary | ICD-10-CM

## 2016-05-12 MED ORDER — CEFPROZIL 250 MG PO TABS: 250.0000 mg | ORAL_TABLET | Freq: Two times a day (BID) | ORAL | 0 refills | Status: DC

## 2016-05-12 NOTE — Progress Notes (Signed)
   Subjective:    Patient ID: Jesse Cordova, male    DOB: 27-May-1941, 75 y.o.   MRN: [id number]  HPI   Patient in office today for 2 week follow up on pneumonia.  Pt states he feels "ok".  He c/o occasional congestion and cough.  Patient states cough worsens at night.  This patient has taking the antibiotics. He finds himself feeling somewhat rundown fatigue tiredness gets out of breath with activity poor appetite trying to eat better  Review of Systems  Constitutional: Negative for activity change, appetite change, fatigue and fever.  HENT: Positive for rhinorrhea. Negative for congestion and ear pain.   Eyes: Negative for discharge.  Respiratory: Negative for cough and wheezing.   Cardiovascular: Negative for chest pain.  Gastrointestinal: Negative for abdominal pain.  Endocrine: Negative for polydipsia and polyphagia.  Neurological: Negative for weakness.  Psychiatric/Behavioral: Negative for confusion.       Objective:   Physical Exam  Constitutional: He appears well-nourished. No distress.  Cardiovascular: Normal rate, regular rhythm and normal heart sounds.   No murmur heard. Pulmonary/Chest: Effort normal and breath sounds normal. No respiratory distress.  Musculoskeletal: He exhibits no edema.  Lymphadenopathy:    He has no cervical adenopathy.  Neurological: He is alert.  Psychiatric: His behavior is normal.  Vitals reviewed.         Assessment & Plan:  Recent pneumonia still with ongoing coughing congestion not feeling good will go ahead with chest x-ray possibly will need additional antibiotic follow-up if not improving over the course of next 2 weeks  Does need some basic lab work completed. He is seeing his cancer specialist tomorrow at [facility]

## 2016-05-20 LAB — BASIC METABOLIC PANEL
BUN/Creatinine Ratio: 18 (ref 10–24)
BUN: 22 mg/dL (ref 8–27)
CALCIUM: 9.3 mg/dL (ref 8.6–10.2)
CHLORIDE: 102 mmol/L (ref 96–106)
CO2: 21 mmol/L (ref 18–29)
Creatinine, Ser: 1.2 mg/dL (ref 0.76–1.27)
GFR calc Af Amer: 67 mL/min/{1.73_m2} (ref >59)
GFR, EST NON AFRICAN AMERICAN: 58 mL/min/{1.73_m2} — AB (ref >59)
Glucose: 104 mg/dL — ABNORMAL HIGH (ref 65–99)
POTASSIUM: 4.4 mmol/L (ref 3.5–5.2)
Sodium: 141 mmol/L (ref 134–144)

## 2016-05-20 LAB — LIPID PANEL
Chol/HDL Ratio: 3.6 ratio units (ref 0.0–5.0)
Cholesterol, Total: 143 mg/dL (ref 100–199)
HDL: 40 mg/dL (ref >39)
LDL Calculated: 84 mg/dL (ref 0–99)
TRIGLYCERIDES: 96 mg/dL (ref 0–149)
VLDL CHOLESTEROL CAL: 19 mg/dL (ref 5–40)

## 2016-05-20 LAB — HEPATIC FUNCTION PANEL
ALK PHOS: 58 IU/L (ref 39–117)
ALT: 26 IU/L (ref 0–44)
AST: 23 IU/L (ref 0–40)
Albumin: 4.4 g/dL (ref 3.5–4.8)
BILIRUBIN, DIRECT: 0.17 mg/dL (ref 0.00–0.40)
Bilirubin Total: 0.6 mg/dL (ref 0.0–1.2)
Total Protein: 7.2 g/dL (ref 6.0–8.5)

## 2016-05-20 LAB — HEMOGLOBIN A1C
ESTIMATED AVERAGE GLUCOSE: 160 mg/dL
HEMOGLOBIN A1C: 7.2 % — AB (ref 4.8–5.6)

## 2016-06-10 ENCOUNTER — Encounter: Payer: Self-pay | Admitting: Family Medicine

## 2016-06-12 ENCOUNTER — Encounter: Payer: Self-pay | Admitting: Family Medicine

## 2016-06-12 ENCOUNTER — Ambulatory Visit (INDEPENDENT_AMBULATORY_CARE_PROVIDER_SITE_OTHER): Payer: Medicare Other | Admitting: Family Medicine

## 2016-06-12 VITALS — BP 110/76 | Ht 73.0 in | Wt 188.4 lb

## 2016-06-12 DIAGNOSIS — E119 Type 2 diabetes mellitus without complications: Secondary | ICD-10-CM | POA: Diagnosis not present

## 2016-06-12 DIAGNOSIS — C4A30 Merkel cell carcinoma of unspecified part of face: Secondary | ICD-10-CM | POA: Diagnosis not present

## 2016-06-12 DIAGNOSIS — Z794 Long term (current) use of insulin: Secondary | ICD-10-CM | POA: Diagnosis not present

## 2016-06-12 DIAGNOSIS — E784 Other hyperlipidemia: Secondary | ICD-10-CM

## 2016-06-12 DIAGNOSIS — I10 Essential (primary) hypertension: Secondary | ICD-10-CM | POA: Diagnosis not present

## 2016-06-12 DIAGNOSIS — E7849 Other hyperlipidemia: Secondary | ICD-10-CM

## 2016-06-12 DIAGNOSIS — I350 Nonrheumatic aortic (valve) stenosis: Secondary | ICD-10-CM

## 2016-06-12 MED ORDER — DIPHENOXYLATE-ATROPINE 2.5-0.025 MG PO TABS: ORAL_TABLET | ORAL | 3 refills | Status: DC

## 2016-06-12 MED ORDER — EMPAGLIFLOZIN 25 MG PO TABS: 25.0000 mg | ORAL_TABLET | Freq: Every day | ORAL | 5 refills | Status: DC

## 2016-06-12 MED ORDER — AZITHROMYCIN 250 MG PO TABS: ORAL_TABLET | ORAL | 0 refills | Status: DC

## 2016-06-12 NOTE — Progress Notes (Signed)
Subjective:    Patient ID: Jesse Cordova, male    DOB: 1940/12/10, 76 y.o.   MRN: [id number]  Diabetes  He presents for his follow-up diabetic visit. He has type 2 diabetes mellitus. There are no hypoglycemic associated symptoms. There are no diabetic associated symptoms. There are no hypoglycemic complications. There are no diabetic complications. There are no known risk factors for coronary artery disease. Current diabetic treatment includes insulin injections. He is compliant with treatment all of the time.    Patient has a sore throat and runny nose. Onset 1 day ago. Denies high fever chills sweats nausea vomiting   Patient with intermittent diarrhea not bloody or mucousy  HTN overall under good control with medications  Reflux under good control with medication  Takes cholesterol medicine without trouble. Denies side effects with it  Blood sugars were reviewed in detail with the patient morning sugars in the 100-120 range later in the day 1 7200    Results for orders placed or performed in visit on [id number]  Basic Metabolic Panel (BMET)  Result Value Ref Range   Glucose 104 (H) 65 - 99 mg/dL   BUN 22 8 - 27 mg/dL   Creatinine, Ser 1.20 0.76 - 1.27 mg/dL   GFR calc non Af Amer 58 (L) >59 mL/min/1.73   GFR calc Af Amer 67 >59 mL/min/1.73   BUN/Creatinine Ratio 18 10 - 24   Sodium 141 134 - 144 mmol/L   Potassium 4.4 3.5 - 5.2 mmol/L   Chloride 102 96 - 106 mmol/L   CO2 21 18 - 29 mmol/L   Calcium 9.3 8.6 - 10.2 mg/dL  Lipid Profile  Result Value Ref Range   Cholesterol, Total 143 100 - 199 mg/dL   Triglycerides 96 0 - 149 mg/dL   HDL 40 >39 mg/dL   VLDL Cholesterol Cal 19 5 - 40 mg/dL   LDL Calculated 84 0 - 99 mg/dL   Chol/HDL Ratio 3.6 0.0 - 5.0 ratio units  Hepatic function panel  Result Value Ref Range   Total Protein 7.2 6.0 - 8.5 g/dL   Albumin 4.4 3.5 - 4.8 g/dL   Bilirubin Total 0.6 0.0 - 1.2 mg/dL   Bilirubin, Direct 0.17 0.00 - 0.40 mg/dL   Alkaline  Phosphatase 58 39 - 117 IU/L   AST 23 0 - 40 IU/L   ALT 26 0 - 44 IU/L  HgB A1c  Result Value Ref Range   Hgb A1c MFr Bld 7.2 (H) 4.8 - 5.6 %   Est. average glucose Bld gHb Est-mCnc 160 mg/dL    .Review of Systems He denies any chest tightness pressure pain he does relate some runny nose and cough denies wheezing or difficulty breathing denies shortness of breath energy level is fair appetite is good denies being depressed    Objective:   Physical Exam Neck no masses no sign of any tumor reoccurrence lungs are clear no crackles heart is regular extremities no edema skin warm dry diabetic foot exam noted   Aortic stenosis murmur noted    Assessment & Plan:  Known history of aortic stenosis no shortness of breath with this note tightness or pain  HTN good control continue current measures  Diabetes his evening sugars are higher than what we would like to see. I recommend increasing Jardiance new dose 25 mg patient was warned that if it sugars start coming low he will need to reduce the glipizide  Merkel cell carcinoma the face there is no  sign reoccurrence followed by specialist on a regular basis  Hyperlipidemia previous labs reviewed continue current measures recent labs reviewed with patient  Under some stress because his wife is now being told that she has essentially a condition that palliative care only is recommended

## 2016-06-12 NOTE — Patient Instructions (Addendum)
Your morning sugars overall look good  The sugars before supper time are occasionally higher than what I would like to see  Continue glipizide at the low dose, continue insulin in the evening time 20 units  Increased Jardiance new dose 25 mg. Pay close attention to how your sugars are in the morning. If they start dropping in the low range we may need to reduce the amount of insulin. If they start dropping during the day we will need to reduce your glipizide. It is important to repeat your met 7 in approximately 2 weeks. It is also important to send [place] readings on your sugars every few weeks. Please keep follow-up visit in April  Use Zpack if cold gets worse,follow up if problems

## 2016-06-13 ENCOUNTER — Telehealth: Payer: Self-pay | Admitting: Pediatrics

## 2016-06-13 NOTE — Telephone Encounter (Signed)
Jesse Cordova from [place] called. She states she is calling regarding PA for lomotil. She states for PA to be approved I must answer 1 question.  She asked, "Does the provider acknowledge that this drug has been identified by the Centers for Medicare and Medicaid Services as a high risk medication in the 23 and older population?    I advised her provider is aware. She voiced understanding and states the PA is approved.

## 2016-06-16 ENCOUNTER — Ambulatory Visit: Payer: Medicare Other | Admitting: Family Medicine

## 2016-07-04 ENCOUNTER — Other Ambulatory Visit: Payer: Self-pay | Admitting: Family Medicine

## 2016-08-11 ENCOUNTER — Other Ambulatory Visit: Payer: Self-pay | Admitting: Family Medicine

## 2016-08-20 ENCOUNTER — Other Ambulatory Visit: Payer: Self-pay | Admitting: Family Medicine

## 2016-09-03 ENCOUNTER — Other Ambulatory Visit: Payer: Self-pay | Admitting: Family Medicine

## 2016-09-10 ENCOUNTER — Ambulatory Visit: Payer: Medicare Other | Admitting: Family Medicine

## 2016-09-12 ENCOUNTER — Ambulatory Visit: Payer: Medicare Other | Admitting: Family Medicine

## 2016-09-18 ENCOUNTER — Encounter: Payer: Self-pay | Admitting: Family Medicine

## 2016-09-18 ENCOUNTER — Ambulatory Visit (INDEPENDENT_AMBULATORY_CARE_PROVIDER_SITE_OTHER): Payer: Medicare Other | Admitting: Family Medicine

## 2016-09-18 VITALS — BP 110/62 | Ht 73.0 in | Wt 185.2 lb

## 2016-09-18 DIAGNOSIS — E784 Other hyperlipidemia: Secondary | ICD-10-CM

## 2016-09-18 DIAGNOSIS — C4A30 Merkel cell carcinoma of unspecified part of face: Secondary | ICD-10-CM

## 2016-09-18 DIAGNOSIS — E7849 Other hyperlipidemia: Secondary | ICD-10-CM

## 2016-09-18 DIAGNOSIS — I10 Essential (primary) hypertension: Secondary | ICD-10-CM | POA: Diagnosis not present

## 2016-09-18 DIAGNOSIS — E119 Type 2 diabetes mellitus without complications: Secondary | ICD-10-CM | POA: Diagnosis not present

## 2016-09-18 DIAGNOSIS — E1142 Type 2 diabetes mellitus with diabetic polyneuropathy: Secondary | ICD-10-CM

## 2016-09-18 LAB — POCT GLYCOSYLATED HEMOGLOBIN (HGB A1C): HEMOGLOBIN A1C: 6.5

## 2016-09-18 NOTE — Progress Notes (Addendum)
   Subjective:    Patient ID: Jesse Cordova, male    DOB: 08/02/1940, 76 y.o.   MRN: [id number]  Hypertension  This is a chronic problem. The current episode started more than 1 year ago. Risk factors for coronary artery disease include diabetes mellitus, dyslipidemia and male gender. Treatments tried: metoprolol. There are no compliance problems.   Patient taken cholesterol medicine on regular basis watching diet appropriately Takes his reflux medicine keeps things under good control There does appear to be some slight confusion regarding his Vania Rea we will try to clarify with family which dose he is taking so we can clarify this with his pharmacy  Patient does relate some intermittent burning in the feet. It is tolerable.  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 Greater than half the time was spent in discussion and answering questions and counseling regarding the issues that the patient came in for today. Patient has diabetic neuropathy of the feet. Monofilament test failed, pre-ulcerative calluses noted, would qualify for diabetic shoes.     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

## 2016-09-21 ENCOUNTER — Encounter: Payer: Self-pay | Admitting: Family Medicine

## 2016-09-23 NOTE — Telephone Encounter (Signed)
Nurse's-patient may have a prescription for diabetic shoes due to diabetic neuropathy

## 2016-09-24 ENCOUNTER — Other Ambulatory Visit: Payer: Self-pay | Admitting: *Deleted

## 2016-10-10 ENCOUNTER — Other Ambulatory Visit: Payer: Self-pay | Admitting: Family Medicine

## 2016-11-12 ENCOUNTER — Encounter: Payer: Self-pay | Admitting: Family Medicine

## 2016-11-13 ENCOUNTER — Telehealth: Payer: Self-pay | Admitting: Family Medicine

## 2016-11-13 NOTE — Telephone Encounter (Signed)
Review MRI Brain Wo Contrast results in results folder.

## 2016-11-16 NOTE — Telephone Encounter (Signed)
According to the note from his daughter that was sent via my chart they will be getting his MRI through his specialists to avoid all the extra hoops

## 2016-12-10 ENCOUNTER — Other Ambulatory Visit: Payer: Self-pay | Admitting: Family Medicine

## 2016-12-12 ENCOUNTER — Other Ambulatory Visit: Payer: Self-pay | Admitting: Family Medicine

## 2016-12-22 ENCOUNTER — Encounter: Payer: Self-pay | Admitting: Family Medicine

## 2017-01-11 ENCOUNTER — Other Ambulatory Visit: Payer: Self-pay | Admitting: Family Medicine

## 2017-01-22 ENCOUNTER — Encounter: Payer: Self-pay | Admitting: Family Medicine

## 2017-01-22 ENCOUNTER — Ambulatory Visit (INDEPENDENT_AMBULATORY_CARE_PROVIDER_SITE_OTHER): Payer: Medicare Other | Admitting: Family Medicine

## 2017-01-22 VITALS — BP 142/74 | Ht 73.0 in | Wt 185.0 lb

## 2017-01-22 DIAGNOSIS — E119 Type 2 diabetes mellitus without complications: Secondary | ICD-10-CM

## 2017-01-22 DIAGNOSIS — N289 Disorder of kidney and ureter, unspecified: Secondary | ICD-10-CM

## 2017-01-22 LAB — POCT GLYCOSYLATED HEMOGLOBIN (HGB A1C): Hemoglobin A1C: 6

## 2017-01-22 MED ORDER — BENZONATATE 200 MG PO CAPS: ORAL_CAPSULE | ORAL | 12 refills | Status: DC

## 2017-01-22 NOTE — Progress Notes (Signed)
   Subjective:    Patient ID: Jesse Cordova, male    DOB: 1941/05/31, 76 y.o.   MRN: [id number]  Diabetes  He presents for his follow-up diabetic visit. He has type 2 diabetes mellitus. Pertinent negatives for hypoglycemia include no confusion. Pertinent negatives for diabetes include no chest pain, no fatigue, no polydipsia, no polyphagia and no weakness. Current diabetic treatments: lantus,glipizide. He is following a generally unhealthy diet. He has not had a previous visit with a dietitian. He rarely participates in exercise. He does not see a podiatrist.Eye exam is current.  Wants Rx for tesslon perles.   Review of Systems  Constitutional: Negative for activity change, appetite change and fatigue.  HENT: Negative for congestion.   Respiratory: Negative for cough.   Cardiovascular: Negative for chest pain.  Gastrointestinal: Negative for abdominal pain.  Endocrine: Negative for polydipsia and polyphagia.  Neurological: Negative for weakness.  Psychiatric/Behavioral: Negative for confusion.       Results for orders placed or performed in visit on 01/22/17  POCT glycosylated hemoglobin (Hb A1C)  Result Value Ref Range   Hemoglobin A1C 6.0     Objective:   Physical Exam  Constitutional: He appears well-nourished. No distress.  Cardiovascular: Normal rate, regular rhythm and normal heart sounds.   No murmur heard. Pulmonary/Chest: Effort normal and breath sounds normal. No respiratory distress.  Musculoskeletal: He exhibits no edema.  Lymphadenopathy:    He has no cervical adenopathy.  Neurological: He is alert.  Psychiatric: His behavior is normal.  Vitals reviewed.         Assessment & Plan:  Diabetes very good control continue current measures do not increase medications follow-up in approximately 4 months  Mild renal insufficiency check metabolic 7

## 2017-01-24 LAB — BASIC METABOLIC PANEL
BUN/Creatinine Ratio: 15 (ref 10–24)
BUN: 19 mg/dL (ref 8–27)
CALCIUM: 9 mg/dL (ref 8.6–10.2)
CO2: 19 mmol/L — ABNORMAL LOW (ref 20–29)
CREATININE: 1.23 mg/dL (ref 0.76–1.27)
Chloride: 104 mmol/L (ref 96–106)
GFR, EST AFRICAN AMERICAN: 65 mL/min/{1.73_m2} (ref >59)
GFR, EST NON AFRICAN AMERICAN: 56 mL/min/{1.73_m2} — AB (ref >59)
Glucose: 165 mg/dL — ABNORMAL HIGH (ref 65–99)
Potassium: 4 mmol/L (ref 3.5–5.2)
Sodium: 140 mmol/L (ref 134–144)

## 2017-01-30 ENCOUNTER — Other Ambulatory Visit: Payer: Self-pay | Admitting: Family Medicine

## 2017-02-04 ENCOUNTER — Other Ambulatory Visit: Payer: Self-pay | Admitting: *Deleted

## 2017-02-04 MED ORDER — INSULIN PEN NEEDLE 31G X 8 MM MISC: 0 refills | Status: DC

## 2017-02-11 ENCOUNTER — Other Ambulatory Visit: Payer: Self-pay | Admitting: Family Medicine

## 2017-03-11 ENCOUNTER — Ambulatory Visit (INDEPENDENT_AMBULATORY_CARE_PROVIDER_SITE_OTHER): Payer: Medicare Other | Admitting: *Deleted

## 2017-03-11 DIAGNOSIS — Z23 Encounter for immunization: Secondary | ICD-10-CM

## 2017-03-13 ENCOUNTER — Other Ambulatory Visit: Payer: Self-pay | Admitting: Family Medicine

## 2017-03-29 ENCOUNTER — Other Ambulatory Visit: Payer: Self-pay | Admitting: Family Medicine

## 2017-05-14 ENCOUNTER — Other Ambulatory Visit: Payer: Self-pay | Admitting: Family Medicine

## 2017-06-11 ENCOUNTER — Other Ambulatory Visit: Payer: Self-pay | Admitting: Family Medicine

## 2017-06-12 ENCOUNTER — Other Ambulatory Visit: Payer: Self-pay | Admitting: Family Medicine

## 2017-06-24 ENCOUNTER — Ambulatory Visit: Payer: Medicare Other | Admitting: Family Medicine

## 2017-07-06 ENCOUNTER — Ambulatory Visit: Payer: Medicare Other | Admitting: Family Medicine

## 2017-08-04 ENCOUNTER — Other Ambulatory Visit: Payer: Self-pay | Admitting: Family Medicine

## 2017-08-11 ENCOUNTER — Other Ambulatory Visit: Payer: Self-pay | Admitting: Family Medicine

## 2017-08-19 ENCOUNTER — Other Ambulatory Visit: Payer: Self-pay | Admitting: *Deleted

## 2017-08-19 ENCOUNTER — Encounter: Payer: Self-pay | Admitting: Family Medicine

## 2017-08-19 MED ORDER — BLOOD GLUCOSE MONITOR KIT: PACK | 5 refills | Status: DC

## 2017-08-27 ENCOUNTER — Telehealth: Payer: Self-pay | Admitting: Family Medicine

## 2017-08-27 DIAGNOSIS — E119 Type 2 diabetes mellitus without complications: Secondary | ICD-10-CM

## 2017-08-27 DIAGNOSIS — Z79899 Other long term (current) drug therapy: Secondary | ICD-10-CM

## 2017-08-27 DIAGNOSIS — E785 Hyperlipidemia, unspecified: Secondary | ICD-10-CM

## 2017-08-27 NOTE — Telephone Encounter (Signed)
Pt is requesting lab orders to be sent over for an upcoming appt scheduled for the beginning of April with Dr. Nicki Reaper. Last labs per Epic were: bmp on 01/23/2017.

## 2017-08-27 NOTE — Telephone Encounter (Signed)
I called and left a message to r/c. 

## 2017-08-27 NOTE — Telephone Encounter (Signed)
Metabolic 7, lipid, liver, urine ACR, A1c

## 2017-08-28 NOTE — Telephone Encounter (Signed)
Granddaughter Erline Levine notified.

## 2017-09-01 LAB — BASIC METABOLIC PANEL
BUN / CREAT RATIO: 16 (ref 10–24)
BUN: 21 mg/dL (ref 8–27)
CHLORIDE: 108 mmol/L — AB (ref 96–106)
CO2: 20 mmol/L (ref 20–29)
Calcium: 9.3 mg/dL (ref 8.6–10.2)
Creatinine, Ser: 1.29 mg/dL — ABNORMAL HIGH (ref 0.76–1.27)
GFR calc Af Amer: 61 mL/min/{1.73_m2} (ref >59)
GFR calc non Af Amer: 53 mL/min/{1.73_m2} — ABNORMAL LOW (ref >59)
GLUCOSE: 108 mg/dL — AB (ref 65–99)
POTASSIUM: 4.3 mmol/L (ref 3.5–5.2)
Sodium: 146 mmol/L — ABNORMAL HIGH (ref 134–144)

## 2017-09-01 LAB — LIPID PANEL
CHOLESTEROL TOTAL: 137 mg/dL (ref 100–199)
Chol/HDL Ratio: 3.2 ratio (ref 0.0–5.0)
HDL: 43 mg/dL (ref >39)
LDL CALC: 80 mg/dL (ref 0–99)
Triglycerides: 68 mg/dL (ref 0–149)
VLDL Cholesterol Cal: 14 mg/dL (ref 5–40)

## 2017-09-01 LAB — HEPATIC FUNCTION PANEL
ALK PHOS: 64 IU/L (ref 39–117)
ALT: 14 IU/L (ref 0–44)
AST: 28 IU/L (ref 0–40)
Albumin: 4.6 g/dL (ref 3.5–4.8)
BILIRUBIN TOTAL: 0.5 mg/dL (ref 0.0–1.2)
Bilirubin, Direct: 0.17 mg/dL (ref 0.00–0.40)
Total Protein: 7.2 g/dL (ref 6.0–8.5)

## 2017-09-01 LAB — HEMOGLOBIN A1C
Est. average glucose Bld gHb Est-mCnc: 151 mg/dL
Hgb A1c MFr Bld: 6.9 % — ABNORMAL HIGH (ref 4.8–5.6)

## 2017-09-01 LAB — MICROALBUMIN / CREATININE URINE RATIO
Creatinine, Urine: 125.7 mg/dL
MICROALB/CREAT RATIO: 2.5 mg/g{creat} (ref 0.0–30.0)
Microalbumin, Urine: 3.2 ug/mL

## 2017-09-07 ENCOUNTER — Other Ambulatory Visit: Payer: Self-pay | Admitting: Family Medicine

## 2017-09-09 ENCOUNTER — Ambulatory Visit: Payer: Medicare Other | Admitting: Family Medicine

## 2017-09-09 ENCOUNTER — Encounter: Payer: Self-pay | Admitting: Family Medicine

## 2017-09-09 ENCOUNTER — Other Ambulatory Visit: Payer: Self-pay | Admitting: Family Medicine

## 2017-09-09 VITALS — BP 122/72 | Ht 70.0 in | Wt 183.0 lb

## 2017-09-09 DIAGNOSIS — E785 Hyperlipidemia, unspecified: Secondary | ICD-10-CM

## 2017-09-09 DIAGNOSIS — Z0000 Encounter for general adult medical examination without abnormal findings: Secondary | ICD-10-CM

## 2017-09-09 DIAGNOSIS — Z0001 Encounter for general adult medical examination with abnormal findings: Secondary | ICD-10-CM | POA: Diagnosis not present

## 2017-09-09 DIAGNOSIS — I350 Nonrheumatic aortic (valve) stenosis: Secondary | ICD-10-CM

## 2017-09-09 DIAGNOSIS — E119 Type 2 diabetes mellitus without complications: Secondary | ICD-10-CM | POA: Diagnosis not present

## 2017-09-09 DIAGNOSIS — N289 Disorder of kidney and ureter, unspecified: Secondary | ICD-10-CM | POA: Diagnosis not present

## 2017-09-09 DIAGNOSIS — I10 Essential (primary) hypertension: Secondary | ICD-10-CM | POA: Diagnosis not present

## 2017-09-09 MED ORDER — INSULIN GLARGINE 100 UNIT/ML SOLOSTAR PEN: PEN_INJECTOR | SUBCUTANEOUS | 3 refills | Status: DC

## 2017-09-09 MED ORDER — OMEPRAZOLE 20 MG PO CPDR: DELAYED_RELEASE_CAPSULE | ORAL | 1 refills | Status: DC

## 2017-09-09 MED ORDER — EMPAGLIFLOZIN 25 MG PO TABS: 25.0000 mg | ORAL_TABLET | Freq: Every day | ORAL | 5 refills | Status: DC

## 2017-09-09 MED ORDER — GLIPIZIDE 5 MG PO TABS: ORAL_TABLET | ORAL | 1 refills | Status: DC

## 2017-09-09 MED ORDER — LISINOPRIL 2.5 MG PO TABS: 2.5000 mg | ORAL_TABLET | Freq: Every day | ORAL | 5 refills | Status: DC

## 2017-09-09 MED ORDER — INSULIN GLARGINE 100 UNIT/ML SOLOSTAR PEN: PEN_INJECTOR | SUBCUTANEOUS | 5 refills | Status: DC

## 2017-09-09 MED ORDER — METOPROLOL SUCCINATE ER 25 MG PO TB24: 25.0000 mg | ORAL_TABLET | Freq: Every day | ORAL | 5 refills | Status: DC

## 2017-09-09 MED ORDER — LOVASTATIN 20 MG PO TABS: ORAL_TABLET | ORAL | 1 refills | Status: DC

## 2017-09-09 NOTE — Progress Notes (Addendum)
Subjective:    Patient ID: Jesse Cordova, male    DOB: 1940-07-31, 77 y.o.   MRN: [id number]  HPI  AWV- Annual Wellness Visit  The patient was seen for their annual wellness visit. The patient's past medical history, surgical history, and family history were reviewed. Pertinent vaccines were reviewed ( tetanus, pneumonia, shingles, flu) The patient's medication list was reviewed and updated.  The height and weight were entered.  BMI recorded in electronic record elsewhere  Cognitive screening was completed. Outcome of Mini - Cog: pass   Falls /depression screening electronically recorded within record elsewhere  Current tobacco usage:none (All patients who use tobacco were given written and verbal information on quitting)  Recent listing of emergency department/hospitalizations over the past year were reviewed.  current specialist the patient sees on a regular basis: dermatologist yearly   Medicare annual wellness visit patient questionnaire was reviewed.  A written screening schedule for the patient for the next 5-10 years was given. Appropriate discussion of followup regarding next visit was discussed.  The patient was seen today as part of a comprehensive diabetic check up.The patient relates medication compliance. No significant side effects to the medications. Denies any low glucose spells. Relates compliance with diet to a reasonable level. Patient does do labwork intermittently and understands the dangers of diabetes.   Patient here for follow-up regarding cholesterol.  Patient does try to maintain a reasonable diet.  Patient does take the medication on a regular basis.  Denies missing a dose.  The patient denies any obvious side effects.  Prior blood work results reviewed with the patient.  The patient is aware of his cholesterol goals and the need to keep it under good control to lessen the risk of disease. Patient with aortic stenosis does not do any Strenuous  activity last echo 2 years ago cardiology recommended follow-up in 1 year but patient got busy between his own health problems and his wife's health problems.    Review of Systems  Constitutional: Negative for activity change, appetite change and fever.  HENT: Negative for congestion and rhinorrhea.   Eyes: Negative for discharge.  Respiratory: Negative for cough and wheezing.   Cardiovascular: Negative for chest pain.  Gastrointestinal: Negative for abdominal pain, blood in stool and vomiting.  Genitourinary: Negative for difficulty urinating and frequency.  Musculoskeletal: Negative for neck pain.  Skin: Negative for rash.  Allergic/Immunologic: Negative for environmental allergies and food allergies.  Neurological: Negative for weakness and headaches.  Psychiatric/Behavioral: Negative for agitation.       Objective:   Physical Exam  Constitutional: He appears well-developed and well-nourished.  HENT:  Head: Normocephalic and atraumatic.  Right Ear: External ear normal.  Left Ear: External ear normal.  Nose: Nose normal.  Mouth/Throat: Oropharynx is clear and moist.  Eyes: Right eye exhibits no discharge. Left eye exhibits no discharge. No scleral icterus.  Neck: Normal range of motion. Neck supple. No thyromegaly present.  Cardiovascular: Normal rate, regular rhythm and normal heart sounds.  No murmur heard. Pulmonary/Chest: Effort normal and breath sounds normal. No respiratory distress. He has no wheezes.  Abdominal: Soft. Bowel sounds are normal. He exhibits no distension and no mass. There is no tenderness.  Genitourinary: Penis normal.  Musculoskeletal: Normal range of motion. He exhibits no edema.  Lymphadenopathy:    He has no cervical adenopathy.  Neurological: He is alert. He exhibits normal muscle tone. Coordination normal.  Skin: Skin is warm and dry. No erythema.  Psychiatric: He has a  normal mood and affect. His behavior is normal. Judgment normal.   Patient  has diabetes Patient has  significant neuropathy of the feet plus also pre-ulcerative callus regions Patient at high risk of ulceration with pre-ulcerative areas Would benefit from diabetic shoes.Patient with significant Vascular disease as well       Assessment & Plan:  Adult wellness-complete.wellness physical was conducted today. Importance of diet and exercise were discussed in detail. In addition to this a discussion regarding safety was also covered. We also reviewed over immunizations and gave recommendations regarding current immunization needed for age. In addition to this additional areas were also touched on including: Preventative health exams needed: Colonoscopy up-to-date  Patient was advised yearly wellness exam  Because of aortic stenosis patient does need referral back to cardiology for another echo patient at higher risk of stroke takes 81 mg aspirin  Lipids under good control continue current measures watch diet  Diabetes fairly good control we will monitor renal function follow-up on ongoing basis thank you  Renal insufficiency recommend following up closely recheck lab work again in 6 months time

## 2017-09-09 NOTE — Progress Notes (Signed)
Referral placed in Epic.

## 2017-09-09 NOTE — Patient Instructions (Addendum)
Please review over your medications at home.  If they differ from the list that we had printed with this office visit please let me know. You are due for a echo of your heart please make sure that you also do a eye exam every year .  Very important for you to follow-up with [place] again in approximately 5-6 months  Here are the results of your recent lab work Results for orders placed or performed in visit on 57/32/20  Basic metabolic panel  Result Value Ref Range   Glucose 108 (H) 65 - 99 mg/dL   BUN 21 8 - 27 mg/dL   Creatinine, Ser 1.29 (H) 0.76 - 1.27 mg/dL   GFR calc non Af Amer 53 (L) >59 mL/min/1.73   GFR calc Af Amer 61 >59 mL/min/1.73   BUN/Creatinine Ratio 16 10 - 24   Sodium 146 (H) 134 - 144 mmol/L   Potassium 4.3 3.5 - 5.2 mmol/L   Chloride 108 (H) 96 - 106 mmol/L   CO2 20 20 - 29 mmol/L   Calcium 9.3 8.6 - 10.2 mg/dL  Lipid panel  Result Value Ref Range   Cholesterol, Total 137 100 - 199 mg/dL   Triglycerides 68 0 - 149 mg/dL   HDL 43 >39 mg/dL   VLDL Cholesterol Cal 14 5 - 40 mg/dL   LDL Calculated 80 0 - 99 mg/dL   Chol/HDL Ratio 3.2 0.0 - 5.0 ratio  Hepatic function panel  Result Value Ref Range   Total Protein 7.2 6.0 - 8.5 g/dL   Albumin 4.6 3.5 - 4.8 g/dL   Bilirubin Total 0.5 0.0 - 1.2 mg/dL   Bilirubin, Direct 0.17 0.00 - 0.40 mg/dL   Alkaline Phosphatase 64 39 - 117 IU/L   AST 28 0 - 40 IU/L   ALT 14 0 - 44 IU/L  Microalbumin / creatinine urine ratio  Result Value Ref Range   Creatinine, Urine 125.7 Not Estab. mg/dL   Microalbumin, Urine 3.2 Not Estab. ug/mL   Microalb/Creat Ratio 2.5 0.0 - 30.0 mg/g creat  Hemoglobin A1c  Result Value Ref Range   Hgb A1c MFr Bld 6.9 (H) 4.8 - 5.6 %   Est. average glucose Bld gHb Est-mCnc 151 mg/dL

## 2017-09-10 ENCOUNTER — Encounter: Payer: Self-pay | Admitting: Family Medicine

## 2017-09-11 ENCOUNTER — Telehealth: Payer: Self-pay | Admitting: *Deleted

## 2017-09-11 DIAGNOSIS — I350 Nonrheumatic aortic (valve) stenosis: Secondary | ICD-10-CM

## 2017-09-11 NOTE — Telephone Encounter (Signed)
Received message from patient granddaughter. Patient needed follow up echo and office visit, he is past due. Echo order placed, will get scheduled, and follow up with Dr Oval Linsey made

## 2017-09-14 ENCOUNTER — Encounter: Payer: Self-pay | Admitting: Family Medicine

## 2017-09-25 ENCOUNTER — Encounter: Payer: Self-pay | Admitting: Podiatry

## 2017-09-25 ENCOUNTER — Ambulatory Visit: Payer: Medicare Other | Admitting: Podiatry

## 2017-09-25 VITALS — BP 133/66 | HR 77

## 2017-09-25 DIAGNOSIS — M79674 Pain in right toe(s): Secondary | ICD-10-CM

## 2017-09-25 DIAGNOSIS — E119 Type 2 diabetes mellitus without complications: Secondary | ICD-10-CM

## 2017-09-25 DIAGNOSIS — M79675 Pain in left toe(s): Secondary | ICD-10-CM

## 2017-09-25 DIAGNOSIS — E1142 Type 2 diabetes mellitus with diabetic polyneuropathy: Secondary | ICD-10-CM | POA: Diagnosis not present

## 2017-09-25 DIAGNOSIS — B351 Tinea unguium: Secondary | ICD-10-CM

## 2017-09-25 NOTE — Progress Notes (Signed)
This patient presents to the office with chief complaint of long ingrowing  nails and diabetic feet.  This patient  says he is having no pain and discomfort in his feet.  This patient says he has long ingrowing nails especially both big toes.  These nails are painful walking and wearing shoes.  He has no history of infection or drainage from both feet.  This patient presents the office today for treatment of the  long nails and a foot evaluation due to history of  diabetes.  General Appearance  Alert, conversant and in no acute stress.  Vascular  Dorsalis pedis and posterior tibial  pulses are palpable  bilaterally.  Capillary return is within normal limits  bilaterally. Temperature is within normal limits  bilaterally.  Neurologic  Senn-Weinstein monofilament wire test within normal limits  bilaterally. Muscle power within normal limits bilaterally.  Nails Thick disfigured discolored nails with subungual debris  hallux nails both feet with incurvation both borders hallux nails  B/L  Orthopedic  No limitations of motion of motion feet .  No crepitus or effusions noted.  HAV  B/L  Skin  normotropic skin with no porokeratosis noted bilaterally.  No signs of infections or ulcers noted.     Onychomycosis  Diabetes with no foot complications  IE  Debride nails x 2..  A diabetic foot exam was performed and there is no evidence of any vascular or neurologic pathology.   RTC 3 months.   Gardiner Barefoot DPM

## 2017-10-11 ENCOUNTER — Telehealth: Payer: Self-pay | Admitting: Family Medicine

## 2017-10-11 NOTE — Telephone Encounter (Signed)
erica-I received a request from [facility] for diabetic shoes.  Patient was seen on April 10.  At the end of April he saw podiatrist.  They did testing on his feet and he did not have neuropathy at that time.  But on his exam April 10 he did have deformity in his feet consistent with pre-ulcerative calluses plus also foot deformity.  Therefore I filled out the forms.  A copy of his dictation would need to be sent with him.  That is sent with this form.

## 2017-10-16 ENCOUNTER — Telehealth: Payer: Self-pay | Admitting: *Deleted

## 2017-10-16 NOTE — Telephone Encounter (Signed)
Aware dr Nicki Reaper is out of office til Tuesday. She said no rush

## 2017-10-16 NOTE — Telephone Encounter (Signed)
Jesse Cordova who works at [employer] and is pt's daughter called and said they got request for diabetic shoes and it had on form that patient does not have foot deformity. She said he does not have foot deformity. She wants the form changed she said he has poor circulation and history of pre ulcerative callus, and neuropathy. She sent over a new form. I was going to fill out nurse part but then a read last note and it does say in note that pt has severe deformity of both feet. Please advise if you want to change form. Form is in dr scott's folder.

## 2017-10-19 NOTE — Telephone Encounter (Signed)
I am sure this was some sort of typographical error.  I will review the form

## 2017-10-20 NOTE — Telephone Encounter (Signed)
The form was completed-please fax to [employer]

## 2017-10-21 NOTE — Telephone Encounter (Signed)
To you 

## 2017-11-10 ENCOUNTER — Other Ambulatory Visit: Payer: Self-pay

## 2017-11-10 ENCOUNTER — Ambulatory Visit (HOSPITAL_COMMUNITY): Payer: Medicare Other | Attending: Cardiovascular Disease

## 2017-11-10 DIAGNOSIS — I080 Rheumatic disorders of both mitral and aortic valves: Secondary | ICD-10-CM | POA: Insufficient documentation

## 2017-11-10 DIAGNOSIS — I420 Dilated cardiomyopathy: Secondary | ICD-10-CM | POA: Diagnosis not present

## 2017-11-10 DIAGNOSIS — I350 Nonrheumatic aortic (valve) stenosis: Secondary | ICD-10-CM | POA: Diagnosis present

## 2017-11-10 DIAGNOSIS — I5030 Unspecified diastolic (congestive) heart failure: Secondary | ICD-10-CM | POA: Insufficient documentation

## 2017-11-12 ENCOUNTER — Encounter: Payer: Self-pay | Admitting: Adult Health

## 2017-11-12 ENCOUNTER — Ambulatory Visit (INDEPENDENT_AMBULATORY_CARE_PROVIDER_SITE_OTHER): Payer: Medicare Other | Admitting: Adult Health

## 2017-11-12 VITALS — BP 132/68 | HR 68 | Ht 70.0 in | Wt 182.0 lb

## 2017-11-12 DIAGNOSIS — I350 Nonrheumatic aortic (valve) stenosis: Secondary | ICD-10-CM | POA: Diagnosis not present

## 2017-11-12 DIAGNOSIS — Z01811 Encounter for preprocedural respiratory examination: Secondary | ICD-10-CM

## 2017-11-12 DIAGNOSIS — Z79899 Other long term (current) drug therapy: Secondary | ICD-10-CM | POA: Diagnosis not present

## 2017-11-12 LAB — CBC
HEMOGLOBIN: 15.9 g/dL (ref 13.0–17.7)
Hematocrit: 46.2 % (ref 37.5–51.0)
MCH: 30.1 pg (ref 26.6–33.0)
MCHC: 34.4 g/dL (ref 31.5–35.7)
MCV: 88 fL (ref 79–97)
PLATELETS: 204 10*3/uL (ref 150–450)
RBC: 5.28 x10E6/uL (ref 4.14–5.80)
RDW: 13.8 % (ref 12.3–15.4)
WBC: 7.9 10*3/uL (ref 3.4–10.8)

## 2017-11-12 NOTE — Progress Notes (Signed)
Cardiology Office Note   Date:  11/12/2017   ID:  YSIDRO RAMSAY, DOB 11-18-1940, MRN [id number]  PCP:  [name], MD  Cardiologist:  Dr. Oval Linsey   Chief Complaint  Patient presents with  . Follow-up  . Chest Pain  . Shortness of Breath  . Aortic Stenosis    Abnormal echo     History of Present Illness: Jesse Cordova is a 77 y.o. male who presents for ongoing assessment and management of moderate aortic stenosis, diabetes, hypertension, hyperlipidemia,with other history to include GERD and Merkel cell carcinoma of the face. He has undergone radiation at [facility] for treatment.   He comes today at the request of Dr Oval Linsey for follow up due to echocardiogram completed on 10/31/2017 revealing worsening AoV stenosis to the severe range. He will need referral to the TAVR clinic. He has had complaints of chest pain, DOE and some dizziness but does not allow this to stop him. His niece  Genevieve Norlander, works at [employer] street office in [place].  He will need a cardiac cath prior to TAVR evaluation.    Past Medical History:  Diagnosis Date  . Adrenal nodule ([place]) 11/10/2011  . Arthritis   . Cancer ([place])    skin cancer  . Chronic cough   . Chronic low back pain   . Complication of anesthesia   . Diabetes mellitus   . GERD (gastroesophageal reflux disease)   . Hyperlipemia   . Hypertension   . Moderate aortic regurgitation 04/18/2016  . Pneumonia   . PONV (postoperative nausea and vomiting)     Past Surgical History:  Procedure Laterality Date  . BIOPSY  01/16/2012   Procedure: BIOPSY;  Surgeon: Rogene Houston, MD;  Location: [facility] ENDO SUITE;  Service: Endoscopy;  Laterality: N/A;  . CATARACT EXTRACTION Right 7/06  . CATARACT EXTRACTION W/PHACO Left 01/05/2014   Procedure: CATARACT EXTRACTION PHACO AND INTRAOCULAR LENS PLACEMENT (IOC);  Surgeon: Tonny Branch, MD;  Location: [facility] ORS;  Service: Ophthalmology;  Laterality: Left;  CDE:15.98  .  COLONOSCOPY  01/29/12   abnormal repeat in one year  . ESOPHAGOGASTRODUODENOSCOPY    . KNEE ARTHROSCOPY     2 right knee surgeries and 1 left knee surgery  . TONSILLECTOMY    . VASECTOMY       Current Outpatient Medications  Medication Sig Dispense Refill  . ACCU-CHEK AVIVA PLUS test strip USE TO TEST SUGAR TWICE DAILY 150 each 1  . ACCU-CHEK AVIVA PLUS test strip USE TO TEST SUGAR TWICE DAILY 100 each 5  . aspirin EC 81 MG tablet Take 81 mg by mouth daily.    . benzonatate (TESSALON) 200 MG capsule One three times daily as needed 30 capsule 12  . blood glucose meter kit and supplies KIT Dispense based on patient and insurance preference. Use to test glucose twice daily. (For ICD 10 code E11.9) 1 each 5  . diphenoxylate-atropine (LOMOTIL) 2.5-0.025 MG tablet TAKE 1 TABLET BY MOUTH FOUR TIMES DAILY AS NEEDED FOR DIARRHEA OR LOOSE STOOLS 30 tablet 3  . empagliflozin (JARDIANCE) 25 MG TABS tablet Take 25 mg by mouth daily. 30 tablet 5  . glipiZIDE (GLUCOTROL) 5 MG tablet TAKE 1/2 TABLET BY MOUTH TWICE DAILY BEFORE A MEAL 90 tablet 1  . Insulin Glargine (LANTUS SOLOSTAR) 100 UNIT/ML Solostar Pen INJECT UP TO 30 UNITS INTO SKIN EVERY NIGHT AT BEDTIME AS DIRECTED BY DOCTOR 30 mL 3  . Insulin Pen  Needle (B-D ULTRAFINE III SHORT PEN) 31G X 8 MM MISC Use once daily 300 each 0  . lisinopril (PRINIVIL,ZESTRIL) 2.5 MG tablet Take 1 tablet (2.5 mg total) by mouth daily. 30 tablet 5  . lovastatin (MEVACOR) 20 MG tablet TAKE 1 TABLET(20 MG) BY MOUTH AT BEDTIME 90 tablet 1  . metoprolol succinate (TOPROL-XL) 25 MG 24 hr tablet Take 1 tablet (25 mg total) by mouth daily. 30 tablet 5  . omeprazole (PRILOSEC) 20 MG capsule TAKE 1 CAPSULE(20 MG) BY MOUTH DAILY 90 capsule 1  . triamcinolone cream (KENALOG) 0.1 % Apply 1 application topically 2 (two) times daily as needed. 45 g 4   No current facility-administered medications for this visit.     Allergies:   Cortisone; Tetanus toxoids; and  Hydrocodone-homatropine    Social History:  The patient  reports that he has never smoked. He has never used smokeless tobacco. He reports that he does not drink alcohol or use drugs.   Family History:  The patient's family history includes Atrial fibrillation in his sister; Depression in his maternal grandfather; Diabetes in his other and sister; Heart disease in his other and sister.    ROS: All other systems are reviewed and negative. Unless otherwise mentioned in H&P    PHYSICAL EXAM: VS:  BP 132/68   Pulse 68   Ht 5' 10"  (1.778 m)   Wt 182 lb (82.6 kg)   BMI 26.11 kg/m  , BMI Body mass index is 26.11 kg/m. GEN: Well nourished, well developed, in no acute distress  HEENT: normal  Neck: no JVD, carotid bruits, or masses Cardiac: RRR; 3/6 holosystolic murmurs, radiation to the carotids bilaterally and in the abdomen, rubs, or gallops,no edema  Respiratory:  clear to auscultation bilaterally, normal work of breathing GI: soft, nontender, nondistended, + BS MS: no deformity or atrophy  Skin: warm and dry, no rash Neuro:  Strength and sensation are intact Psych: euthymic mood, full affect   EKG: NSR with mild LVH. HR of 68 bpm.  Recent Labs: 08/31/2017: ALT 14; BUN 21; Creatinine, Ser 1.29; Potassium 4.3; Sodium 146    Lipid Panel    Component Value Date/Time   CHOL 137 08/31/2017 0806   TRIG 68 08/31/2017 0806   HDL 43 08/31/2017 0806   CHOLHDL 3.2 08/31/2017 0806   CHOLHDL 2.5 02/20/2016 0803   VLDL 11 02/20/2016 0803   LDLCALC 80 08/31/2017 0806      Wt Readings from Last 3 Encounters:  11/12/17 182 lb (82.6 kg)  09/09/17 183 lb (83 kg)  01/22/17 185 lb 0.4 oz (83.9 kg)      Other studies Reviewed:  Echo 01/04/16: Study Conclusions  - Left ventricle: The cavity size was normal. There was mild concentric hypertrophy. Systolic function was vigorous. The estimated ejection fraction was in the range of 65% to 70%. Wall motion was normal; there were no  regional wall motion abnormalities. Features are consistent with a pseudonormal left ventricular filling pattern, with concomitant abnormal relaxation and increased filling pressure (grade 2 diastolic dysfunction). - Aortic valve: Valve mobility was restricted. There was moderate stenosis. There was moderate regurgitation. Peak velocity (S): 327 cm/s. Mean gradient (S): 23 mm Hg. Valve area (Vmean): 1.42 cm^2. - Mitral valve: Moderately calcified annulus. Mildly thickened leaflets . There was mild regurgitation. - Left atrium: The atrium was mildly dilated  Echocardiogram 11/10/2017 Left ventricle: The cavity size was normal. Wall thickness was   normal. Systolic function was vigorous. The estimated ejection  fraction was in the range of 65% to 70%. Wall motion was normal;   there were no regional wall motion abnormalities. Doppler   parameters are consistent with abnormal left ventricular   relaxation (grade 1 diastolic dysfunction). - Aortic valve: Noncoronary cusp mobility was severely restricted.   Transvalvular velocity was increased more than expected, due to   high cardiac output. There was moderate to severe stenosis. There   was moderate regurgitation directed centrally in the LVOT. Mean   gradient (S): 42 mm Hg. Peak gradient (S): 75 mm Hg. - Mitral valve: Calcified annulus. There was mild to moderate   regurgitation directed centrally. - Left atrium: The atrium was mildly dilated. - Atrial septum: No defect or patent foramen ovale was identified. - Pulmonary arteries: Systolic pressure was mildly increased. PA   peak pressure: 40 mm Hg (S).  Impressions:  - Aortic stenosis has worsened since 2017.   ASSESSMENT AND PLAN:  1.  Severe aortic valve stenosis: Worsening from prior echocardiogram in 2017 with mean gradient increased to 42 mm Hg;high peak gradient 75 mm Hg. Non-coronary cusp mobility was severely restricted.   I have discussed this with Dr.  Ellyn Hack, who is on-site as DOD today.  I would like to go ahead and plan cardiac catheterization in anticipation of need for TAVR.  It is his recommendation that both right and left heart catheterization be completed by either Dr. Angelena Form or Dr. Burt Knack as they will be following him concerning aortic valve.  I have spoken with the patient about this who verbalizes understanding.  Cardiac catheterization has been scheduled with Dr. Lauree Chandler on 11/25/2017 at 8 AM. The patient understands that risks include but are not limited to stroke (1 in 1000), death (1 in 1000), kidney failure [usually temporary] (1 in 500), bleeding (1 in 200), allergic reaction [possibly serious] (1 in 200), and agrees to proceed.   The patient will take half dose of Lantus insulin night before cath.  No other medications will be held or manipulated.  He has verbalized understanding of these instructions.  2.  Hypertension: Excellent control of blood pressure at time of this office visit.  He will continue lisinopril 2.5 mg daily and metoprolol 25 mg daily.  3.  Hypercholesterolemia: He will continue statin therapy as directed, currently on lovastatin 20 mg at bedtime.   Current medicines are reviewed at length with the patient today.    Labs/ tests ordered today include: Cardiac catheterization both right and left heart. [place]. [place] Pugh, ANP, AACC   11/12/2017 11:54 AM    [facility] [address] [place], [place] [address] Phone: [phone]; Fax: [phone]

## 2017-11-12 NOTE — H&P (View-Only) (Signed)
Cardiology Office Note   Date:  11/12/2017   ID:  SADARIUS NORMAN, DOB 1940-11-14, MRN [id number]  PCP:  [name], MD  Cardiologist:  Dr. Oval Linsey   Chief Complaint  Patient presents with  . Follow-up  . Chest Pain  . Shortness of Breath  . Aortic Stenosis    Abnormal echo     History of Present Illness: Jesse Cordova is a 77 y.o. male who presents for ongoing assessment and management of moderate aortic stenosis, diabetes, hypertension, hyperlipidemia,with other history to include GERD and Merkel cell carcinoma of the face. He has undergone radiation at [facility] for treatment.   He comes today at the request of Dr Oval Linsey for follow up due to echocardiogram completed on 10/31/2017 revealing worsening AoV stenosis to the severe range. He will need referral to the TAVR clinic. He has had complaints of chest pain, DOE and some dizziness but does not allow this to stop him. His niece  Genevieve Norlander, works at [employer] street office in [place].  He will need a cardiac cath prior to TAVR evaluation.    Past Medical History:  Diagnosis Date  . Adrenal nodule ([place]) 11/10/2011  . Arthritis   . Cancer ([place])    skin cancer  . Chronic cough   . Chronic low back pain   . Complication of anesthesia   . Diabetes mellitus   . GERD (gastroesophageal reflux disease)   . Hyperlipemia   . Hypertension   . Moderate aortic regurgitation 04/18/2016  . Pneumonia   . PONV (postoperative nausea and vomiting)     Past Surgical History:  Procedure Laterality Date  . BIOPSY  01/16/2012   Procedure: BIOPSY;  Surgeon: Rogene Houston, MD;  Location: [facility] ENDO SU[facility];  Service: Endoscopy;  Laterality: N/A;  . CATARACT EXTRACTION Right 7/06  . CATARACT EXTRACTION W/PHACO Left 01/05/2014   Procedure: CATARACT EXTRACTION PHACO AND INTRAOCULAR LENS PLACEMENT (IOC);  Surgeon: Tonny Branch, MD;  Location: [facility] ORS;  Service: Ophthalmology;  Laterality: Left;  CDE:15.98  .  COLONOSCOPY  01/29/12   abnormal repeat in one year  . ESOPHAGOGASTRODUODENOSCOPY    . KNEE ARTHROSCOPY     2 right knee surgeries and 1 left knee surgery  . TONSILLECTOMY    . VASECTOMY       Current Outpatient Medications  Medication Sig Dispense Refill  . ACCU-CHEK AVIVA PLUS test strip USE TO TEST SUGAR TWICE DAILY 150 each 1  . ACCU-CHEK AVIVA PLUS test strip USE TO TEST SUGAR TWICE DAILY 100 each 5  . aspirin EC 81 MG tablet Take 81 mg by mouth daily.    . benzonatate (TESSALON) 200 MG capsule One three times daily as needed 30 capsule 12  . blood glucose meter kit and supplies KIT Dispense based on patient and insurance preference. Use to test glucose twice daily. (For ICD 10 code E11.9) 1 each 5  . diphenoxylate-atropine (LOMOTIL) 2.5-0.025 MG tablet TAKE 1 TABLET BY MOUTH FOUR TIMES DAILY AS NEEDED FOR DIARRHEA OR LOOSE STOOLS 30 tablet 3  . empagliflozin (JARDIANCE) 25 MG TABS tablet Take 25 mg by mouth daily. 30 tablet 5  . glipiZIDE (GLUCOTROL) 5 MG tablet TAKE 1/2 TABLET BY MOUTH TWICE DAILY BEFORE A MEAL 90 tablet 1  . Insulin Glargine (LANTUS SOLOSTAR) 100 UNIT/ML Solostar Pen INJECT UP TO 30 UNITS INTO SKIN EVERY NIGHT AT BEDTIME AS DIRECTED BY DOCTOR 30 mL 3  . Insulin Pen  Needle (B-D ULTRAFINE III SHORT PEN) 31G X 8 MM MISC Use once daily 300 each 0  . lisinopril (PRINIVIL,ZESTRIL) 2.5 MG tablet Take 1 tablet (2.5 mg total) by mouth daily. 30 tablet 5  . lovastatin (MEVACOR) 20 MG tablet TAKE 1 TABLET(20 MG) BY MOUTH AT BEDTIME 90 tablet 1  . metoprolol succinate (TOPROL-XL) 25 MG 24 hr tablet Take 1 tablet (25 mg total) by mouth daily. 30 tablet 5  . omeprazole (PRILOSEC) 20 MG capsule TAKE 1 CAPSULE(20 MG) BY MOUTH DAILY 90 capsule 1  . triamcinolone cream (KENALOG) 0.1 % Apply 1 application topically 2 (two) times daily as needed. 45 g 4   No current facility-administered medications for this visit.     Allergies:   Cortisone; Tetanus toxoids; and  Hydrocodone-homatropine    Social History:  The patient  reports that he has never smoked. He has never used smokeless tobacco. He reports that he does not drink alcohol or use drugs.   Family History:  The patient's family history includes Atrial fibrillation in his sister; Depression in his maternal grandfather; Diabetes in his other and sister; Heart disease in his other and sister.    ROS: All other systems are reviewed and negative. Unless otherwise mentioned in H&P    PHYSICAL EXAM: VS:  BP 132/68   Pulse 68   Ht 5' 10"  (1.778 m)   Wt 182 lb (82.6 kg)   BMI 26.11 kg/m  , BMI Body mass index is 26.11 kg/m. GEN: Well nourished, well developed, in no acute distress  HEENT: normal  Neck: no JVD, carotid bruits, or masses Cardiac: RRR; 3/6 holosystolic murmurs, radiation to the carotids bilaterally and in the abdomen, rubs, or gallops,no edema  Respiratory:  clear to auscultation bilaterally, normal work of breathing GI: soft, nontender, nondistended, + BS MS: no deformity or atrophy  Skin: warm and dry, no rash Neuro:  Strength and sensation are intact Psych: euthymic mood, full affect   EKG: NSR with mild LVH. HR of 68 bpm.  Recent Labs: 08/31/2017: ALT 14; BUN 21; Creatinine, Ser 1.29; Potassium 4.3; Sodium 146    Lipid Panel    Component Value Date/Time   CHOL 137 08/31/2017 0806   TRIG 68 08/31/2017 0806   HDL 43 08/31/2017 0806   CHOLHDL 3.2 08/31/2017 0806   CHOLHDL 2.5 02/20/2016 0803   VLDL 11 02/20/2016 0803   LDLCALC 80 08/31/2017 0806      Wt Readings from Last 3 Encounters:  11/12/17 182 lb (82.6 kg)  09/09/17 183 lb (83 kg)  01/22/17 185 lb 0.4 oz (83.9 kg)      Other studies Reviewed:  Echo 01/04/16: Study Conclusions  - Left ventricle: The cavity size was normal. There was mild concentric hypertrophy. Systolic function was vigorous. The estimated ejection fraction was in the range of 65% to 70%. Wall motion was normal; there were no  regional wall motion abnormalities. Features are consistent with a pseudonormal left ventricular filling pattern, with concomitant abnormal relaxation and increased filling pressure (grade 2 diastolic dysfunction). - Aortic valve: Valve mobility was restricted. There was moderate stenosis. There was moderate regurgitation. Peak velocity (S): 327 cm/s. Mean gradient (S): 23 mm Hg. Valve area (Vmean): 1.42 cm^2. - Mitral valve: Moderately calcified annulus. Mildly thickened leaflets . There was mild regurgitation. - Left atrium: The atrium was mildly dilated  Echocardiogram 11/10/2017 Left ventricle: The cavity size was normal. Wall thickness was   normal. Systolic function was vigorous. The estimated ejection  fraction was in the range of 65% to 70%. Wall motion was normal;   there were no regional wall motion abnormalities. Doppler   parameters are consistent with abnormal left ventricular   relaxation (grade 1 diastolic dysfunction). - Aortic valve: Noncoronary cusp mobility was severely restricted.   Transvalvular velocity was increased more than expected, due to   high cardiac output. There was moderate to severe stenosis. There   was moderate regurgitation directed centrally in the LVOT. Mean   gradient (S): 42 mm Hg. Peak gradient (S): 75 mm Hg. - Mitral valve: Calcified annulus. There was mild to moderate   regurgitation directed centrally. - Left atrium: The atrium was mildly dilated. - Atrial septum: No defect or patent foramen ovale was identified. - Pulmonary arteries: Systolic pressure was mildly increased. PA   peak pressure: 40 mm Hg (S).  Impressions:  - Aortic stenosis has worsened since 2017.   ASSESSMENT AND PLAN:  1.  Severe aortic valve stenosis: Worsening from prior echocardiogram in 2017 with mean gradient increased to 42 mm Hg;high peak gradient 75 mm Hg. Non-coronary cusp mobility was severely restricted.   I have discussed this with Dr.  Ellyn Hack, who is on-site as DOD today.  I would like to go ahead and plan cardiac catheterization in anticipation of need for TAVR.  It is his recommendation that both right and left heart catheterization be completed by either Dr. Angelena Form or Dr. Burt Knack as they will be following him concerning aortic valve.  I have spoken with the patient about this who verbalizes understanding.  Cardiac catheterization has been scheduled with Dr. Lauree Chandler on 11/25/2017 at 8 AM. The patient understands that risks include but are not limited to stroke (1 in 1000), death (1 in 1000), kidney failure [usually temporary] (1 in 500), bleeding (1 in 200), allergic reaction [possibly serious] (1 in 200), and agrees to proceed.   The patient will take half dose of Lantus insulin night before cath.  No other medications will be held or manipulated.  He has verbalized understanding of these instructions.  2.  Hypertension: Excellent control of blood pressure at time of this office visit.  He will continue lisinopril 2.5 mg daily and metoprolol 25 mg daily.  3.  Hypercholesterolemia: He will continue statin therapy as directed, currently on lovastatin 20 mg at bedtime.   Current medicines are reviewed at length with the patient today.    Labs/ tests ordered today include: Cardiac catheterization both right and left heart. [place]. [place] Pugh, ANP, AACC   11/12/2017 11:54 AM    [facility] [address] [place], [place] [address] Phone: [phone]; Fax: [phone]

## 2017-11-12 NOTE — Patient Instructions (Addendum)
Refer to Dr. Burt Knack or Dr. Julianne Handler for valve evaluation     [place] [address] [place] [address] Dept: [phone] Loc: [place]  11/12/2017  You are scheduled for a Cardiac Catheterization on Wednesday, June 26 with Dr. Lauree Chandler.  1. Please arrive at the [facility] (Main Entrance A) at [facility]: [address] [place], [place] [address] at 6:30 AM (two hours before your procedure to ensure your preparation). Free valet parking service is available.   Special note: Every effort is made to have your procedure done on time. Please understand that emergencies sometimes delay scheduled procedures.  2. Diet: Do not eat or drink anything after midnight prior to your procedure except sips of water to take medications.  3. Labs: You will need to have blood drawn on Thursday, June 13 at [place], [place]  Open: [place] (Lunch 12:30 - 1:30)   Phone: [phone]. You do not need to be fasting.  4. Medication instructions in preparation for your procedure:  Take only 10 units of insulin the night before your procedure. Do not take any insulin on the day of the procedure.  On the morning of your procedure, take your Aspirin and any morning medicines NOT listed above.  You may use sips of water.  5. Plan for one night stay--bring personal belongings. 6. Bring a current list of your medications and current insurance cards. 7. You MUST have a responsible person to drive you home. 8. Someone MUST be with you the first 24 hours after you arrive home or your discharge will be delayed. 9. Please wear clothes that are easy to get on and off and wear slip-on shoes.  Thank you for allowing [place] to care for you!   --  Invasive Cardiovascular services

## 2017-11-23 DIAGNOSIS — Z0289 Encounter for other administrative examinations: Secondary | ICD-10-CM

## 2017-11-24 ENCOUNTER — Telehealth: Payer: Self-pay | Admitting: *Deleted

## 2017-11-24 NOTE — Telephone Encounter (Signed)
Pt contacted pre-catheterization scheduled at [facility] for: Wednesday November 25, 2017 8:30 AM Verified arrival time and place: [place] Entrance A at: 6:30 AM  No solid food after midnight prior to cath, clear liquids until 5 AM day of procedure. Verified allergies in Epic  Hold: Jardiance AM of procedure Glipizide AM of procedure Insulin AM of procedure 1/2 Insulin PM prior to procedure  Except hold medications AM meds can be  taken pre-cath with sip of water including: ASA 81 mg  Confirmed patient has responsible person to drive home post procedure and observe patient for 24 hours: yes  Discussed with pt's granddaughter, Erline Levine

## 2017-11-25 ENCOUNTER — Ambulatory Visit (HOSPITAL_COMMUNITY)
Admission: RE | Admit: 2017-11-25 | Discharge: 2017-11-25 | Disposition: A | Discharge status: Home / Self Care | Payer: Medicare Other | Source: Ambulatory Visit | Attending: Cardiovascular Disease | Admitting: Cardiovascular Disease

## 2017-11-25 ENCOUNTER — Encounter (HOSPITAL_COMMUNITY)
Admission: RE | Disposition: A | Discharge status: Home / Self Care | Payer: Self-pay | Source: Ambulatory Visit | Attending: Cardiovascular Disease

## 2017-11-25 ENCOUNTER — Ambulatory Visit: Payer: Medicare Other | Admitting: Cardiovascular Disease

## 2017-11-25 ENCOUNTER — Other Ambulatory Visit: Payer: Self-pay

## 2017-11-25 DIAGNOSIS — Z8249 Family history of ischemic heart disease and other diseases of the circulatory system: Secondary | ICD-10-CM | POA: Diagnosis not present

## 2017-11-25 DIAGNOSIS — Z7982 Long term (current) use of aspirin: Secondary | ICD-10-CM | POA: Diagnosis not present

## 2017-11-25 DIAGNOSIS — Z888 Allergy status to other drugs, medicaments and biological substances status: Secondary | ICD-10-CM | POA: Insufficient documentation

## 2017-11-25 DIAGNOSIS — I350 Nonrheumatic aortic (valve) stenosis: Secondary | ICD-10-CM | POA: Insufficient documentation

## 2017-11-25 DIAGNOSIS — Z9889 Other specified postprocedural states: Secondary | ICD-10-CM | POA: Diagnosis not present

## 2017-11-25 DIAGNOSIS — M1990 Unspecified osteoarthritis, unspecified site: Secondary | ICD-10-CM | POA: Insufficient documentation

## 2017-11-25 DIAGNOSIS — K219 Gastro-esophageal reflux disease without esophagitis: Secondary | ICD-10-CM | POA: Diagnosis not present

## 2017-11-25 DIAGNOSIS — I2510 Atherosclerotic heart disease of native coronary artery without angina pectoris: Secondary | ICD-10-CM | POA: Diagnosis not present

## 2017-11-25 DIAGNOSIS — Z885 Allergy status to narcotic agent status: Secondary | ICD-10-CM | POA: Insufficient documentation

## 2017-11-25 DIAGNOSIS — R42 Dizziness and giddiness: Secondary | ICD-10-CM

## 2017-11-25 DIAGNOSIS — Z923 Personal history of irradiation: Secondary | ICD-10-CM | POA: Diagnosis not present

## 2017-11-25 DIAGNOSIS — E7800 Pure hypercholesterolemia, unspecified: Secondary | ICD-10-CM | POA: Diagnosis not present

## 2017-11-25 DIAGNOSIS — Z833 Family history of diabetes mellitus: Secondary | ICD-10-CM | POA: Diagnosis not present

## 2017-11-25 DIAGNOSIS — E119 Type 2 diabetes mellitus without complications: Secondary | ICD-10-CM | POA: Diagnosis not present

## 2017-11-25 DIAGNOSIS — Z794 Long term (current) use of insulin: Secondary | ICD-10-CM | POA: Insufficient documentation

## 2017-11-25 DIAGNOSIS — Z79899 Other long term (current) drug therapy: Secondary | ICD-10-CM | POA: Diagnosis not present

## 2017-11-25 DIAGNOSIS — Z9841 Cataract extraction status, right eye: Secondary | ICD-10-CM | POA: Diagnosis not present

## 2017-11-25 DIAGNOSIS — Z9852 Vasectomy status: Secondary | ICD-10-CM | POA: Diagnosis not present

## 2017-11-25 DIAGNOSIS — I10 Essential (primary) hypertension: Secondary | ICD-10-CM | POA: Diagnosis not present

## 2017-11-25 DIAGNOSIS — Z887 Allergy status to serum and vaccine status: Secondary | ICD-10-CM | POA: Insufficient documentation

## 2017-11-25 DIAGNOSIS — Z85828 Personal history of other malignant neoplasm of skin: Secondary | ICD-10-CM | POA: Diagnosis not present

## 2017-11-25 DIAGNOSIS — Z9842 Cataract extraction status, left eye: Secondary | ICD-10-CM | POA: Diagnosis not present

## 2017-11-25 DIAGNOSIS — R0602 Shortness of breath: Secondary | ICD-10-CM | POA: Diagnosis not present

## 2017-11-25 DIAGNOSIS — R0609 Other forms of dyspnea: Secondary | ICD-10-CM

## 2017-11-25 HISTORY — PX: RIGHT/LEFT HEART CATH AND CORONARY ANGIOGRAPHY: CATH118266

## 2017-11-25 LAB — BASIC METABOLIC PANEL
Anion gap: 11 (ref 5–15)
BUN: 20 mg/dL (ref 8–23)
CALCIUM: 8.8 mg/dL — AB (ref 8.9–10.3)
CHLORIDE: 109 mmol/L (ref 98–111)
CO2: 20 mmol/L — ABNORMAL LOW (ref 22–32)
CREATININE: 1.28 mg/dL — AB (ref 0.61–1.24)
GFR calc non Af Amer: 52 mL/min — ABNORMAL LOW (ref >60)
Glucose, Bld: 119 mg/dL — ABNORMAL HIGH (ref 70–99)
Potassium: 3.9 mmol/L (ref 3.5–5.1)
Sodium: 140 mmol/L (ref 135–145)

## 2017-11-25 LAB — POCT I-STAT 3, VENOUS BLOOD GAS (G3P V)
Acid-base deficit: 8 mmol/L — ABNORMAL HIGH (ref 0.0–2.0)
Bicarbonate: 17.3 mmol/L — ABNORMAL LOW (ref 20.0–28.0)
O2 Saturation: 75 %
TCO2: 18 mmol/L — ABNORMAL LOW (ref 22–32)
pCO2, Ven: 32.5 mmHg — ABNORMAL LOW (ref 44.0–60.0)
pH, Ven: 7.334 (ref 7.250–7.430)
pO2, Ven: 42 mmHg (ref 32.0–45.0)

## 2017-11-25 LAB — POCT I-STAT 3, ART BLOOD GAS (G3+)
ACID-BASE DEFICIT: 11 mmol/L — AB (ref 0.0–2.0)
Bicarbonate: 14 mmol/L — ABNORMAL LOW (ref 20.0–28.0)
O2 SAT: 99 %
PCO2 ART: 25.7 mmHg — AB (ref 32.0–48.0)
PO2 ART: 136 mmHg — AB (ref 83.0–108.0)
TCO2: 15 mmol/L — ABNORMAL LOW (ref 22–32)
pH, Arterial: 7.344 — ABNORMAL LOW (ref 7.350–7.450)

## 2017-11-25 LAB — GLUCOSE, CAPILLARY
Glucose-Capillary: 120 mg/dL — ABNORMAL HIGH (ref 70–99)
Glucose-Capillary: 100 mg/dL — ABNORMAL HIGH (ref 70–99)

## 2017-11-25 SURGERY — RIGHT/LEFT HEART CATH AND CORONARY ANGIOGRAPHY
Anesthesia: LOCAL

## 2017-11-25 MED ORDER — LIDOCAINE HCL (PF) 1 % IJ SOLN
INTRAMUSCULAR | Status: DC | PRN
  Administered 2017-11-25: 2 mL

## 2017-11-25 MED ORDER — IOHEXOL 350 MG/ML SOLN
INTRAVENOUS | Status: DC | PRN
  Administered 2017-11-25: 55 mL via INTRACARDIAC

## 2017-11-25 MED ORDER — HEPARIN SODIUM (PORCINE) 1000 UNIT/ML IJ SOLN
INTRAMUSCULAR | Status: DC | PRN
  Administered 2017-11-25: 4000 [IU] via INTRAVENOUS

## 2017-11-25 MED ORDER — HEPARIN (PORCINE) IN NACL 2-0.9 UNITS/ML
INTRAMUSCULAR | Status: AC | PRN
  Administered 2017-11-25: 1000 mL

## 2017-11-25 MED ORDER — FENTANYL CITRATE (PF) 100 MCG/2ML IJ SOLN
INTRAMUSCULAR | Status: AC
  Filled 2017-11-25: qty 2

## 2017-11-25 MED ORDER — VERAPAMIL HCL 2.5 MG/ML IV SOLN
INTRAVENOUS | Status: DC | PRN
  Administered 2017-11-25: 10 mL via INTRA_ARTERIAL

## 2017-11-25 MED ORDER — SODIUM CHLORIDE 0.9 % IV SOLN: INTRAVENOUS | Status: AC

## 2017-11-25 MED ORDER — SODIUM CHLORIDE 0.9% FLUSH: 3.0000 mL | Freq: Two times a day (BID) | INTRAVENOUS | Status: DC

## 2017-11-25 MED ORDER — FENTANYL CITRATE (PF) 100 MCG/2ML IJ SOLN
INTRAMUSCULAR | Status: DC | PRN
  Administered 2017-11-25: 25 ug via INTRAVENOUS

## 2017-11-25 MED ORDER — SODIUM CHLORIDE 0.9 % IV SOLN: 250.0000 mL | INTRAVENOUS | Status: DC | PRN

## 2017-11-25 MED ORDER — MIDAZOLAM HCL 2 MG/2ML IJ SOLN
INTRAMUSCULAR | Status: DC | PRN
  Administered 2017-11-25: 1 mg via INTRAVENOUS

## 2017-11-25 MED ORDER — ASPIRIN 81 MG PO CHEW: 81.0000 mg | CHEWABLE_TABLET | ORAL | Status: DC

## 2017-11-25 MED ORDER — SODIUM CHLORIDE 0.9 % WEIGHT BASED INFUSION
3.0000 mL/kg/h | INTRAVENOUS | Status: AC
  Administered 2017-11-25: 3 mL/kg/h via INTRAVENOUS

## 2017-11-25 MED ORDER — HEPARIN (PORCINE) IN NACL 1000-0.9 UT/500ML-% IV SOLN
INTRAVENOUS | Status: AC
  Filled 2017-11-25: qty 1000

## 2017-11-25 MED ORDER — VERAPAMIL HCL 2.5 MG/ML IV SOLN
INTRAVENOUS | Status: AC
  Filled 2017-11-25: qty 2

## 2017-11-25 MED ORDER — MIDAZOLAM HCL 2 MG/2ML IJ SOLN
INTRAMUSCULAR | Status: AC
  Filled 2017-11-25: qty 2

## 2017-11-25 MED ORDER — ACETAMINOPHEN 325 MG PO TABS: 650.0000 mg | ORAL_TABLET | ORAL | Status: DC | PRN

## 2017-11-25 MED ORDER — SODIUM CHLORIDE 0.9 % WEIGHT BASED INFUSION: 1.0000 mL/kg/h | INTRAVENOUS | Status: DC

## 2017-11-25 MED ORDER — SODIUM CHLORIDE 0.9% FLUSH: 3.0000 mL | INTRAVENOUS | Status: DC | PRN

## 2017-11-25 MED ORDER — SODIUM CHLORIDE 0.9% FLUSH
3.0000 mL | INTRAVENOUS | Status: DC | PRN
Start: 2017-11-25 — End: 2017-11-25

## 2017-11-25 MED ORDER — ONDANSETRON HCL 4 MG/2ML IJ SOLN: 4.0000 mg | Freq: Four times a day (QID) | INTRAMUSCULAR | Status: DC | PRN

## 2017-11-25 MED ORDER — LIDOCAINE HCL (PF) 1 % IJ SOLN
INTRAMUSCULAR | Status: AC
  Filled 2017-11-25: qty 30

## 2017-11-25 MED ORDER — HEPARIN SODIUM (PORCINE) 1000 UNIT/ML IJ SOLN
INTRAMUSCULAR | Status: AC
  Filled 2017-11-25: qty 1

## 2017-11-25 SURGICAL SUPPLY — 13 items
CATH 5FR JL3.5 JR4 ANG PIG MP (CATHETERS) ×1 IMPLANT
CATH BALLN WEDGE 5F 110CM (CATHETERS) ×1 IMPLANT
CATH INFINITI 5FR AL1 (CATHETERS) ×1 IMPLANT
DEVICE RAD COMP TR BAND LRG (VASCULAR PRODUCTS) ×1 IMPLANT
GLIDESHEATH SLEND SS 6F .021 (SHEATH) ×1 IMPLANT
GUIDEWIRE INQWIRE 1.5J.035X260 (WIRE) IMPLANT
INQWIRE 1.5J .035X260CM (WIRE) ×2
KIT HEART LEFT (KITS) ×2 IMPLANT
PACK CARDIAC CATHETERIZATION (CUSTOM PROCEDURE TRAY) ×2 IMPLANT
SHEATH GLIDE SLENDER 4/5FR (SHEATH) ×1 IMPLANT
TRANSDUCER W/STOPCOCK (MISCELLANEOUS) ×2 IMPLANT
TUBING CIL FLEX 10 FLL-RA (TUBING) ×2 IMPLANT
WIRE EMERALD ST .035X260CM (WIRE) ×1 IMPLANT

## 2017-11-25 NOTE — Research (Signed)
CADFEM Informed Consent   Subject Name: Jesse Cordova  Subject met inclusion and exclusion criteria.  The informed consent form, study requirements and expectations were reviewed with the subject and questions and concerns were addressed prior to the signing of the consent form.  The subject verbalized understanding of the trail requirements.  The subject agreed to participate in the CADFEM trial and signed the informed consent.  The informed consent was obtained prior to performance of any protocol-specific procedures for the subject.  A copy of the signed informed consent was given to the subject and a copy was placed in the subject's medical record.  [name] 11/25/2017, 07:37 AM

## 2017-11-25 NOTE — Interval H&P Note (Signed)
History and Physical Interval Note:  11/25/2017 8:00 AM  Jesse Cordova  has presented today for cardiac cath with the diagnosis of severe AS. The various methods of treatment have been discussed with the patient and family. After consideration of risks, benefits and other options for treatment, the patient has consented to  Procedure(s): RIGHT/LEFT HEART CATH AND CORONARY ANGIOGRAPHY (N/A) as a surgical intervention .  The patient's history has been reviewed, patient examined, no change in status, stable for surgery.  I have reviewed the patient's chart and labs.  Questions were answered to the patient's satisfaction.    Cath Lab Visit (complete for each Cath Lab visit)  Clinical Evaluation Leading to the Procedure:   ACS: No.  Non-ACS:    Anginal Classification: CCS I  Anti-ischemic medical therapy: Minimal Therapy (1 class of medications)  Non-Invasive Test Results: No non-invasive testing performed  Prior CABG: No previous CABG         Lauree Chandler

## 2017-11-25 NOTE — Discharge Instructions (Signed)

## 2017-11-26 ENCOUNTER — Telehealth: Payer: Self-pay | Admitting: Family Medicine

## 2017-11-26 ENCOUNTER — Encounter (HOSPITAL_COMMUNITY): Payer: Self-pay | Admitting: Cardiovascular Disease

## 2017-11-26 NOTE — Telephone Encounter (Signed)
This was completed as best as we can-please review

## 2017-11-26 NOTE — Telephone Encounter (Signed)
Patient daughter dropped off FMLA to be filled out . I filled in what I knew for his notes .Please review form and fill in highlighted areas,date and sign.Form is in your yellow folder.

## 2017-12-08 ENCOUNTER — Encounter (HOSPITAL_COMMUNITY): Payer: Self-pay

## 2017-12-08 ENCOUNTER — Ambulatory Visit (HOSPITAL_BASED_OUTPATIENT_CLINIC_OR_DEPARTMENT_OTHER)
Admit: 2017-12-08 | Discharge: 2017-12-08 | Disposition: A | Discharge status: Home / Self Care | Payer: Medicare Other | Attending: Cardiovascular Disease | Admitting: Cardiovascular Disease

## 2017-12-08 ENCOUNTER — Ambulatory Visit (HOSPITAL_COMMUNITY)
Admission: RE | Admit: 2017-12-08 | Discharge: 2017-12-08 | Disposition: A | Discharge status: Home / Self Care | Payer: Medicare Other | Source: Ambulatory Visit | Attending: Cardiovascular Disease | Admitting: Cardiovascular Disease

## 2017-12-08 ENCOUNTER — Ambulatory Visit (HOSPITAL_COMMUNITY)
Admit: 2017-12-08 | Discharge: 2017-12-08 | Disposition: A | Discharge status: Home / Self Care | Payer: Medicare Other | Attending: Cardiovascular Disease | Admitting: Cardiovascular Disease

## 2017-12-08 ENCOUNTER — Ambulatory Visit (HOSPITAL_COMMUNITY): Payer: Medicare Other

## 2017-12-08 DIAGNOSIS — I350 Nonrheumatic aortic (valve) stenosis: Secondary | ICD-10-CM | POA: Diagnosis not present

## 2017-12-08 DIAGNOSIS — I2510 Atherosclerotic heart disease of native coronary artery without angina pectoris: Secondary | ICD-10-CM | POA: Insufficient documentation

## 2017-12-08 DIAGNOSIS — R0609 Other forms of dyspnea: Secondary | ICD-10-CM | POA: Insufficient documentation

## 2017-12-08 DIAGNOSIS — I700 Atherosclerosis of aorta: Secondary | ICD-10-CM | POA: Insufficient documentation

## 2017-12-08 DIAGNOSIS — R42 Dizziness and giddiness: Secondary | ICD-10-CM | POA: Insufficient documentation

## 2017-12-08 DIAGNOSIS — Z01818 Encounter for other preprocedural examination: Secondary | ICD-10-CM | POA: Diagnosis not present

## 2017-12-08 DIAGNOSIS — I6523 Occlusion and stenosis of bilateral carotid arteries: Secondary | ICD-10-CM | POA: Diagnosis not present

## 2017-12-08 LAB — PULMONARY FUNCTION TEST
DL/VA % pred: 78 %
DL/VA: 3.73 ml/min/mmHg/L
DLCO unc % pred: 54 %
DLCO unc: 19.74 ml/min/mmHg
FEF 25-75 POST: 3.28 L/s
FEF 25-75 Pre: 3.05 L/sec
FEF2575-%CHANGE-POST: 7 %
FEF2575-%PRED-PRE: 129 %
FEF2575-%Pred-Post: 139 %
FEV1-%Change-Post: 3 %
FEV1-%PRED-POST: 82 %
FEV1-%PRED-PRE: 79 %
FEV1-POST: 2.74 L
FEV1-PRE: 2.65 L
FEV1FVC-%CHANGE-POST: 0 %
FEV1FVC-%Pred-Pre: 115 %
FEV6-%CHANGE-POST: 3 %
FEV6-%Pred-Post: 75 %
FEV6-%Pred-Pre: 73 %
FEV6-PRE: 3.18 L
FEV6-Post: 3.27 L
FEV6FVC-%PRED-PRE: 106 %
FEV6FVC-%Pred-Post: 106 %
FVC-%Change-Post: 3 %
FVC-%PRED-PRE: 68 %
FVC-%Pred-Post: 70 %
FVC-POST: 3.27 L
FVC-PRE: 3.18 L
POST FEV1/FVC RATIO: 84 %
PRE FEV1/FVC RATIO: 83 %
Post FEV6/FVC ratio: 100 %
Pre FEV6/FVC Ratio: 100 %

## 2017-12-08 MED ORDER — IOPAMIDOL (ISOVUE-370) INJECTION 76%
INTRAVENOUS | Status: AC
  Administered 2017-12-08: 100 mL
  Filled 2017-12-08: qty 100

## 2017-12-08 MED ORDER — ALBUTEROL SULFATE (2.5 MG/3ML) 0.083% IN NEBU
2.5000 mg | INHALATION_SOLUTION | Freq: Once | RESPIRATORY_TRACT | Status: AC
  Administered 2017-12-08: 2.5 mg via RESPIRATORY_TRACT

## 2017-12-08 NOTE — Progress Notes (Signed)
Preliminary results by tech - Carotid duplex completed. No evidence of stenosis in bilateral carotid arteries. Leopold Smyers, BS, RDMS, RVT  

## 2017-12-10 ENCOUNTER — Institutional Professional Consult (permissible substitution): Payer: Medicare Other | Admitting: Surgery

## 2017-12-10 ENCOUNTER — Ambulatory Visit: Payer: Medicare Other | Attending: Cardiovascular Disease | Admitting: Physical Therapy

## 2017-12-10 ENCOUNTER — Encounter: Payer: Self-pay | Admitting: Surgery

## 2017-12-10 ENCOUNTER — Other Ambulatory Visit: Payer: Self-pay

## 2017-12-10 ENCOUNTER — Encounter: Payer: Self-pay | Admitting: Physical Therapy

## 2017-12-10 VITALS — BP 148/73 | HR 69 | Ht 70.0 in

## 2017-12-10 DIAGNOSIS — I350 Nonrheumatic aortic (valve) stenosis: Secondary | ICD-10-CM | POA: Diagnosis not present

## 2017-12-10 DIAGNOSIS — R2689 Other abnormalities of gait and mobility: Secondary | ICD-10-CM | POA: Diagnosis present

## 2017-12-10 NOTE — Therapy (Signed)
[place], [place], [address] Phone: [phone]   Fax:  [phone]  Physical Therapy Evaluation  Patient Details  Name: Jesse Cordova MRN: [id number] Date of Birth: 1940/07/05 Referring Provider: Dr Jaclyn Shaggy    Encounter Date: 12/10/2017  PT End of Session - 12/10/17 1359    Visit Number  1    Number of Visits  1    Date for PT Re-Evaluation  12/12/17    PT Start Time  7619    PT Stop Time  1315    PT Time Calculation (min)  30 min    Activity Tolerance  Patient tolerated treatment well    Behavior During Therapy  [facility] for tasks assessed/performed       Past Medical History:  Diagnosis Date  . Adrenal nodule ([place]) 11/10/2011  . Arthritis   . Cancer ([place])    skin cancer  . Chronic cough   . Chronic low back pain   . Complication of anesthesia   . Diabetes mellitus   . GERD (gastroesophageal reflux disease)   . Hyperlipemia   . Hypertension   . Moderate aortic regurgitation 04/18/2016  . Pneumonia   . PONV (postoperative nausea and vomiting)     Past Surgical History:  Procedure Laterality Date  . BIOPSY  01/16/2012   Procedure: BIOPSY;  Surgeon: Rogene Houston, MD;  Location[facility];  Service: Endoscopy;  Laterality: N/A;  . CATARACT EXTRACTION Right 7/06  . CATARACT EXTRACTION W/PHACO Left 01/05/2014   Procedure: CATARACT EXTRACTION PHACO AND INTRAOCULAR LENS PLACEMENT (IOC);  Surgeon: Tonny Branch, MD;  Location: [facility];  Service: Ophthalmology;  Laterality: Left;  CDE:15.98  . COLONOSCOPY  01/29/12   abnormal repeat in one year  . ESOPHAGOGASTRODUODENOSCOPY    . KNEE ARTHROSCOPY     2 right knee surgeries and 1 left knee surgery  . RIGHT/LEFT HEART CATH AND CORONARY ANGIOGRAPHY N/A 11/25/2017   Procedure: RIGHT/LEFT HEART CATH AND CORONARY ANGIOGRAPHY;  Surgeon: Burnell Blanks, MD;  Location: [place] [facility];  Service: Cardiovascular;  Laterality: N/A;  .  TONSILLECTOMY    . VASECTOMY      There were no vitals filed for this visit.   Subjective Assessment - 12/10/17 1252    Subjective  Patient reports that over the past few months he has [place] short of breath. He has shortness of breath when going out to get the paper, a distance of about 40'. He has baseline arthritis in his right knee.     Pertinent History  DMII, cancer,     Limitations  Standing;Walking    Currently in Pain?  No/denies has pain in his knee at times          [facility] PT Assessment - 12/10/17 0001      Assessment   Medical Diagnosis  Severe Aortic Stenosis     Referring Provider  Dr Jaclyn Shaggy     Next MD Visit  3 months       Precautions   Precautions  None      Restrictions   Weight Bearing Restrictions  No      Balance Screen   Has the patient fallen in the past 6 months  No    Has the patient had a decrease in activity level because of a fear of falling?   No    Is the patient reluctant to leave their home because of a fear of falling?  No      Home Environment   Additional Comments  10 steps into the back. Becomes short of breath       Prior Function   Level of Independence  Independent    Vocation  Retired    Leisure  walking       Cognition   Overall Cognitive Status  Within Functional Limits for tasks assessed    Attention  Focused    Focused Attention  Appears intact    Memory  Appears intact    Awareness  Appears intact    Problem Solving  Appears intact    Behaviors  Restless      Observation/Other Assessments   Focus on Therapeutic Outcomes (FOTO)   1x visit       Sensation   Light Touch  Appears Intact      Coordination   Gross Motor Movements are Fluid and Coordinated  Yes    Fine Motor Movements are Fluid and Coordinated  Yes      Posture/Postural Control   Posture/Postural Control  No significant limitations      ROM / Strength   AROM / PROM / Strength  AROM;PROM;Strength      AROM   Overall AROM Comments   full ROM pain with end range flexion on the right       Strength   Overall Strength Comments  gross 5/5 strength     Strength Assessment Site  Hand    Right/Left hand  Right;Left    Right Hand Grip (lbs)  60    Left Hand Grip (lbs)  45       [facility] Pre-Surgical Assessment - 12/10/17 0001    5 Meter Walk Test- trial 1  7 sec    5 Meter Walk Test- trial 2  7 sec.     5 Meter Walk Test- trial 3  6 sec.    5 meter walk test average  6.67 sec    4 Stage Balance Test Position  4    Comment  5 20 sec     ADL/IADL Independent with:  Bathing;Dressing;Finances;Meal prep    ADL/IADL Needs Assistance with:  [employer] work    ADL/IADL Fraility Index  Midly frail    6 Minute Walk- Baseline  yes    BP (mmHg)  139/66    HR (bpm)  64    02 Sat (%RA)  97 %    Modified Borg Scale for Dyspnea  0- Nothing at all    Perceived Rate of Exertion (Borg)  6-    6 Minute Walk Post Test  yes    BP (mmHg)  137/71    HR (bpm)  74    02 Sat (%RA)  97 %    Modified Borg Scale for Dyspnea  2- Mild shortness of breath    Perceived Rate of Exertion (Borg)  11- Fairly light    Aerobic Endurance Distance Walked  925              Objective measurements completed on examination: See above findings.                           Plan - 12/10/17 1402    Clinical Impression Statement  See below     History and Personal Factors relevant to plan of care:  DMII; chronic low back pain,     Clinical Presentation  Stable    Clinical Decision Making  Low    Rehab Potential  Good    PT Treatment/Interventions  Patient/family education    PT Next Visit Plan  1x visit       Clinical Impression Statement: Pt is a 77 yo May 12, 1941 presenting to OP PT for evaluation prior to possible TAVR surgery due to severe aortic stenosis. Pt reports onset of shortness of breath approximately 3 months ago. Symptoms are limiting his ability to walk distances > 40 feet and stairs. Pt presents with normal ROM and  strength, good balance and is at a lowfall risk 4 stage balance test, normal walking speed and fair aerobic endurance per 6 minute walk test. Pt ambulated 925 feet in 5:30  before requesting a seated rest beak lasting 30. At time of rest, patient's HR was 76 bpm and O2 was 97 on room air. Pt reported 2/10 shortness of breath on modified scale for dyspnea.  B/P increased significantly with 6 minute walk test. Based on the Short Physical Performance Battery, patient has a frailty rating of 8/12 with </= 5/12 considered frail.    Patient will benefit from skilled therapeutic intervention in order to improve the following deficits and impairments:     Visit Diagnosis: Other abnormalities of gait and mobility     Problem List Patient Active Problem List   Diagnosis Date Noted  . Nonrheumatic aortic valve stenosis   . Pre-op chest exam 11/12/2017  . Diabetic polyneuropathy associated with type 2 diabetes mellitus ([place]) 09/18/2016  . Moderate aortic regurgitation 04/18/2016  . Moderate aortic stenosis 02/18/2016  . 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 Left shoulder pain 10/04/2015  . Essential hypertension, benign 11/15/2012  . Hyperlipidemia 11/15/2012  . Other and unspecified hyperlipidemia 08/23/2012  . CAP (community acquired pneumonia) 11/10/2011  . DM type 2 (diabetes mellitus, type 2) ([place]) 11/10/2011  . Hypokalemia 11/10/2011  . Hyponatremia 11/10/2011  . Adrenal nodule ([place]) 11/10/2011    Carney Living PT DPT  12/10/2017, 2:05 PM  [facility] [address] [place], [place], [address] Phone: [phone]   Fax:  [phone]  Name: Jesse Cordova MRN: [id number] Date of Birth: 02/10/1941

## 2017-12-10 NOTE — Progress Notes (Signed)
[facility] [place] [facility]  CARDIOTHORACIC SURGERY CONSULTATION REPORT  Referring Provider is Skeet Latch, MD PCP is [name], MD  Chief Complaint  Patient presents with  . 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  He had a murmur on exam and an echocardiogram at that time showed moderate aortic stenosis with a mean gradient of 23 mmHg.  There was also moderate aortic insufficiency.  He was asymptomatic at that time he underwent his skin cancer surgery followed by radiation therapy.  He now reports that over the past few months he has developed progressive exertional fatigue and shortness of breath which has been occurring with walking on level ground out to his mailbox which is about 30 feet.  He says that just about any activity now is giving him some shortness of breath and gets his attention.  He denies any chest pain or pressure.  He has had no dizziness or syncope.  He denies orthopnea and PND.  He had an echo on 11/10/2017 that showed progression of his aortic valve stenosis to severe with a mean gradient of 42 mmHg.  There is moderate aortic insufficiency and normal left ventricular systolic function.  The patient is here today with his daughter.  His granddaughter Lanetta Inch works for [employer] in the nuclear medicine department.  He lives at home with his wife who is in poor health.  He provides total care for her.  Past Medical History:  Diagnosis Date  . 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 Arthritis   . Cancer ([place])    skin cancer  . Chronic cough   . Chronic low back pain   . Complication of anesthesia   . Diabetes mellitus   . GERD (gastroesophageal reflux disease)   .  Hyperlipemia   . Hypertension   . Moderate aortic regurgitation 04/18/2016  . Pneumonia   . PONV (postoperative nausea and vomiting)     Past Surgical History:  Procedure Laterality Date  . BIOPSY  01/16/2012   Procedure: BIOPSY;  Surgeon: Rogene Houston, MD;  Location[facility];  Service: Endoscopy;  Laterality: N/A;  . CATARACT EXTRACTION Right 7/06  . CATARACT EXTRACTION W/PHACO Left 01/05/2014   Procedure: CATARACT EXTRACTION PHACO AND INTRAOCULAR LENS PLACEMENT (IOC);  Surgeon: Tonny Branch, MD;  Location: [facility] ORS;  Service: Ophthalmology;  Laterality: Left;  CDE:15.98  . COLONOSCOPY  01/29/12   abnormal repeat in one year  . ESOPHAGOGASTRODUODENOSCOPY    . KNEE ARTHROSCOPY     2 right knee surgeries and 1 left knee surgery  . RIGHT/LEFT HEART CATH AND CORONARY ANGIOGRAPHY N/A 11/25/2017   Procedure: RIGHT/LEFT HEART CATH AND CORONARY ANGIOGRAPHY;  Surgeon: Burnell Blanks, MD;  Location: [place] [facility];  Service: Cardiovascular;  Laterality: N/A;  . TONSILLECTOMY    . VASECTOMY      Family History  Problem Relation Age of Onset  . Diabetes Sister   . Heart disease Sister   . Atrial fibrillation Sister   . Heart disease Other   . Diabetes Other   . Depression Maternal Grandfather     Social History   Socioeconomic History  . Marital status: Married  Spouse name: Not on file  . Number of children: Not on file  . Years of education: Not on file  . Highest education level: Not on file  Occupational History  . Not on file  Social Needs  . Financial resource strain: Not on file  . Food insecurity:    Worry: Not on file    Inability: Not on file  . Transportation needs:    Medical: Not on file    Non-medical: Not on file  Tobacco Use  . Smoking status: Never Smoker  . Smokeless tobacco: Never Used  . Tobacco comment: From spouse. Wife does not smoke around him now  Substance and Sexual Activity  . Alcohol use: No    Alcohol/week: 0.0 oz  . Drug  use: No  . Sexual activity: Not on file  Lifestyle  . Physical activity:    Days per week: Not on file    Minutes per session: Not on file  . Stress: Not on file  Relationships  . Social connections:    Talks on phone: Not on file    Gets together: Not on file    Attends religious service: Not on file    Active member of club or organization: Not on file    Attends meetings of clubs or organizations: Not on file    Relationship status: Not on file  . Intimate partner violence:    Fear of current or ex partner: Not on file    Emotionally abused: Not on file    Physically abused: Not on file    Forced sexual activity: Not on file  Other Topics Concern  . Not on file  Social History Narrative  . Not on file    Current Outpatient Medications  Medication Sig Dispense Refill  . ACCU-CHEK AVIVA PLUS test strip USE TO TEST SUGAR TWICE DAILY 150 each 1  . ACCU-CHEK AVIVA PLUS test strip USE TO TEST SUGAR TWICE DAILY 100 each 5  . aspirin EC 81 MG tablet Take 81 mg by mouth daily.    . benzonatate (TESSALON) 200 MG capsule One three times daily as needed (Patient taking differently: Take 200 mg by mouth 3 (three) times daily as needed for cough. One three times daily as needed) 30 capsule 12  . blood glucose meter kit and supplies KIT Dispense based on patient and insurance preference. Use to test glucose twice daily. (For ICD 10 code E11.9) 1 each 5  . cetirizine (ZYRTEC) 10 MG tablet Take 10 mg by mouth daily.    . diphenoxylate-atropine (LOMOTIL) 2.5-0.025 MG tablet TAKE 1 TABLET BY MOUTH FOUR TIMES DAILY AS NEEDED FOR DIARRHEA OR LOOSE STOOLS 30 tablet 3  . empagliflozin (JARDIANCE) 25 MG TABS tablet Take 25 mg by mouth daily. 30 tablet 5  . glipiZIDE (GLUCOTROL) 5 MG tablet TAKE 1/2 TABLET BY MOUTH TWICE DAILY BEFORE A MEAL 90 tablet 1  . ibuprofen (ADVIL,MOTRIN) 200 MG tablet Take 400 mg by mouth every 8 (eight) hours as needed for mild pain or moderate pain.    . Insulin Glargine  (LANTUS SOLOSTAR) 100 UNIT/ML Solostar Pen INJECT UP TO 30 UNITS INTO SKIN EVERY NIGHT AT BEDTIME AS DIRECTED BY DOCTOR 30 mL 3  . Insulin Pen Needle (B-D ULTRAFINE III SHORT PEN) 31G X 8 MM MISC Use once daily 300 each 0  . lisinopril (PRINIVIL,ZESTRIL) 2.5 MG tablet Take 1 tablet (2.5 mg total) by mouth daily. 30 tablet 5  . lovastatin (MEVACOR) 20 MG tablet  TAKE 1 TABLET(20 MG) BY MOUTH AT BEDTIME 90 tablet 1  . metoprolol succinate (TOPROL-XL) 25 MG 24 hr tablet Take 1 tablet (25 mg total) by mouth daily. 30 tablet 5  . omeprazole (PRILOSEC) 20 MG capsule TAKE 1 CAPSULE(20 MG) BY MOUTH DAILY 90 capsule 1  . ondansetron (ZOFRAN) 4 MG tablet Take 4 mg by mouth every 8 (eight) hours as needed for nausea or vomiting.    . ranitidine (ZANTAC) 150 MG tablet Take 150 mg by mouth daily as needed for heartburn.    . silver sulfADIAZINE (SILVADENE) 1 % cream Apply 1 application topically daily as needed (skin irritation).    . triamcinolone cream (KENALOG) 0.1 % Apply 1 application topically 2 (two) times daily as needed. 45 g 4   No current facility-administered medications for this visit.     Allergies  Allergen Reactions  . Cortisone     swelling  . Tetanus Toxoids     Swelling, not specified  . Hydrocodone-Homatropine Nausea And Vomiting      Review of Systems:   General:  decreased appetite, decreased energy, no weight gain, + weight loss, no fever  Cardiac:  no chest pain with exertion, no chest pain at rest, +SOB with mild exertion, no resting SOB, no PND, no orthopnea, no palpitations, no arrhythmia, no atrial fibrillation, no LE edema, no dizzy spells, no syncope  Respiratory:  + exertional shortness of breath, no home oxygen, no productive cough, no dry cough, no bronchitis, no wheezing, no hemoptysis, no asthma, no pain with inspiration or cough, no sleep apnea, no CPAP at night  GI:   no difficulty swallowing, no reflux, + frequent heartburn, no hiatal hernia, no abdominal pain,  no constipation, no diarrhea, no hematochezia, no hematemesis, no melena  GU:   no dysuria,  no frequency, no urinary tract infection, no hematuria, no enlarged prostate, no kidney stones, no kidney disease  Vascular:  no pain suggestive of claudication, no pain in feet, no leg cramps, no varicose veins, no DVT, no non-healing foot ulcer  Neuro:   no stroke, no TIA's, no seizures, no headaches, no temporary blindness one eye,  no slurred speech, no peripheral neuropathy, no chronic pain, no instability of gait, no memory/cognitive dysfunction  Musculoskeletal: no arthritis, no joint swelling, no myalgias, no difficulty walking, normaL mobility   Skin:   NO rash, NO itching, NO skin infections, NO pressure sores or ulcerations  Psych:   no anxiety, no depression, no nervousness, no unusual recent stress  Eyes:   no blurry vision, no floaters, no recent vision changes, does not wear glasses or contacts  ENT:   no hearing loss, no loose or painful teeth, complete dentures  Hematologic:  no easy bruising, no abnormal bleeding, no clotting disorder, no frequent epistaxis  Endocrine:  + diabetes, does check CBG's at home       Physical Exam:   BP (!) 148/73 (BP Location: Right Arm, Patient Position: Sitting, Cuff Size: Normal)   Pulse 69   Ht _0  (1.778 m)   SpO2 97% Comment: RA  BMI 25.83 kg/m   General:  Well, developed, well-nourished elderly but well-appearing  HEENT:  NCAT, PERLA, EOMI, oropharynx clear, scar right temple from skin cancer resection and skin graft  Neck:   no JVD, + bruits or transmitted murmur bilaterally, no adenopathy or thyromegaly.  Chest:   clear to auscultation, symmetrical breath sounds, no wheezes, no rhonchi   CV:   RRR, grade III/VI crescendo/decrescendo  murmur heard best at RSB,  no diastolic murmur  Abdomen:  soft, non-tender, no masses or organomegaly  Extremities:  warm, well-perfused, dp and pt pulses palpable, no LE  edema  Rectal/GU  Deferred  Neuro:   Grossly non-focal and symmetrical throughout  Skin:   Clean and dry, no rashes, no breakdown   Diagnostic Tests:         [facility]                        [address], [place] [address]                            [phone]  ------------------------------------------------------------------- Transthoracic Echocardiography  Patient:    Rajeev, Escue MR #:       [id number] Study Date: 11/10/2017 Gender:     M Age:        67 Height:     177.8 cm Weight:     83 kg BSA:        2.04 m^2 Pt. Status: Room:   SONOGRAPHER  Wyatt Mage, RDCS  PERFORMING   [facility], Outpatient  ATTENDING    Skeet Latch, MD  ORDERING     Skeet Latch, MD  [place], MD  cc:  ------------------------------------------------------------------- LV EF: 65% -   70%  ------------------------------------------------------------------- Indications:      Aortic stenosis (I35).  ------------------------------------------------------------------- History:   PMH:   Aortic valve disease.  Risk factors: Hypertension. Diabetes mellitus. Dyslipidemia.  ------------------------------------------------------------------- Study Conclusions  - Left ventricle: The cavity size was normal. Wall thickness was   normal. Systolic function was vigorous. The estimated ejection   fraction was in the range of 65% to 70%. Wall motion was normal;   there were no regional wall motion abnormalities. Doppler   parameters are consistent with abnormal left ventricular   relaxation (grade 1 diastolic dysfunction). - Aortic valve: Noncoronary cusp mobility was severely restricted.   Transvalvular velocity was increased more than expected, due to   high cardiac output. There was moderate to severe stenosis. There   was moderate regurgitation directed centrally in the LVOT. Mean   gradient (S): 42 mm Hg. Peak  gradient (S): 75 mm Hg. - Mitral valve: Calcified annulus. There was mild to moderate   regurgitation directed centrally. - Left atrium: The atrium was mildly dilated. - Atrial septum: No defect or patent foramen ovale was identified. - Pulmonary arteries: Systolic pressure was mildly increased. PA   peak pressure: 40 mm Hg (S).  Impressions:  - Aortic stenosis has worsened since 2017.  ------------------------------------------------------------------- Labs, prior tests, procedures, and surgery: Transthoracic echocardiography (01/04/2016).    The prosthetic aortic valve showed moderate stenosis and moderate perivalvular regurgitation.  EF was 65%. Aortic valve: peak gradient of 43 mm Hg and mean gradient of 23 mm Hg.  ------------------------------------------------------------------- Study data:  Comparison was made to the study of 01/04/2016.  Study status:  Routine.  Procedure:  The patient reported no pain pre or post test. Transthoracic echocardiography. Image quality was adequate.  Study completion:  There were no complications. Transthoracic echocardiography.  M-mode, complete 2D, 3D, spectral Doppler, and color Doppler.  Birthdate:  Patient birthdate: December 18, 1940.  Age:  Patient is 78 yr old.  Sex:  Gender:  male. BMI: 26.3 kg/m^2.  Blood pressure:     133/66  Patient status: Outpatient.  Study date:  Study date: 11/10/2017. Study time: 07:27 AM.  Location:  [facility]  -------------------------------------------------------------------  ------------------------------------------------------------------- Left ventricle:  The cavity size was normal. Wall thickness was normal. Systolic function was vigorous. The estimated ejection fraction was in the range of 65% to 70%. Wall motion was normal; there were no regional wall motion abnormalities. Doppler parameters are consistent with abnormal left ventricular relaxation (grade 1 diastolic dysfunction).  Indeterminate ventricular filling pressure by Doppler parameters.  ------------------------------------------------------------------- Aortic valve:   Trileaflet; severely thickened, moderately calcified leaflets.  Noncoronary cusp mobility was severely restricted.  Doppler:  Transvalvular velocity was increased more than expected, due to high cardiac output. There was moderate to severe stenosis. The ejection jet is early peaking. There was moderate regurgitation directed centrally in the LVOT.    VTI ratio of LVOT to aortic valve: 0.28. Valve area (VTI): 1.05 cm^2. Indexed valve area (VTI): 0.52 cm^2/m^2. Peak velocity ratio of LVOT to aortic valve: 0.26. Valve area (Vmax): 0.98 cm^2. Indexed valve area (Vmax): 0.48 cm^2/m^2. Mean velocity ratio of LVOT to aortic valve: 0.22. Valve area (Vmean): 0.84 cm^2. Indexed valve area (Vmean): 0.41 cm^2/m^2.    Mean gradient (S): 42 mm Hg. Peak gradient (S): 75 mm Hg.  ------------------------------------------------------------------- Aorta:  Aortic root: The aortic root was normal in size. Ascending aorta: The ascending aorta was normal in size.  ------------------------------------------------------------------- Mitral valve:   Calcified annulus. Leaflet separation was normal. Doppler:  Transvalvular velocity was within the normal range. There was no evidence for stenosis. There was mild to moderate regurgitation directed centrally.    Valve area by pressure half-time: 2.97 cm^2. Indexed valve area by pressure half-time: 1.46 cm^2/m^2.    Peak gradient (D): 3 mm Hg.  ------------------------------------------------------------------- Left atrium:  The atrium was mildly dilated.  ------------------------------------------------------------------- Atrial septum:  No defect or patent foramen ovale was identified.   ------------------------------------------------------------------- Right ventricle:  The cavity size was normal.  Systolic function was normal.  ------------------------------------------------------------------- Pulmonic valve:   Poorly visualized.  Structurally normal valve. The valve appears to be grossly normal.   Cusp separation was normal.  Doppler:  Transvalvular velocity was within the normal range. There was no regurgitation.  ------------------------------------------------------------------- Tricuspid valve:   Structurally normal valve.   Leaflet separation was normal.  Doppler:  Transvalvular velocity was within the normal range. There was mild regurgitation.  ------------------------------------------------------------------- Pulmonary artery:   Systolic pressure was mildly increased.  ------------------------------------------------------------------- Right atrium:  The atrium was normal in size.  ------------------------------------------------------------------- Pericardium:  There was no pericardial effusion.  ------------------------------------------------------------------- Systemic veins: Inferior vena cava: The vessel was normal in size. The respirophasic diameter changes were in the normal range (= 50%), consistent with normal central venous pressure.  ------------------------------------------------------------------- Measurements   Left ventricle                            Value          Reference  LV ID, ED, PLAX chordal                   44    mm       43 - 52  LV ID, ES, PLAX chordal                   26    mm  23 - 38  LV fx shortening, PLAX chordal            41    %        >=29  LV PW thickness, ED                       11    mm       ---------  IVS/LV PW ratio, ED                       0.91           <=1.3  Stroke volume, 2D                         91    ml       ---------  Stroke volume/bsa, 2D                     45    ml/m^2   ---------  LV e&', lateral                            7.83  cm/s     ---------  LV E/e&', lateral                           10.59          ---------  LV e&', medial                             5     cm/s     ---------  LV E/e&', medial                           16.58          ---------  LV e&', average                            6.42  cm/s     ---------  LV E/e&', average                          12.92          ---------    Ventricular septum                        Value          Reference  IVS thickness, ED                         10    mm       ---------    LVOT                                      Value          Reference  LVOT ID, S                                22    mm       ---------  LVOT area                                 3.8   cm^2     ---------  LVOT peak velocity, S                     112   cm/s     ---------  LVOT mean velocity, S                     68    cm/s     ---------  LVOT VTI, S                               29    cm       ---------  LVOT peak gradient, S                     5     mm Hg    ---------  Stroke volume (SV), LVOT DP               110.2 ml       ---------  Stroke index (SV/bsa), LVOT DP            54.1  ml/m^2   ---------    Aortic valve                              Value          Reference  Aortic valve peak velocity, S             433   cm/s     ---------  Aortic valve mean velocity, S             306   cm/s     ---------  Aortic valve VTI, S                       105   cm       ---------  Aortic mean gradient, S                   42    mm Hg    ---------  Aortic peak gradient, S                   75    mm Hg    ---------  VTI ratio, LVOT/AV                        0.28           ---------  Aortic valve area, VTI                    1.05  cm^2     ---------  Aortic valve area/bsa, VTI                0.52  cm^2/m^2 ---------  Velocity ratio, peak, LVOT/AV             0.26           ---------  Aortic valve area, peak velocity          0.98  cm^2     ---------  Aortic valve area/bsa,  peak               0.48  cm^2/m^2 ---------  velocity  Velocity ratio, mean,  LVOT/AV             0.22           ---------  Aortic valve area, mean velocity          0.84  cm^2     ---------  Aortic valve area/bsa, mean               0.41  cm^2/m^2 ---------  velocity  Aortic regurg pressure half-time          314   ms       ---------    Aorta                                     Value          Reference  Aortic root ID, ED                        32    mm       ---------  Ascending aorta ID, A-P, S                34    mm       ---------    Left atrium                               Value          Reference  LA ID, A-P, ES                            40    mm       ---------  LA ID/bsa, A-P                            1.96  cm/m^2   <=2.2  LA volume, S                              57.4  ml       ---------  LA volume/bsa, S                          28.2  ml/m^2   ---------  LA volume, ES, 1-p A4C                    52.3  ml       ---------  LA volume/bsa, ES, 1-p A4C                25.7  ml/m^2   ---------  LA volume, ES, 1-p A2C                    61.3  ml       ---------  LA volume/bsa, ES, 1-p A2C                30.1  ml/m^2   ---------    Mitral valve  Value          Reference  Mitral E-wave peak velocity               82.9  cm/s     ---------  Mitral A-wave peak velocity               80    cm/s     ---------  Mitral deceleration time          (H)     254   ms       150 - 230  Mitral pressure half-time                 74    ms       ---------  Mitral peak gradient, D                   3     mm Hg    ---------  Mitral E/A ratio, peak                    1              ---------  Mitral valve area, PHT, DP                2.97  cm^2     ---------  Mitral valve area/bsa, PHT, DP            1.46  cm^2/m^2 ---------    Pulmonary arteries                        Value          Reference  PA pressure, S, DP                (H)     40    mm Hg    <=30    Tricuspid valve                           Value          Reference  Tricuspid regurg peak  velocity            281   cm/s     ---------  Tricuspid peak RV-RA gradient             32    mm Hg    ---------    Systemic veins                            Value          Reference  Estimated CVP                             8     mm Hg    ---------    Right ventricle                           Value          Reference  TAPSE                                     23.9  mm       ---------  RV pressure,  S, DP                (H)     40    mm Hg    <=30  RV s&', lateral, S                         14.6  cm/s     ---------  Legend: (L)  and  (H)  mark values outside specified reference range.  ------------------------------------------------------------------- Prepared and Electronically Authenticated by  Sanda Klein, MD 2019-06-11T14:00:37  Physicians   Panel Physicians Referring Physician Case Authorizing Physician  Burnell Blanks, MD (Primary)    Procedures   RIGHT/LEFT HEART CATH AND CORONARY ANGIOGRAPHY  Conclusion     Prox RCA lesion is 20% stenosed.  Prox LAD to Mid LAD lesion is 10% stenosed.   1. Mild non-obstructive CAD 2. Severe aortic stenosis (mean gradient 31.7 mmHg, peak to peak gradient 32 mmHg, AVA 0.72 cm2)  Recommendations: Will begin workup for TAVR. We will arrange CT scans and other testing and then make a referral to see Dr. Cyndia Bent or Dr. Roxy Manns. The patient and his family are aware that they will be contacted for follow up by the valve team. I have reviewed the TAVR protocol and procedure with the patient and family today.    Indications   Nonrheumatic aortic valve stenosis [I35.0 (ICD-10-CM)]  Procedural Details/Technique   Technical Details Indication: 77 yo male with history of DM, HTN, HLD and severe aortic stenosis here today for cardiac cath as part of workup for AVR vs TAVR.   Procedure: The risks, benefits, complications, treatment options, and expected outcomes were discussed with the patient. The patient and/or family concurred  with the proposed plan, giving informed consent. The patient was brought to the cath lab after IV hydration was given. The patient was further sedated with Versed and Fentanyl. The IV catheter present in the right antecubital vein was changed for a 5 [place] sheath. A balloon tipped catheter was used to perform a right heart catheterization. The right wrist was prepped and draped in a sterile fashion. 1% lidocaine was used for local anesthesia. Using the modified Seldinger access technique, a 5 French sheath was placed in the right radial artery. 3 mg Verapamil was given through the sheath. 4000 units IV heparin was given. Standard diagnostic catheters were used to perform selective coronary angiography. I crossed the aortic valve with an AL-1 and a straight wire. The sheath was removed from the right radial artery and a Terumo hemostasis band was applied at the arteriotomy site on the right wrist.     Estimated blood loss <50 mL.  During this procedure the patient was administered the following to achieve and maintain moderate conscious sedation: Versed 1 mg, Fentanyl 25 mcg, while the patient's heart rate, blood pressure, and oxygen saturation were continuously monitored. The period of conscious sedation was 31 minutes, of which I was present face-to-face 100% of this time.  Complications   Complications documented before study signed (11/25/2017 9:22 AM EDT)    RIGHT/LEFT HEART CATH AND CORONARY ANGIOGRAPHY   None Documented by Burnell Blanks, MD 11/25/2017 9:10 AM EDT  Time Range: Intraprocedure      Coronary Findings   Diagnostic  Dominance: Right  Left Anterior Descending  Prox LAD to Mid LAD lesion 10% stenosed  Prox LAD to Mid LAD lesion is 10% stenosed.  Right Coronary Artery  Vessel is large.  Prox RCA lesion  20% stenosed  Prox RCA lesion is 20% stenosed.  Intervention   No interventions have been documented.  Coronary Diagrams   Diagnostic Diagram        Implants    No implant documentation for this case.  MERGE Images   Show images for CARDIAC CATHETERIZATION   Link to Procedure Log   Procedure Log    Hemo Data    Most Recent Value  Fick Cardiac Output 5.28 L/min  Fick Cardiac Output Index 2.56 (L/min)/BSA  Aortic Mean Gradient 31.7 mmHg  Aortic Peak Gradient 32 mmHg  Aortic Valve Area 0.72  Aortic Value Area Index 0.35 cm2/BSA  RA A Wave 12 mmHg  RA V Wave 8 mmHg  RA Mean 7 mmHg  RV Systolic Pressure 37 mmHg  RV Diastolic Pressure 4 mmHg  RV EDP 9 mmHg  PA Systolic Pressure 36 mmHg  PA Diastolic Pressure 14 mmHg  PA Mean 23 mmHg  PW A Wave 22 mmHg  PW V Wave 25 mmHg  PW Mean 16 mmHg  AO Systolic Pressure 102 mmHg  AO Diastolic Pressure 59 mmHg  AO Mean 86 mmHg  LV Systolic Pressure 585 mmHg  LV Diastolic Pressure 19 mmHg  LV EDP 22 mmHg  AOp Systolic Pressure 277 mmHg  AOp Diastolic Pressure 66 mmHg  AOp Mean Pressure 97 mmHg  LVp Systolic Pressure 824 mmHg  LVp Diastolic Pressure 16 mmHg  LVp EDP Pressure 20 mmHg  QP/QS 1  TPVR Index 8.97 HRUI  TSVR Index 33.56 HRUI  PVR SVR Ratio 0.09  TPVR/TSVR Ratio 0.27   Carotid Arterial Duplex Study  Indications: Pre TAVR - severe aorta stenosis. Risk Factors: Coronary artery disease.  Performing Technologist: Oda Cogan RDMS, RVT   Examination Guidelines: A complete evaluation includes B-mode imaging, spectral Doppler, color Doppler, and power Doppler as needed of all accessible portions of each vessel. Bilateral testing is considered an integral part of a complete examination. Limited examinations for reoccurring indications may be performed as noted.   Right Carotid Findings: +----------+--------+--------+--------+--------+------------------+      PSV cm/sEDV cm/sStenosisDescribeComments      +----------+--------+--------+--------+--------+------------------+ CCA Prox 76   12                      +----------+--------+--------+--------+--------+------------------+ CCA Distal65   10                     +----------+--------+--------+--------+--------+------------------+ ICA Prox 61   12           intimal thickening +----------+--------+--------+--------+--------+------------------+ ICA Distal78   16                     +----------+--------+--------+--------+--------+------------------+ ECA    87                         +----------+--------+--------+--------+--------+------------------+  +----------+--------+-------+----------------+-------------------+      PSV cm/sEDV cmsDescribe    Arm Pressure (mmHG) +----------+--------+-------+----------------+-------------------+ [id number]       Multiphasic, WNL           +----------+--------+-------+----------------+-------------------+  +---------+--------+--+--------+-+---------+ VertebralPSV cm/s48EDV cm/s7Antegrade +---------+--------+--+--------+-+---------+    Left Carotid Findings: +----------+--------+--------+--------+--------+------------------+      PSV cm/sEDV cm/sStenosisDescribeComments      +----------+--------+--------+--------+--------+------------------+ CCA Prox 88   11                     +----------+--------+--------+--------+--------+------------------+ ICA Prox 55   15           intimal  thickening +----------+--------+--------+--------+--------+------------------+ ICA Distal103   24           tortuous      +----------+--------+--------+--------+--------+------------------+ ECA    81                          +----------+--------+--------+--------+--------+------------------+   +----------+--------+--------+----------------+-------------------+ SubclavianPSV cm/sEDV cm/sDescribe    Arm Pressure (mmHG) +----------+--------+--------+----------------+-------------------+      121       Multiphasic, WNL           +----------+--------+--------+----------------+-------------------+  +---------+--------+--+--------+--+---------+ VertebralPSV cm/s42EDV cm/s10Antegrade +---------+--------+--+--------+--+---------+   Final Interpretation: Right Carotid: Velocities in the right ICA are consistent with a 1-39% stenosis.  Left Carotid: Velocities in the left ICA are consistent with a 1-39% stenosis.  Vertebrals: Bilateral vertebral arteries demonstrate antegrade flow.  *See table(s) above for measurements and observations.    Electronically signed by Harold Barban MD on 12/08/2017 at 4:31:48 PM.   ADDENDUM REPORT: 12/08/2017 19:37  CLINICAL DATA:  77 year old male with severe aortic stenosis being evaluated for a TAVR procedure.  EXAM: Cardiac TAVR CT  TECHNIQUE: The patient was scanned on a [employer]. A 120 kV retrospective scan was triggered in the descending thoracic aorta at 111 HU's. Gantry rotation speed was 250 msecs and collimation was .6 mm. No beta blockade or nitro were given. The 3D data set was reconstructed in 5% intervals of the R-R cycle. Systolic and diastolic phases were analyzed on a dedicated work station using MPR, MIP and VRT modes. The patient received 80 cc of contrast.  FINDINGS: Aortic Valve: Trileaflet aortic valve with severely calcified and thickened leaflets and moderately decreased leaflets excursions there are no calcifications extending into the LVOT.  Aorta: Normal size, no dissection, mild calcifications and atheroma in the ascending aorta and moderate calcifications and atheroma  in the aortic arch and descending thoracic aorta.  Sinotubular Junction: 29 x 27 mm  Ascending Thoracic Aorta: 33 x 33 mm  Aortic Arch: 28 x 27 mm  Descending Thoracic Aorta: 24 x 22 mm  Sinus of Valsalva Measurements:  Non-coronary: 30 mm  Right -coronary: 31 mm  Left -coronary: 30 mm  Coronary Artery Height above Annulus:  Left Main: 16 mm  Right Coronary: 16 mm  Virtual Basal Annulus Measurements:  Maximum/Minimum Diameter: 27.6 x 23.2 mm  Mean Diameter: 24.5 mm  Perimeter: 78.8 mm  Area: 472 mm2  Optimum Fluoroscopic Angle for Delivery: LAO 9 CAU 8  IMPRESSION: 1. Trileaflet aortic valve with severely calcified and thickened leaflets and moderately decreased leaflets excursions there are no calcifications extending into the LVOT. Annular measurements suitable for delivery of a 26 mm Edwards-SAPIEN 3 valve.  2. Sufficient coronary to annulus distance.  3. Optimum Fluoroscopic Angle for Delivery:  LAO 9 CAU 8.  4. No thrombus in the left atrial appendage.   Electronically Signed   By: Ena Dawley   On: 12/08/2017 19:37   Addended by Dorothy Spark, MD on 12/08/2017 7:40 PM    Study Result   EXAM: OVER-READ INTERPRETATION  CT CHEST  The following report is an over-read performed by radiologist Dr. Vinnie Langton of [facility], [place] on 12/08/2017. This over-read does not include interpretation of cardiac or coronary anatomy or pathology. The coronary calcium score/coronary CTA interpretation by the cardiologist is attached.  COMPARISON:  Chest CT 11/10/2011.  FINDINGS: Extracardiac findings will be described separately under dictation for contemporaneously obtained CTA chest, abdomen and pelvis.  IMPRESSION: Please see separate dictation  for contemporaneously obtained CTA chest, abdomen and pelvis dated 12/08/2017 for full description of relevant extracardiac findings.  Electronically  Signed: By: Vinnie Langton M.D. On: 12/08/2017 15:38       CLINICAL DATA:  77 year old male with history of severe aortic stenosis. Preprocedural study prior to potential transcatheter aortic valve replacement (TAVR).  EXAM: CT CHEST, ABDOMEN AND PELVIS WITHOUT CONTRAST  TECHNIQUE: Multidetector CT imaging of the chest, abdomen and pelvis was performed following the standard protocol without IV contrast.  COMPARISON:  Chest CT 11/10/2011. CT the abdomen and pelvis 11/19/2011.  FINDINGS: CTA CHEST FINDINGS  Cardiovascular: Heart size is normal. There is no significant pericardial fluid, thickening or pericardial calcification. There is aortic atherosclerosis, as well as atherosclerosis of the great vessels of the mediastinum and the coronary arteries, including calcified atherosclerotic plaque in the left main, left anterior descending and right coronary arteries. Thickening calcification of the aortic valve. Mild mitral annular calcification.  Mediastinum/Lymph Nodes: No pathologically enlarged mediastinal or hilar lymph nodes. Esophagus is unremarkable in appearance. No axillary lymphadenopathy.  Lungs/Pleura: 5 x 3 mm nodule (mean diameter 4 mm) in the anterior aspect of the right lower lobe abutting the major fissure (axial image 40 of series 15), unchanged dating back to 2013, considered definitively benign, presumably a subpleural lymph node. No other suspicious appearing pulmonary nodules or masses are noted. No acute consolidative airspace disease. No pleural effusions.  Musculoskeletal/Soft Tissues: There are no aggressive appearing lytic or blastic lesions noted in the visualized portions of the skeleton.  CTA ABDOMEN AND PELVIS FINDINGS  Hepatobiliary: No suspicious cystic or solid hepatic lesions. No intra or extrahepatic biliary ductal dilatation. Gallbladder is normal in appearance.  Pancreas: No pancreatic mass. No pancreatic ductal  dilatation. No pancreatic or peripancreatic fluid or inflammatory changes.  Spleen: Unremarkable.  Adrenals/Urinary Tract: Bilateral kidneys and bilateral adrenal glands are normal in appearance. No hydronephrosis. There is mild left hydroureter. No definite calculus identified along the course of the left ureter. Urinary bladder is normal in appearance.  Stomach/Bowel: Normal appearance of the stomach. No pathologic dilatation of small bowel or colon. Normal appendix.  Vascular/Lymphatic: Aortic atherosclerosis with vascular findings and measurements pertinent to potential TAVR procedure, as detailed below. No aneurysm or dissection noted in the abdominal or pelvic vasculature. Celiac axis, superior mesenteric artery and inferior mesenteric artery are all widely patent without without hemodynamically significant stenosis. Two right and single (with early bifurcation) renal arteries are all widely patent without hemodynamically significant stenosis. No lymphadenopathy noted in the abdomen or pelvis.  Reproductive: Prostate gland and seminal vesicles are unremarkable in appearance.  Other: No significant volume of ascites.  No pneumoperitoneum.  Musculoskeletal: There are no aggressive appearing lytic or blastic lesions noted in the visualized portions of the skeleton.  VASCULAR MEASUREMENTS PERTINENT TO TAVR:  AORTA:  Minimal Aortic Diameter-15 x 15 mm  Severity of Aortic Calcification-mild  RIGHT PELVIS:  Right Common Iliac Artery -  Minimal Diameter-11.4 x 10.5 mm  Tortuosity-mild-to-moderate  Calcification-none  Right External Iliac Artery -  Minimal Diameter-8.6 x 9.3 mm  Tortuosity-moderate  Calcification - none  Right Common Femoral Artery -  Minimal Diameter-9.5 x 8.5 mm  Tortuosity-mild  Calcification - none  LEFT PELVIS:  Left Common Iliac Artery -  Minimal Diameter-10.8 x 10.6  mm  Tortuosity-moderate  Calcification - none  Left External Iliac Artery -  Minimal Diameter-8.2 x 8.5 mm  Tortuosity-moderate to severe  Calcification - none  Left Common Femoral Artery -  Minimal  Diameter-9.0 x 8.9 mm  Tortuosity-mild  Calcification - none  Review of the MIP images confirms the above findings.  IMPRESSION: 1. Vascular findings and measurements pertinent to potential TAVR procedure, as detailed above. 2. Severe thickening calcification of the aortic valve, compatible with the reported clinical history of severe aortic stenosis. 3. Aortic atherosclerosis, in addition to left main and 2 vessel coronary artery disease. Please note that although the presence of coronary artery calcium documents the presence of coronary artery disease, the severity of this disease and any potential stenosis cannot be assessed on this non-gated CT examination. Assessment for potential risk factor modification, dietary therapy or pharmacologic therapy may be warranted, if clinically indicated. 4. Additional incidental findings, as above.  Aortic Atherosclerosis (ICD10-I70.0).   Electronically Signed   By: Vinnie Langton M.D.   On: 12/08/2017 17:09    [facility] Adult Cardiac Surgery Database Version 2.9 RISK SCORES Procedure: Isolated AVR CALCULATE  Risk of Mortality:  2.652%   Renal Failure:  2.531%   Permanent Stroke:  1.192%   Prolonged Ventilation:  8.924%   DSW Infection:  0.157%   Reoperation:  4.342%   Morbidity or Mortality:  15.033%   Short Length of Stay:  33.141%   Long Length of Stay:  7.325%    Impression:  This 77 year old gentleman has stage D, severe, symptomatic aortic stenosis with New York Heart Association class III symptoms of exertional fatigue and shortness of breath with minimal activity, consistent with chronic diastolic congestive heart failure.  I have personally reviewed his 2D echocardiogram, cardiac catheterization, and  CTA studies.  His echocardiogram shows a trileaflet aortic valve with severely thickened and calcified leaflets with restricted mobility.  The mean gradient has increased to 42 mmHg with a dimensionless index of 0.26.  There was moderate aortic insufficiency.  There is mild to moderate mitral insufficiency.  Left ventricular ejection fraction was 65 to 70% with grade 1 diastolic dysfunction.  Cardiac catheterization showed mild nonobstructive coronary disease.  The mean gradient measured at cath was 31.7 mmHg.  Aortic valve area was 0.72 cm.  I agree that aortic valve replacement is indicated in this patient for relief of his progressive symptoms of congestive heart failure and prevention of left ventricular deterioration.  I think he would probably be at moderate risk for open surgical aortic valve replacement but does not want open surgery since he is the sole caregiver for his wife who is in poor medical condition.  I think transcatheter aortic valve replacement would be a reasonable alternative in his case.  His gated cardiac CTA shows anatomy that is favorable for transcatheter aortic valve replacement using a Sapien 3 valve.  His abdominal and pelvic CTA shows suitable pelvic arterial anatomy for transfemoral insertion.  He has a transmitted murmur or bruit on both sides of his neck and had a carotid Doppler examination which shows no significant internal carotid artery stenosis.  The patient and his daughter were  counseled at length regarding treatment alternatives for management of severe symptomatic aortic stenosis. The risks and benefits of surgical intervention has been discussed in detail. Long-term prognosis with medical therapy was discussed. Alternative approaches such as conventional surgical aortic valve replacement, transcatheter aortic valve replacement, and palliative medical therapy were compared and contrasted at length. This discussion was placed in the context of the patient's own  specific clinical presentation and past medical history. All of their questions have been addressed. The patient is eager to proceed with TAVR as soon as possible.  Following the decision to proceed with transcatheter aortic valve replacement, a discussion was held regarding what types of management strategies would be attempted intraoperatively in the event of life-threatening complications, including whether or not the patient would be considered a candidate for the use of cardiopulmonary bypass and/or conversion to open sternotomy for attempted surgical intervention. The patient has been advised of a variety of complications that might develop including but not limited to risks of death, stroke, paravalvular leak, aortic dissection or other major vascular complications, aortic annulus rupture, device embolization, cardiac rupture or perforation, mitral regurgitation, acute myocardial infarction, arrhythmia, heart block or bradycardia requiring permanent pacemaker placement, congestive heart failure, respiratory failure, renal failure, pneumonia, infection, other late complications related to structural valve deterioration or migration, or other complications that might ultimately cause a temporary or permanent loss of functional independence or other long term morbidity. The patient provides full informed consent for the procedure as described and all questions were answered.     Plan:  He will be scheduled for transcatheter aortic valve replacement via transfemoral insertion on 12/22/2017 by Dr. Roxy Manns and Dr. Angelena Form.  I spent 60 minutes performing this consultation and > 50% of this time was spent face to face counseling and coordinating the care of this patient's severe symptomatic aortic stenosis.   Gaye Pollack, MD 12/10/2017

## 2017-12-14 ENCOUNTER — Telehealth: Payer: Self-pay | Admitting: Family Medicine

## 2017-12-14 ENCOUNTER — Other Ambulatory Visit: Payer: Self-pay

## 2017-12-14 DIAGNOSIS — I350 Nonrheumatic aortic (valve) stenosis: Secondary | ICD-10-CM

## 2017-12-14 NOTE — Telephone Encounter (Signed)
Pt returning call to office

## 2017-12-14 NOTE — Telephone Encounter (Signed)
Spoke with granddaughter(dpr) who stated it was the a nurse concerning his upcoming surgery that called and he got it mixed up and they have already spoken with the nurse.

## 2017-12-17 NOTE — Pre-Procedure Instructions (Signed)
Jesse Cordova  12/17/2017      [facility] - [address], [address] [place] [address] Phone: [phone] Fax: [phone]    Your procedure is scheduled on July 23  Report to [facility] at 1100 A.M.  Call this number if you have problems the morning of surgery:  [phone]   Remember:  Do not eat or drink after midnight.      Take these medicines the morning of surgery with A SIP OF WATER NONE  7 days prior to surgery STOP taking any Aleve, Naproxen, Ibuprofen, Motrin, Advil, Goody's, BC's, all herbal medications, fish oil, and all vitamins  Continue aspirin just do not take the morning of surgery   WHAT DO I DO ABOUT MY DIABETES MEDICATION?   Marland Kitchen Do not take oral diabetes medicines (pills) the morning of surgery. empagliflozin (JARDIANCE), glipiZIDE (GLUCOTROL)  DO NOT TAKE EVENING DOSE OF glipiZIDE (GLUCOTROL)  . THE NIGHT BEFORE SURGERY, take _______15____ units of ___Insulin Glargine (LANTUS SOLOSTAR)________insulin.      . The day of surgery, do not take other diabetes injectables, including Byetta (exenatide), Bydureon (exenatide ER), Victoza (liraglutide), or Trulicity (dulaglutide).  . If your CBG is greater than 220 mg/dL, you may take  of your sliding scale (correction) dose of insulin.   How to Manage Your Diabetes Before and After Surgery  Why is it important to control my blood sugar before and after surgery? . Improving blood sugar levels before and after surgery helps healing and can limit problems. . A way of improving blood sugar control is eating a healthy diet by: o  Eating less sugar and carbohydrates o  Increasing activity/exercise o  Talking with your doctor about reaching your blood sugar goals . High blood sugars (greater than 180 mg/dL) can raise your risk of infections and slow your recovery, so you will need to focus on controlling your diabetes during the weeks before  surgery. . Make sure that the doctor who takes care of your diabetes knows about your planned surgery including the date and location.  How do I manage my blood sugar before surgery? . Check your blood sugar at least 4 times a day, starting 2 days before surgery, to make sure that the level is not too high or low. o Check your blood sugar the morning of your surgery when you wake up and every 2 hours until you get to the Short Stay unit. . If your blood sugar is less than 70 mg/dL, you will need to treat for low blood sugar: o Do not take insulin. o Treat a low blood sugar (less than 70 mg/dL) with  cup of clear juice (cranberry or apple), 4 glucose tablets, OR glucose gel. o Recheck blood sugar in 15 minutes after treatment (to make sure it is greater than 70 mg/dL). If your blood sugar is not greater than 70 mg/dL on recheck, call [phone] for further instructions. . Report your blood sugar to the short stay nurse when you get to Short Stay.  . If you are admitted to the hospital after surgery: o Your blood sugar will be checked by the staff and you will probably be given insulin after surgery (instead of oral diabetes medicines) to make sure you have good blood sugar levels. o The goal for blood sugar control after surgery is 80-180 mg/dL.     Do not wear jewelry  Do not wear lotions, powders, or  COLOGNE, or deodorant.  Men may shave face and neck.  Do not bring valuables to the hospital.  [facility] is not responsible for any belongings or valuables.  Contacts, dentures or bridgework may not be worn into surgery.  Leave your suitcase in the car.  After surgery it may be brought to your room.  For patients admitted to the hospital, discharge time will be determined by your treatment team.  Patients discharged the day of surgery will not be allowed to drive home.    Special instructions:   [facility]- Preparing For Surgery  Before surgery, you can play an important role.  Because skin is not sterile, your skin needs to be as free of germs as possible. You can reduce the number of germs on your skin by washing with CHG (chlorahexidine gluconate) Soap before surgery.  CHG is an antiseptic cleaner which kills germs and bonds with the skin to continue killing germs even after washing.    Oral Hygiene is also important to reduce your risk of infection.  Remember - BRUSH YOUR TEETH THE MORNING OF SURGERY WITH YOUR REGULAR TOOTHPASTE  Please do not use if you have an allergy to CHG or antibacterial soaps. If your skin becomes reddened/irritated stop using the CHG.  Do not shave (including legs and underarms) for at least 48 hours prior to first CHG shower. It is OK to shave your face.  Please follow these instructions carefully.   1. Shower the NIGHT BEFORE SURGERY and the MORNING OF SURGERY with CHG.   2. If you chose to wash your hair, wash your hair first as usual with your normal shampoo.  3. After you shampoo, rinse your hair and body thoroughly to remove the shampoo.  4. Use CHG as you would any other liquid soap. You can apply CHG directly to the skin and wash gently with a scrungie or a clean washcloth.   5. Apply the CHG Soap to your body ONLY FROM THE NECK DOWN.  Do not use on open wounds or open sores. Avoid contact with your eyes, ears, mouth and genitals (private parts). Wash Face and genitals (private parts)  with your normal soap.  6. Wash thoroughly, paying special attention to the area where your surgery will be performed.  7. Thoroughly rinse your body with warm water from the neck down.  8. DO NOT shower/wash with your normal soap after using and rinsing off the CHG Soap.  9. Pat yourself dry with a CLEAN TOWEL.  10. Wear CLEAN PAJAMAS to bed the night before surgery, wear comfortable clothes the morning of surgery  11. Place CLEAN SHEETS on your bed the night of your first shower and DO NOT SLEEP WITH PETS.    Day of Surgery:  Do not  apply any deodorants/lotions.  Please wear clean clothes to the hospital/surgery center.   Remember to brush your teeth WITH YOUR REGULAR TOOTHPASTE.    Please read over the following fact sheets that you were given.

## 2017-12-18 ENCOUNTER — Other Ambulatory Visit: Payer: Self-pay

## 2017-12-18 ENCOUNTER — Encounter (HOSPITAL_COMMUNITY)
Admission: RE | Admit: 2017-12-18 | Discharge: 2017-12-18 | Disposition: A | Discharge status: Home / Self Care | Payer: Medicare Other | Source: Ambulatory Visit | Attending: Cardiovascular Disease | Admitting: Cardiovascular Disease

## 2017-12-18 ENCOUNTER — Ambulatory Visit (HOSPITAL_COMMUNITY)
Admission: RE | Admit: 2017-12-18 | Discharge: 2017-12-18 | Disposition: A | Discharge status: Home / Self Care | Payer: Medicare Other | Source: Ambulatory Visit | Attending: Cardiovascular Disease | Admitting: Cardiovascular Disease

## 2017-12-18 ENCOUNTER — Encounter (HOSPITAL_COMMUNITY): Payer: Self-pay

## 2017-12-18 DIAGNOSIS — I10 Essential (primary) hypertension: Secondary | ICD-10-CM | POA: Diagnosis not present

## 2017-12-18 DIAGNOSIS — K219 Gastro-esophageal reflux disease without esophagitis: Secondary | ICD-10-CM | POA: Insufficient documentation

## 2017-12-18 DIAGNOSIS — I350 Nonrheumatic aortic (valve) stenosis: Secondary | ICD-10-CM | POA: Insufficient documentation

## 2017-12-18 DIAGNOSIS — Z7984 Long term (current) use of oral hypoglycemic drugs: Secondary | ICD-10-CM | POA: Insufficient documentation

## 2017-12-18 DIAGNOSIS — Z01812 Encounter for preprocedural laboratory examination: Secondary | ICD-10-CM | POA: Diagnosis present

## 2017-12-18 DIAGNOSIS — I517 Cardiomegaly: Secondary | ICD-10-CM | POA: Diagnosis not present

## 2017-12-18 DIAGNOSIS — Z01810 Encounter for preprocedural cardiovascular examination: Secondary | ICD-10-CM | POA: Diagnosis present

## 2017-12-18 DIAGNOSIS — Z79899 Other long term (current) drug therapy: Secondary | ICD-10-CM | POA: Diagnosis not present

## 2017-12-18 DIAGNOSIS — Z01818 Encounter for other preprocedural examination: Secondary | ICD-10-CM | POA: Diagnosis present

## 2017-12-18 DIAGNOSIS — R9431 Abnormal electrocardiogram [ECG] [EKG]: Secondary | ICD-10-CM | POA: Insufficient documentation

## 2017-12-18 DIAGNOSIS — E119 Type 2 diabetes mellitus without complications: Secondary | ICD-10-CM | POA: Diagnosis not present

## 2017-12-18 LAB — COMPREHENSIVE METABOLIC PANEL
ALT: 20 U/L (ref 0–44)
AST: 23 U/L (ref 15–41)
Albumin: 4 g/dL (ref 3.5–5.0)
Alkaline Phosphatase: 56 U/L (ref 38–126)
Anion gap: 8 (ref 5–15)
BUN: 17 mg/dL (ref 8–23)
CHLORIDE: 110 mmol/L (ref 98–111)
CO2: 22 mmol/L (ref 22–32)
CREATININE: 1.26 mg/dL — AB (ref 0.61–1.24)
Calcium: 9.2 mg/dL (ref 8.9–10.3)
GFR calc Af Amer: >60 mL/min (ref >60)
GFR, EST NON AFRICAN AMERICAN: 53 mL/min — AB (ref >60)
Glucose, Bld: 156 mg/dL — ABNORMAL HIGH (ref 70–99)
Potassium: 4.3 mmol/L (ref 3.5–5.1)
Sodium: 140 mmol/L (ref 135–145)
Total Bilirubin: 1 mg/dL (ref 0.3–1.2)
Total Protein: 7 g/dL (ref 6.5–8.1)

## 2017-12-18 LAB — SURGICAL PCR SCREEN
MRSA, PCR: NEGATIVE
STAPHYLOCOCCUS AUREUS: NEGATIVE

## 2017-12-18 LAB — CBC
HEMATOCRIT: 48.5 % (ref 39.0–52.0)
HEMOGLOBIN: 15.7 g/dL (ref 13.0–17.0)
MCH: 29.6 pg (ref 26.0–34.0)
MCHC: 32.4 g/dL (ref 30.0–36.0)
MCV: 91.5 fL (ref 78.0–100.0)
Platelets: 205 10*3/uL (ref 150–400)
RBC: 5.3 MIL/uL (ref 4.22–5.81)
RDW: 13.5 % (ref 11.5–15.5)
WBC: 7.5 10*3/uL (ref 4.0–10.5)

## 2017-12-18 LAB — BLOOD GAS, ARTERIAL
Acid-base deficit: 1.8 mmol/L (ref 0.0–2.0)
Bicarbonate: 21.5 mmol/L (ref 20.0–28.0)
Drawn by: 449841
FIO2: 21
O2 Saturation: 89.4 %
PCO2 ART: 30.6 mmHg — AB (ref 32.0–48.0)
PH ART: 7.462 — AB (ref 7.350–7.450)
PO2 ART: 54.2 mmHg — AB (ref 83.0–108.0)
Patient temperature: 98.6

## 2017-12-18 LAB — URINALYSIS, ROUTINE W REFLEX MICROSCOPIC
Bacteria, UA: NONE SEEN
Bilirubin Urine: NEGATIVE
HGB URINE DIPSTICK: NEGATIVE
Ketones, ur: NEGATIVE mg/dL
Leukocytes, UA: NEGATIVE
Nitrite: NEGATIVE
PH: 5 (ref 5.0–8.0)
Protein, ur: NEGATIVE mg/dL
SPECIFIC GRAVITY, URINE: 1.025 (ref 1.005–1.030)

## 2017-12-18 LAB — TYPE AND SCREEN
ABO/RH(D): A POS
ANTIBODY SCREEN: NEGATIVE

## 2017-12-18 LAB — GLUCOSE, CAPILLARY: GLUCOSE-CAPILLARY: 192 mg/dL — AB (ref 70–99)

## 2017-12-18 LAB — APTT: aPTT: 40 seconds — ABNORMAL HIGH (ref 24–36)

## 2017-12-18 LAB — BRAIN NATRIURETIC PEPTIDE: B Natriuretic Peptide: 144 pg/mL — ABNORMAL HIGH (ref 0.0–100.0)

## 2017-12-18 LAB — ABO/RH: ABO/RH(D): A POS

## 2017-12-18 LAB — PROTIME-INR
INR: 1.08
Prothrombin Time: 13.9 seconds (ref 11.4–15.2)

## 2017-12-18 LAB — HEMOGLOBIN A1C
Hgb A1c MFr Bld: 7.1 % — ABNORMAL HIGH (ref 4.8–5.6)
Mean Plasma Glucose: 157.07 mg/dL

## 2017-12-18 NOTE — Progress Notes (Addendum)
PCP: Sallee Lange, MD  Cardiologist:  Skeet Latch, MD  EKG: obtained today per order  Stress test: 11/10/17 in [facility]  ECHO:11/10/17 in EPIC  Cardiac Cath:  11/25/17 in EPIC  Chest x-ray: obtained today per order

## 2017-12-21 MED ORDER — NOREPINEPHRINE 4 MG/250ML-% IV SOLN
0.0000 ug/min | INTRAVENOUS | Status: DC
  Filled 2017-12-21: qty 250

## 2017-12-21 MED ORDER — SODIUM CHLORIDE 0.9 % IV SOLN
INTRAVENOUS | Status: DC
  Filled 2017-12-21: qty 30

## 2017-12-21 MED ORDER — SODIUM CHLORIDE 0.9 % IV SOLN
INTRAVENOUS | Status: DC
  Filled 2017-12-21: qty 1

## 2017-12-21 MED ORDER — NITROGLYCERIN IN D5W 200-5 MCG/ML-% IV SOLN
2.0000 ug/min | INTRAVENOUS | Status: DC
  Filled 2017-12-21: qty 250

## 2017-12-21 MED ORDER — POTASSIUM CHLORIDE 2 MEQ/ML IV SOLN
80.0000 meq | INTRAVENOUS | Status: DC
  Filled 2017-12-21: qty 40

## 2017-12-21 MED ORDER — DOPAMINE-DEXTROSE 3.2-5 MG/ML-% IV SOLN
0.0000 ug/kg/min | INTRAVENOUS | Status: DC
  Filled 2017-12-21: qty 250

## 2017-12-21 MED ORDER — VANCOMYCIN HCL 10 G IV SOLR
1250.0000 mg | INTRAVENOUS | Status: AC
  Administered 2017-12-22: 1500 mg via INTRAVENOUS
  Filled 2017-12-21: qty 1250

## 2017-12-21 MED ORDER — SODIUM CHLORIDE 0.9 % IV SOLN
30.0000 ug/min | INTRAVENOUS | Status: DC
  Filled 2017-12-21: qty 2

## 2017-12-21 MED ORDER — EPINEPHRINE PF 1 MG/ML IJ SOLN
0.0000 ug/min | INTRAVENOUS | Status: DC
  Filled 2017-12-21: qty 4

## 2017-12-21 MED ORDER — DEXMEDETOMIDINE HCL IN NACL 400 MCG/100ML IV SOLN
0.1000 ug/kg/h | INTRAVENOUS | Status: AC
  Administered 2017-12-22: 6 ug/kg/h via INTRAVENOUS
  Filled 2017-12-21: qty 100

## 2017-12-21 MED ORDER — SODIUM CHLORIDE 0.9 % IV SOLN
1.5000 g | INTRAVENOUS | Status: AC
  Administered 2017-12-22: 1.5 g via INTRAVENOUS
  Filled 2017-12-21: qty 1.5

## 2017-12-21 MED ORDER — MAGNESIUM SULFATE 50 % IJ SOLN
40.0000 meq | INTRAMUSCULAR | Status: DC
  Filled 2017-12-21: qty 9.85

## 2017-12-22 ENCOUNTER — Inpatient Hospital Stay (HOSPITAL_COMMUNITY)
Admission: RE | Admit: 2017-12-22 | Discharge: 2017-12-23 | DRG: 267 | Disposition: A | Discharge status: Home / Self Care | Payer: Medicare Other | Source: Ambulatory Visit | Attending: Cardiovascular Disease | Admitting: Cardiovascular Disease

## 2017-12-22 ENCOUNTER — Encounter (HOSPITAL_COMMUNITY): Payer: Self-pay | Admitting: *Deleted

## 2017-12-22 ENCOUNTER — Inpatient Hospital Stay (HOSPITAL_COMMUNITY): Payer: Medicare Other | Admitting: Certified Registered"

## 2017-12-22 ENCOUNTER — Inpatient Hospital Stay (HOSPITAL_COMMUNITY): Payer: Medicare Other

## 2017-12-22 ENCOUNTER — Other Ambulatory Visit: Payer: Self-pay

## 2017-12-22 ENCOUNTER — Inpatient Hospital Stay (HOSPITAL_COMMUNITY): Payer: Medicare Other | Admitting: Physician Assistant

## 2017-12-22 ENCOUNTER — Encounter (HOSPITAL_COMMUNITY)
Admission: RE | Disposition: A | Discharge status: Home / Self Care | Payer: Self-pay | Source: Ambulatory Visit | Attending: Cardiovascular Disease

## 2017-12-22 DIAGNOSIS — I10 Essential (primary) hypertension: Secondary | ICD-10-CM | POA: Diagnosis present

## 2017-12-22 DIAGNOSIS — Z7982 Long term (current) use of aspirin: Secondary | ICD-10-CM

## 2017-12-22 DIAGNOSIS — Z885 Allergy status to narcotic agent status: Secondary | ICD-10-CM | POA: Diagnosis not present

## 2017-12-22 DIAGNOSIS — Z888 Allergy status to other drugs, medicaments and biological substances status: Secondary | ICD-10-CM | POA: Diagnosis not present

## 2017-12-22 DIAGNOSIS — I352 Nonrheumatic aortic (valve) stenosis with insufficiency: Principal | ICD-10-CM | POA: Diagnosis present

## 2017-12-22 DIAGNOSIS — K219 Gastro-esophageal reflux disease without esophagitis: Secondary | ICD-10-CM | POA: Diagnosis present

## 2017-12-22 DIAGNOSIS — Z794 Long term (current) use of insulin: Secondary | ICD-10-CM

## 2017-12-22 DIAGNOSIS — I350 Nonrheumatic aortic (valve) stenosis: Secondary | ICD-10-CM

## 2017-12-22 DIAGNOSIS — Z79899 Other long term (current) drug therapy: Secondary | ICD-10-CM

## 2017-12-22 DIAGNOSIS — C4A30 Merkel cell carcinoma of unspecified part of face: Secondary | ICD-10-CM | POA: Diagnosis present

## 2017-12-22 DIAGNOSIS — I340 Nonrheumatic mitral (valve) insufficiency: Secondary | ICD-10-CM | POA: Diagnosis not present

## 2017-12-22 DIAGNOSIS — E118 Type 2 diabetes mellitus with unspecified complications: Secondary | ICD-10-CM

## 2017-12-22 DIAGNOSIS — E119 Type 2 diabetes mellitus without complications: Secondary | ICD-10-CM | POA: Diagnosis not present

## 2017-12-22 DIAGNOSIS — Z85821 Personal history of Merkel cell carcinoma: Secondary | ICD-10-CM

## 2017-12-22 DIAGNOSIS — Z006 Encounter for examination for normal comparison and control in clinical research program: Secondary | ICD-10-CM

## 2017-12-22 DIAGNOSIS — Z923 Personal history of irradiation: Secondary | ICD-10-CM | POA: Diagnosis not present

## 2017-12-22 DIAGNOSIS — E785 Hyperlipidemia, unspecified: Secondary | ICD-10-CM | POA: Diagnosis not present

## 2017-12-22 DIAGNOSIS — Z952 Presence of prosthetic heart valve: Secondary | ICD-10-CM | POA: Diagnosis not present

## 2017-12-22 DIAGNOSIS — I2510 Atherosclerotic heart disease of native coronary artery without angina pectoris: Secondary | ICD-10-CM | POA: Diagnosis not present

## 2017-12-22 HISTORY — PX: TRANSCATHETER AORTIC VALVE REPLACEMENT, TRANSFEMORAL: SHX6400

## 2017-12-22 HISTORY — DX: Presence of prosthetic heart valve: Z95.2

## 2017-12-22 HISTORY — PX: TEE WITHOUT CARDIOVERSION: SHX5443

## 2017-12-22 LAB — POCT I-STAT, CHEM 8
BUN: 20 mg/dL (ref 8–23)
CALCIUM ION: 1.25 mmol/L (ref 1.15–1.40)
Chloride: 104 mmol/L (ref 98–111)
Creatinine, Ser: 1 mg/dL (ref 0.61–1.24)
Glucose, Bld: 142 mg/dL — ABNORMAL HIGH (ref 70–99)
HCT: 39 % (ref 39.0–52.0)
Hemoglobin: 13.3 g/dL (ref 13.0–17.0)
Potassium: 4.2 mmol/L (ref 3.5–5.1)
Sodium: 139 mmol/L (ref 135–145)
TCO2: 21 mmol/L — ABNORMAL LOW (ref 22–32)

## 2017-12-22 LAB — GLUCOSE, CAPILLARY
GLUCOSE-CAPILLARY: 126 mg/dL — AB (ref 70–99)
GLUCOSE-CAPILLARY: 149 mg/dL — AB (ref 70–99)
Glucose-Capillary: 143 mg/dL — ABNORMAL HIGH (ref 70–99)

## 2017-12-22 SURGERY — IMPLANTATION, AORTIC VALVE, TRANSCATHETER, FEMORAL APPROACH
Anesthesia: Monitor Anesthesia Care | Site: Chest

## 2017-12-22 MED ORDER — LORATADINE 10 MG PO TABS: 10.0000 mg | ORAL_TABLET | Freq: Every day | ORAL | Status: DC | PRN

## 2017-12-22 MED ORDER — METOPROLOL TARTRATE 5 MG/5ML IV SOLN: 2.5000 mg | INTRAVENOUS | Status: DC | PRN

## 2017-12-22 MED ORDER — SODIUM CHLORIDE 0.9 % IV SOLN
INTRAVENOUS | Status: AC
  Filled 2017-12-22: qty 1.2

## 2017-12-22 MED ORDER — LACTATED RINGERS IV SOLN
INTRAVENOUS | Status: DC | PRN
  Administered 2017-12-22: 12:00:00 via INTRAVENOUS

## 2017-12-22 MED ORDER — ONDANSETRON HCL 4 MG/2ML IJ SOLN
4.0000 mg | Freq: Four times a day (QID) | INTRAMUSCULAR | Status: DC | PRN
  Administered 2017-12-22 – 2017-12-23 (×2): 4 mg via INTRAVENOUS
  Filled 2017-12-22 (×2): qty 2

## 2017-12-22 MED ORDER — SODIUM CHLORIDE 0.9% FLUSH
3.0000 mL | Freq: Two times a day (BID) | INTRAVENOUS | Status: DC
  Administered 2017-12-22 – 2017-12-23 (×2): 3 mL via INTRAVENOUS

## 2017-12-22 MED ORDER — ONDANSETRON HCL 4 MG/2ML IJ SOLN
INTRAMUSCULAR | Status: AC
  Filled 2017-12-22: qty 2

## 2017-12-22 MED ORDER — ACETAMINOPHEN 325 MG PO TABS: 650.0000 mg | ORAL_TABLET | Freq: Four times a day (QID) | ORAL | Status: DC | PRN

## 2017-12-22 MED ORDER — SODIUM CHLORIDE 0.9 % IV SOLN: 250.0000 mL | INTRAVENOUS | Status: DC | PRN

## 2017-12-22 MED ORDER — SODIUM CHLORIDE 0.9 % IV SOLN
1.5000 g | Freq: Two times a day (BID) | INTRAVENOUS | Status: DC
  Administered 2017-12-22 – 2017-12-23 (×2): 1.5 g via INTRAVENOUS
  Filled 2017-12-22 (×3): qty 1.5

## 2017-12-22 MED ORDER — SODIUM CHLORIDE 0.9 % IV SOLN: INTRAVENOUS | Status: DC

## 2017-12-22 MED ORDER — LIDOCAINE HCL (PF) 1 % IJ SOLN
INTRAMUSCULAR | Status: AC
  Filled 2017-12-22: qty 30

## 2017-12-22 MED ORDER — HEPARIN SODIUM (PORCINE) 1000 UNIT/ML IJ SOLN
INTRAMUSCULAR | Status: AC
  Filled 2017-12-22: qty 1

## 2017-12-22 MED ORDER — FENTANYL CITRATE (PF) 100 MCG/2ML IJ SOLN
100.0000 ug | Freq: Once | INTRAMUSCULAR | Status: AC
  Administered 2017-12-22: 100 ug via INTRAVENOUS

## 2017-12-22 MED ORDER — CHLORHEXIDINE GLUCONATE 4 % EX LIQD: 60.0000 mL | Freq: Once | CUTANEOUS | Status: DC

## 2017-12-22 MED ORDER — CHLORHEXIDINE GLUCONATE 0.12 % MT SOLN
15.0000 mL | Freq: Once | OROMUCOSAL | Status: AC
  Administered 2017-12-22: 15 mL via OROMUCOSAL

## 2017-12-22 MED ORDER — SODIUM CHLORIDE 0.9% FLUSH: 3.0000 mL | INTRAVENOUS | Status: DC | PRN

## 2017-12-22 MED ORDER — PANTOPRAZOLE SODIUM 40 MG PO TBEC
40.0000 mg | DELAYED_RELEASE_TABLET | Freq: Every day | ORAL | Status: DC
  Administered 2017-12-22 – 2017-12-23 (×2): 40 mg via ORAL
  Filled 2017-12-22 (×2): qty 1

## 2017-12-22 MED ORDER — CHLORHEXIDINE GLUCONATE 4 % EX LIQD: 30.0000 mL | CUTANEOUS | Status: DC

## 2017-12-22 MED ORDER — SODIUM CHLORIDE 0.9 % IV SOLN
INTRAVENOUS | Status: AC
  Administered 2017-12-22: 15:00:00 via INTRAVENOUS

## 2017-12-22 MED ORDER — 0.9 % SODIUM CHLORIDE (POUR BTL) OPTIME
TOPICAL | Status: DC | PRN
  Administered 2017-12-22: 1000 mL

## 2017-12-22 MED ORDER — FAMOTIDINE 20 MG PO TABS
20.0000 mg | ORAL_TABLET | Freq: Every day | ORAL | Status: DC
  Administered 2017-12-22 – 2017-12-23 (×2): 20 mg via ORAL
  Filled 2017-12-22 (×2): qty 1

## 2017-12-22 MED ORDER — ONDANSETRON HCL 4 MG/2ML IJ SOLN
INTRAMUSCULAR | Status: DC | PRN
  Administered 2017-12-22: 4 mg via INTRAVENOUS

## 2017-12-22 MED ORDER — CLOPIDOGREL BISULFATE 75 MG PO TABS
75.0000 mg | ORAL_TABLET | Freq: Every day | ORAL | Status: DC
  Administered 2017-12-23: 75 mg via ORAL
  Filled 2017-12-22: qty 1

## 2017-12-22 MED ORDER — ACETAMINOPHEN 650 MG RE SUPP: 650.0000 mg | Freq: Four times a day (QID) | RECTAL | Status: DC | PRN

## 2017-12-22 MED ORDER — NITROGLYCERIN IN D5W 200-5 MCG/ML-% IV SOLN: 0.0000 ug/min | INTRAVENOUS | Status: DC

## 2017-12-22 MED ORDER — HEPARIN SODIUM (PORCINE) 1000 UNIT/ML IJ SOLN
INTRAMUSCULAR | Status: DC | PRN
  Administered 2017-12-22: 12000 [IU] via INTRAVENOUS

## 2017-12-22 MED ORDER — MIDAZOLAM HCL 2 MG/2ML IJ SOLN
INTRAMUSCULAR | Status: AC
  Administered 2017-12-22: 2 mg via INTRAVENOUS
  Filled 2017-12-22: qty 2

## 2017-12-22 MED ORDER — MIDAZOLAM HCL 2 MG/2ML IJ SOLN
2.0000 mg | Freq: Once | INTRAMUSCULAR | Status: AC
  Administered 2017-12-22: 2 mg via INTRAVENOUS

## 2017-12-22 MED ORDER — HYDRALAZINE HCL 20 MG/ML IJ SOLN
INTRAMUSCULAR | Status: AC
  Filled 2017-12-22: qty 1

## 2017-12-22 MED ORDER — HEPARIN SODIUM (PORCINE) 5000 UNIT/ML IJ SOLN
INTRAMUSCULAR | Status: DC | PRN
  Administered 2017-12-22: 1500 mL

## 2017-12-22 MED ORDER — HYDRALAZINE HCL 20 MG/ML IJ SOLN
10.0000 mg | INTRAMUSCULAR | Status: DC | PRN
  Administered 2017-12-22 (×2): 10 mg via INTRAVENOUS
  Filled 2017-12-22: qty 1

## 2017-12-22 MED ORDER — MORPHINE SULFATE (PF) 2 MG/ML IV SOLN: 2.0000 mg | INTRAVENOUS | Status: DC | PRN

## 2017-12-22 MED ORDER — PROTAMINE SULFATE 10 MG/ML IV SOLN
INTRAVENOUS | Status: AC
  Filled 2017-12-22: qty 25

## 2017-12-22 MED ORDER — PROTAMINE SULFATE 10 MG/ML IV SOLN
INTRAVENOUS | Status: DC | PRN
  Administered 2017-12-22: 120 mg via INTRAVENOUS

## 2017-12-22 MED ORDER — VANCOMYCIN HCL IN DEXTROSE 1-5 GM/200ML-% IV SOLN
1000.0000 mg | Freq: Once | INTRAVENOUS | Status: AC
  Administered 2017-12-22: 1000 mg via INTRAVENOUS
  Filled 2017-12-22: qty 200

## 2017-12-22 MED ORDER — INSULIN ASPART 100 UNIT/ML ~~LOC~~ SOLN
0.0000 [IU] | Freq: Three times a day (TID) | SUBCUTANEOUS | Status: DC
  Administered 2017-12-22 (×2): 2 [IU] via SUBCUTANEOUS
  Administered 2017-12-23: 8 [IU] via SUBCUTANEOUS
  Administered 2017-12-23: 2 [IU] via SUBCUTANEOUS
  Administered 2017-12-23: 4 [IU] via SUBCUTANEOUS

## 2017-12-22 MED ORDER — PROTAMINE SULFATE 10 MG/ML IV SOLN
INTRAVENOUS | Status: AC
  Filled 2017-12-22: qty 15

## 2017-12-22 MED ORDER — OXYCODONE HCL 5 MG PO TABS: 5.0000 mg | ORAL_TABLET | ORAL | Status: DC | PRN

## 2017-12-22 MED ORDER — LISINOPRIL 2.5 MG PO TABS
2.5000 mg | ORAL_TABLET | Freq: Every day | ORAL | Status: DC
  Administered 2017-12-22 – 2017-12-23 (×2): 2.5 mg via ORAL
  Filled 2017-12-22 (×2): qty 1

## 2017-12-22 MED ORDER — CEFAZOLIN SODIUM 1 G IJ SOLR
INTRAMUSCULAR | Status: AC
  Filled 2017-12-22: qty 40

## 2017-12-22 MED ORDER — BENZONATATE 100 MG PO CAPS: 200.0000 mg | ORAL_CAPSULE | Freq: Three times a day (TID) | ORAL | Status: DC | PRN

## 2017-12-22 MED ORDER — TRAMADOL HCL 50 MG PO TABS: 50.0000 mg | ORAL_TABLET | ORAL | Status: DC | PRN

## 2017-12-22 MED ORDER — LIDOCAINE HCL (PF) 1 % IJ SOLN
INTRAMUSCULAR | Status: DC | PRN
  Administered 2017-12-22: 15 mL

## 2017-12-22 MED ORDER — PRAVASTATIN SODIUM 20 MG PO TABS
20.0000 mg | ORAL_TABLET | Freq: Every day | ORAL | Status: DC
Start: 2017-12-22 — End: 2017-12-23
  Administered 2017-12-22 – 2017-12-23 (×2): 20 mg via ORAL
  Filled 2017-12-22 (×2): qty 1

## 2017-12-22 MED ORDER — CHLORHEXIDINE GLUCONATE 0.12 % MT SOLN
OROMUCOSAL | Status: AC
Start: 2017-12-22 — End: 2017-12-22
  Administered 2017-12-22: 15 mL via OROMUCOSAL
  Filled 2017-12-22: qty 15

## 2017-12-22 MED ORDER — ASPIRIN 81 MG PO CHEW
81.0000 mg | CHEWABLE_TABLET | Freq: Every day | ORAL | Status: DC
  Administered 2017-12-23: 81 mg via ORAL
  Filled 2017-12-22: qty 1

## 2017-12-22 MED ORDER — IODIXANOL 320 MG/ML IV SOLN
INTRAVENOUS | Status: DC | PRN
  Administered 2017-12-22: 39.8 mL via INTRAVENOUS

## 2017-12-22 MED ORDER — FENTANYL CITRATE (PF) 100 MCG/2ML IJ SOLN
INTRAMUSCULAR | Status: AC
  Administered 2017-12-22: 100 ug via INTRAVENOUS
  Filled 2017-12-22: qty 2

## 2017-12-22 SURGICAL SUPPLY — 86 items
ADH SKN CLS APL DERMABOND .7 (GAUZE/BANDAGES/DRESSINGS) ×2
BAG DECANTER FOR FLEXI CONT (MISCELLANEOUS) ×2 IMPLANT
BAG SNAP BAND KOVER 36X36 (MISCELLANEOUS) ×4 IMPLANT
BLADE CLIPPER SURG (BLADE) ×2 IMPLANT
BLADE OSCILLATING /SAGITTAL (BLADE) IMPLANT
BLADE STERNUM SYSTEM 6 (BLADE) IMPLANT
CABLE ADAPT CONN TEMP 6FT (ADAPTER) ×4 IMPLANT
CANNULA FEM VENOUS REMOTE 22FR (CANNULA) IMPLANT
CANNULA OPTISITE PERFUSION 16F (CANNULA) IMPLANT
CANNULA OPTISITE PERFUSION 18F (CANNULA) IMPLANT
CATH DIAG EXPO 6F VENT PIG 145 (CATHETERS) ×8 IMPLANT
CATH EXPO 5FR AL1 (CATHETERS) IMPLANT
CATH INFINITI 6F AL2 (CATHETERS) IMPLANT
CATH S G BIP PACING (SET/KITS/TRAYS/PACK) ×4 IMPLANT
CLIP VESOCCLUDE MED 24/CT (CLIP) IMPLANT
CLIP VESOCCLUDE SM WIDE 24/CT (CLIP) IMPLANT
CONT SPEC 4OZ CLIKSEAL STRL BL (MISCELLANEOUS) ×8 IMPLANT
COVER BACK TABLE 24X17X13 BIG (DRAPES) IMPLANT
COVER BACK TABLE 80X110 HD (DRAPES) ×6 IMPLANT
COVER DOME SNAP 22 D (MISCELLANEOUS) IMPLANT
CRADLE DONUT ADULT HEAD (MISCELLANEOUS) ×4 IMPLANT
DERMABOND ADVANCED (GAUZE/BANDAGES/DRESSINGS) ×2
DERMABOND ADVANCED .7 DNX12 (GAUZE/BANDAGES/DRESSINGS) ×2 IMPLANT
DEVICE CLOSURE PERCLS PRGLD 6F (VASCULAR PRODUCTS) ×4 IMPLANT
DRAPE INCISE IOBAN 66X45 STRL (DRAPES) IMPLANT
DRSG TEGADERM 4X4.75 (GAUZE/BANDAGES/DRESSINGS) ×6 IMPLANT
ELECT CAUTERY BLADE 6.4 (BLADE) IMPLANT
ELECT REM PT RETURN 9FT ADLT (ELECTROSURGICAL) ×8
ELECTRODE REM PT RTRN 9FT ADLT (ELECTROSURGICAL) ×4 IMPLANT
FELT TEFLON 6X6 (MISCELLANEOUS) IMPLANT
FEMORAL VENOUS CANN RAP (CANNULA) IMPLANT
GAUZE SPONGE 4X4 12PLY STRL (GAUZE/BANDAGES/DRESSINGS) ×4 IMPLANT
GLOVE BIO SURGEON STRL SZ7.5 (GLOVE) ×4 IMPLANT
GLOVE BIO SURGEON STRL SZ8 (GLOVE) IMPLANT
GLOVE EUDERMIC 7 POWDERFREE (GLOVE) IMPLANT
GLOVE ORTHO TXT STRL SZ7.5 (GLOVE) ×2 IMPLANT
GOWN STRL REUS W/ TWL LRG LVL3 (GOWN DISPOSABLE) IMPLANT
GOWN STRL REUS W/ TWL XL LVL3 (GOWN DISPOSABLE) ×2 IMPLANT
GOWN STRL REUS W/TWL LRG LVL3 (GOWN DISPOSABLE) ×12
GOWN STRL REUS W/TWL XL LVL3 (GOWN DISPOSABLE) ×16
GUIDEWIRE SAFE TJ AMPLATZ EXST (WIRE) ×4 IMPLANT
GUIDEWIRE STRAIGHT .035 260CM (WIRE) ×4 IMPLANT
INSERT FOGARTY SM (MISCELLANEOUS) IMPLANT
KIT BASIN OR (CUSTOM PROCEDURE TRAY) ×4 IMPLANT
KIT DILATOR VASC 18G NDL (KITS) IMPLANT
KIT HEART LEFT (KITS) ×4 IMPLANT
KIT SUCTION CATH 14FR (SUCTIONS) IMPLANT
KIT TURNOVER KIT B (KITS) ×4 IMPLANT
LOOP VESSEL MAXI BLUE (MISCELLANEOUS) IMPLANT
LOOP VESSEL MINI RED (MISCELLANEOUS) IMPLANT
NDL PERC 18GX7CM (NEEDLE) ×2 IMPLANT
NEEDLE 22X1 1/2 (OR ONLY) (NEEDLE) ×2 IMPLANT
NEEDLE PERC 18GX7CM (NEEDLE) ×4 IMPLANT
NS IRRIG 1000ML POUR BTL (IV SOLUTION) ×10 IMPLANT
PACK ENDOVASCULAR (PACKS) ×4 IMPLANT
PAD ARMBOARD 7.5X6 YLW CONV (MISCELLANEOUS) ×8 IMPLANT
PAD ELECT DEFIB RADIOL ZOLL (MISCELLANEOUS) ×4 IMPLANT
PENCIL BUTTON HOLSTER BLD 10FT (ELECTRODE) ×4 IMPLANT
PERCLOSE PROGLIDE 6F (VASCULAR PRODUCTS) ×8
SET MICROPUNCTURE 5F STIFF (MISCELLANEOUS) ×4 IMPLANT
SHEATH BRITE TIP 6FR 35CM (SHEATH) ×4 IMPLANT
SHEATH PINNACLE 6F 10CM (SHEATH) ×4 IMPLANT
SHEATH PINNACLE 8F 10CM (SHEATH) ×4 IMPLANT
SLEEVE REPOSITIONING LENGTH 30 (MISCELLANEOUS) ×4 IMPLANT
SPONGE LAP 4X18 RFD (DISPOSABLE) IMPLANT
STOPCOCK MORSE 400PSI 3WAY (MISCELLANEOUS) ×8 IMPLANT
SUT ETHIBOND X763 2 0 SH 1 (SUTURE) IMPLANT
SUT GORETEX CV 4 TH 22 36 (SUTURE) IMPLANT
SUT GORETEX CV4 TH-18 (SUTURE) IMPLANT
SUT MNCRL AB 3-0 PS2 18 (SUTURE) IMPLANT
SUT PROLENE 5 0 C 1 36 (SUTURE) IMPLANT
SUT PROLENE 6 0 C 1 30 (SUTURE) IMPLANT
SUT SILK  1 MH (SUTURE) ×2
SUT SILK 1 MH (SUTURE) ×2 IMPLANT
SUT VIC AB 2-0 CT1 27 (SUTURE)
SUT VIC AB 2-0 CT1 TAPERPNT 27 (SUTURE) IMPLANT
SUT VIC AB 2-0 CTX 36 (SUTURE) IMPLANT
SUT VIC AB 3-0 SH 8-18 (SUTURE) IMPLANT
SYR 50ML LL SCALE MARK (SYRINGE) ×4 IMPLANT
SYR BULB IRRIGATION 50ML (SYRINGE) IMPLANT
SYR CONTROL 10ML LL (SYRINGE) ×2 IMPLANT
TOWEL GREEN STERILE (TOWEL DISPOSABLE) ×8 IMPLANT
TRANSDUCER W/STOPCOCK (MISCELLANEOUS) ×8 IMPLANT
TRAY FOLEY SLVR 16FR TEMP STAT (SET/KITS/TRAYS/PACK) IMPLANT
VALVE HEART TRANSCATH SZ3 26MM (Prosthesis & Implant Heart) ×2 IMPLANT
WIRE .035 3MM-J 145CM (WIRE) ×4 IMPLANT

## 2017-12-22 NOTE — Plan of Care (Signed)
  Problem: Health Behavior/Discharge Planning: Goal: Ability to manage health-related needs will improve Outcome: Progressing   Problem: Clinical Measurements: Goal: Ability to maintain clinical measurements within normal limits will improve Outcome: Progressing Goal: Will remain free from infection Outcome: Progressing   

## 2017-12-22 NOTE — Progress Notes (Signed)
Pt received from cath lab. Oriented to room and equipment.  Bilateral groin site level 0 - bilateral sites with clean, dry, intact gauze. Old radial a-line site with clean, dry, intact gauze. CHG bath completed. VSS. CBG obtained. Telemetry applied, CCMD notified x2. Dinner ordered per pt request. Family at bedside.   Fritz Pickerel, RN

## 2017-12-22 NOTE — H&P (Signed)
Cardiology Admission History and Physical:   Patient ID: Jesse Cordova; MRN: [id number]; DOB: 16-May-1941   Admission date: 12/22/2017  Primary Care Provider: [name], MD Primary Cardiologist: No primary care provider on file. Oval Linsey  Chief Complaint:  Severe AS, here for TAVR procedure  Patient Profile:   Jesse Cordova is a 77 y.o. male with a history of DM, GERD, HLD, HTN and severe aortic stenosis who is here today for TAVR. He is followed in our office by Dr. Oval Linsey. He has been followed for moderate AS for several years. He also has moderate AI. He has developed progressive fatigue and dyspnea on exertion for several months. Echo 11/10/17 with severe AS with mean gradient of 42 mmHg. Normal LV systolic function. Cardiac cath with mild CAD. He has undergone workup for TAVR and will have a 26 mm Edwards Sapien 3 valve implanted from the right transfemoral approach today.   History of Present Illness:    Jesse Cordova is a 77 y.o. male with a history of DM, GERD, HLD, HTN and severe aortic stenosis who is here today for TAVR. He is followed in our office by Dr. Oval Linsey. He has been followed for moderate AS for several years. He also has moderate AI. He has developed progressive fatigue and dyspnea on exertion for several months. Echo 11/10/17 with severe AS with mean gradient of 42 mmHg. Normal LV systolic function. Cardiac cath with mild CAD. He has undergone workup for TAVR and will have a 26 mm Edwards Sapien 3 valve implanted from the right transfemoral approach today.   Past Medical History:  Diagnosis Date  . 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 Arthritis   . Cancer ([place])    skin cancer  . Chronic cough   . Chronic low back pain   . Complication of anesthesia   . Diabetes mellitus   . GERD (gastroesophageal reflux disease)   . Hyperlipemia   . Hypertension   . Moderate aortic regurgitation 04/18/2016  . Pneumonia   . PONV (postoperative nausea and vomiting)      Past Surgical History:  Procedure Laterality Date  . BIOPSY  01/16/2012   Procedure: BIOPSY;  Surgeon: Rogene Houston, MD;  Location: [facility];  Service: Endoscopy;  Laterality: N/A;  . CATARACT EXTRACTION Right 7/06  . CATARACT EXTRACTION W/PHACO Left 01/05/2014   Procedure: CATARACT EXTRACTION PHACO AND INTRAOCULAR LENS PLACEMENT (IOC);  Surgeon: Tonny Branch, MD;  Location: [facility];  Service: Ophthalmology;  Laterality: Left;  CDE:15.98  . COLONOSCOPY  01/29/12   abnormal repeat in one year  . ESOPHAGOGASTRODUODENOSCOPY    . KNEE ARTHROSCOPY     2 right knee surgeries and 1 left knee surgery  . RIGHT/LEFT HEART CATH AND CORONARY ANGIOGRAPHY N/A 11/25/2017   Procedure: RIGHT/LEFT HEART CATH AND CORONARY ANGIOGRAPHY;  Surgeon: Burnell Blanks, MD;  Location: [facility];  Service: Cardiovascular;  Laterality: N/A;  . TONSILLECTOMY    . VASECTOMY       Medications Prior to Admission: Prior to Admission medications   Medication Sig Start Date End Date Taking? Authorizing Provider  ACCU-CHEK AVIVA PLUS test strip USE TO TEST SUGAR TWICE DAILY 09/03/16   [name], MD  ACCU-CHEK AVIVA PLUS test strip USE TO TEST SUGAR TWICE DAILY 12/10/16   [name], MD  aspirin EC 81 MG tablet Take 81 mg by mouth daily.    [provider]  benzonatate (TESSALON) 200 MG capsule One  three times daily as needed Patient taking differently: Take 200 mg by mouth 3 (three) times daily as needed for cough. One three times daily as needed 01/22/17   [name], MD  blood glucose meter kit and supplies KIT Dispense based on patient and insurance preference. Use to test glucose twice daily. (For ICD 10 code E11.9) 08/19/17   [name], MD  cetirizine (ZYRTEC) 10 MG tablet Take 10 mg by mouth daily.    [provider]  diphenoxylate-atropine (LOMOTIL) 2.5-0.025 MG tablet TAKE 1 TABLET BY MOUTH FOUR TIMES DAILY AS NEEDED FOR DIARRHEA OR LOOSE STOOLS 06/12/16    Luking, Elayne Snare, MD  empagliflozin (JARDIANCE) 25 MG TABS tablet Take 25 mg by mouth daily. 09/09/17   [name], MD  glipiZIDE (GLUCOTROL) 5 MG tablet TAKE 1/2 TABLET BY MOUTH TWICE DAILY BEFORE A MEAL 09/09/17   Luking, Elayne Snare, MD  ibuprofen (ADVIL,MOTRIN) 200 MG tablet Take 400 mg by mouth every 8 (eight) hours as needed for mild pain or moderate pain.    [provider]  Insulin Glargine (LANTUS SOLOSTAR) 100 UNIT/ML Solostar Pen INJECT UP TO 30 UNITS INTO SKIN EVERY NIGHT AT BEDTIME AS DIRECTED BY DOCTOR 09/09/17   [name], MD  Insulin Pen Needle (B-D ULTRAFINE III SHORT PEN) 31G X 8 MM MISC Use once daily 02/04/17   Luking, Elayne Snare, MD  lisinopril (PRINIVIL,ZESTRIL) 2.5 MG tablet Take 1 tablet (2.5 mg total) by mouth daily. 09/09/17   [name], MD  lovastatin (MEVACOR) 20 MG tablet TAKE 1 TABLET(20 MG) BY MOUTH AT BEDTIME 09/09/17   Luking, Elayne Snare, MD  metoprolol succinate (TOPROL-XL) 25 MG 24 hr tablet Take 1 tablet (25 mg total) by mouth daily. 09/09/17   [name], MD  omeprazole (PRILOSEC) 20 MG capsule TAKE 1 CAPSULE(20 MG) BY MOUTH DAILY 09/09/17   [name], MD  ondansetron (ZOFRAN) 4 MG tablet Take 4 mg by mouth every 8 (eight) hours as needed for nausea or vomiting.    [provider]  ranitidine (ZANTAC) 150 MG tablet Take 150 mg by mouth daily as needed for heartburn.    [provider]  silver sulfADIAZINE (SILVADENE) 1 % cream Apply 1 application topically daily as needed (skin irritation).    [provider]  triamcinolone cream (KENALOG) 0.1 % Apply 1 application topically 2 (two) times daily as needed. 02/18/16   [name], MD     Allergies:    Allergies  Allergen Reactions  . Cortisone     swelling  . Tetanus Toxoids     Swelling, not specified  . Hydrocodone-Homatropine Nausea And Vomiting    Social History:   Social History   Socioeconomic History  . Marital status: Married    Spouse name: Not  on file  . Number of children: Not on file  . Years of education: Not on file  . Highest education level: Not on file  Occupational History  . Not on file  Social Needs  . Financial resource strain: Not on file  . Food insecurity:    Worry: Not on file    Inability: Not on file  . Transportation needs:    Medical: Not on file    Non-medical: Not on file  Tobacco Use  . Smoking status: Never Smoker  . Smokeless tobacco: Never Used  . Tobacco comment: From spouse. Wife does not smoke around him now  Substance and Sexual Activity  .  Alcohol use: No    Alcohol/week: 0.0 oz  . Drug use: No  . Sexual activity: Not on file  Lifestyle  . Physical activity:    Days per week: Not on file    Minutes per session: Not on file  . Stress: Not on file  Relationships  . Social connections:    Talks on phone: Not on file    Gets together: Not on file    Attends religious service: Not on file    Active member of club or organization: Not on file    Attends meetings of clubs or organizations: Not on file    Relationship status: Not on file  . Intimate partner violence:    Fear of current or ex partner: Not on file    Emotionally abused: Not on file    Physically abused: Not on file    Forced sexual activity: Not on file  Other Topics Concern  . Not on file  Social History Narrative  . Not on file    Family History:   The patient's family history includes Atrial fibrillation in his sister; Depression in his maternal grandfather; Diabetes in his other and sister; Heart disease in his other and sister.    ROS:  Please see the history of present illness.  All other ROS reviewed and negative.     Physical Exam/Data:   Vitals:   12/22/17 1112  BP: (!) 151/58  Pulse: 70  Resp: 20  Temp: 98.5 F (36.9 C)  TempSrc: Oral  SpO2: 97%  Weight: 182 lb (82.6 kg)  Height: '5\' 10"'$  (1.778 m)   No intake or output data in the 24 hours ending 12/22/17 1127 Filed Weights   12/22/17 1112   Weight: 182 lb (82.6 kg)   Body mass index is 26.11 kg/m.  General:  Well nourished, well developed, in no acute distress HEENT: normal Lymph: no adenopathy Neck: no JVD Endocrine:  No thryomegaly Vascular: No carotid bruits; FA pulses 2+ bilaterally without bruits  Cardiac:  normal S1, S2. Loud, harsh systolic murmur.  Lungs:  clear to auscultation bilaterally, no wheezing, rhonchi or rales  Abd: soft, nontender, no hepatomegaly  Ext: no LE edema Musculoskeletal:  No deformities, BUE and BLE strength normal and equal Skin: warm and dry  Neuro:  CNs 2-12 intact, no focal abnormalities noted Psych:  Normal affect    EKG:  The ECG that was done 12/18/17 was personally reviewed and demonstrates NSR with LVH  Relevant CV Studies:  Echo 11/10/17: - Left ventricle: The cavity size was normal. Wall thickness was   normal. Systolic function was vigorous. The estimated ejection   fraction was in the range of 65% to 70%. Wall motion was normal;   there were no regional wall motion abnormalities. Doppler   parameters are consistent with abnormal left ventricular   relaxation (grade 1 diastolic dysfunction). - Aortic valve: Noncoronary cusp mobility was severely restricted.   Transvalvular velocity was increased more than expected, due to   high cardiac output. There was moderate to severe stenosis. There   was moderate regurgitation directed centrally in the LVOT. Mean   gradient (S): 42 mm Hg. Peak gradient (S): 75 mm Hg. - Mitral valve: Calcified annulus. There was mild to moderate   regurgitation directed centrally. - Left atrium: The atrium was mildly dilated. - Atrial septum: No defect or patent foramen ovale was identified. - Pulmonary arteries: Systolic pressure was mildly increased. PA   peak pressure: 40 mm Hg (  S).  Impressions:  - Aortic stenosis has worsened since 2017.  ------------------------------------------------------------------- Labs, prior tests,  procedures, and surgery: Transthoracic echocardiography (01/04/2016).    The prosthetic aortic valve showed moderate stenosis and moderate perivalvular regurgitation.  EF was 65%. Aortic valve: peak gradient of 43 mm Hg and mean gradient of 23 mm Hg.  ------------------------------------------------------------------- Study data:  Comparison was made to the study of 01/04/2016.  Study status:  Routine.  Procedure:  The patient reported no pain pre or post test. Transthoracic echocardiography. Image quality was adequate.  Study completion:  There were no complications. Transthoracic echocardiography.  M-mode, complete 2D, 3D, spectral Doppler, and color Doppler.  Birthdate:  Patient birthdate: [birth year].  Age:  Patient is 77 yr old.  Sex:  Gender: male. BMI: 26.3 kg/m^2.  Blood pressure:     133/66  Patient status: Outpatient.  Study date:  Study date: 11/10/2017. Study time: 07:27 AM.  Location:  [facility]  -------------------------------------------------------------------  ------------------------------------------------------------------- Left ventricle:  The cavity size was normal. Wall thickness was normal. Systolic function was vigorous. The estimated ejection fraction was in the range of 65% to 70%. Wall motion was normal; there were no regional wall motion abnormalities. Doppler parameters are consistent with abnormal left ventricular relaxation (grade 1 diastolic dysfunction). Indeterminate ventricular filling pressure by Doppler parameters.  ------------------------------------------------------------------- Aortic valve:   Trileaflet; severely thickened, moderately calcified leaflets.  Noncoronary cusp mobility was severely restricted.  Doppler:  Transvalvular velocity was increased more than expected, due to high cardiac output. There was moderate to severe stenosis. The ejection jet is early peaking. There was moderate regurgitation directed centrally in  the LVOT.    VTI ratio of LVOT to aortic valve: 0.28. Valve area (VTI): 1.05 cm^2. Indexed valve area (VTI): 0.52 cm^2/m^2. Peak velocity ratio of LVOT to aortic valve: 0.26. Valve area (Vmax): 0.98 cm^2. Indexed valve area (Vmax): 0.48 cm^2/m^2. Mean velocity ratio of LVOT to aortic valve: 0.22. Valve area (Vmean): 0.84 cm^2. Indexed valve area (Vmean): 0.41 cm^2/m^2.    Mean gradient (S): 42 mm Hg. Peak gradient (S): 75 mm Hg.  ------------------------------------------------------------------- Aorta:  Aortic root: The aortic root was normal in size. Ascending aorta: The ascending aorta was normal in size.  ------------------------------------------------------------------- Mitral valve:   Calcified annulus. Leaflet separation was normal. Doppler:  Transvalvular velocity was within the normal range. There was no evidence for stenosis. There was mild to moderate regurgitation directed centrally.    Valve area by pressure half-time: 2.97 cm^2. Indexed valve area by pressure half-time: 1.46 cm^2/m^2.    Peak gradient (D): 3 mm Hg.  ------------------------------------------------------------------- Left atrium:  The atrium was mildly dilated.  ------------------------------------------------------------------- Atrial septum:  No defect or patent foramen ovale was identified.   ------------------------------------------------------------------- Right ventricle:  The cavity size was normal. Systolic function was normal.  ------------------------------------------------------------------- Pulmonic valve:   Poorly visualized.  Structurally normal valve. The valve appears to be grossly normal.   Cusp separation was normal.  Doppler:  Transvalvular velocity was within the normal range. There was no regurgitation.  ------------------------------------------------------------------- Tricuspid valve:   Structurally normal valve.   Leaflet separation was normal.  Doppler:   Transvalvular velocity was within the normal range. There was mild regurgitation.  ------------------------------------------------------------------- Pulmonary artery:   Systolic pressure was mildly increased.  ------------------------------------------------------------------- Right atrium:  The atrium was normal in size.  ------------------------------------------------------------------- Pericardium:  There was no pericardial effusion.  ------------------------------------------------------------------- Systemic veins: Inferior vena cava: The vessel was normal in size. The respirophasic diameter changes were  in the normal range (= 50%), consistent with normal central venous pressure.  ------------------------------------------------------------------- Measurements   Left ventricle                            Value          Reference  LV ID, ED, PLAX chordal                   44    mm       43 - 52  LV ID, ES, PLAX chordal                   26    mm       23 - 38  LV fx shortening, PLAX chordal            41    %        >=29  LV PW thickness, ED                       11    mm       ---------  IVS/LV PW ratio, ED                       0.91           <=1.3  Stroke volume, 2D                         91    ml       ---------  Stroke volume/bsa, 2D                     45    ml/m^2   ---------  LV e&', lateral                            7.83  cm/s     ---------  LV E/e&', lateral                          10.59          ---------  LV e&', medial                             5     cm/s     ---------  LV E/e&', medial                           16.58          ---------  LV e&', average                            6.42  cm/s     ---------  LV E/e&', average                          12.92          ---------    Ventricular septum                        Value          Reference  IVS thickness, ED  10    mm       ---------    LVOT                                       Value          Reference  LVOT ID, S                                22    mm       ---------  LVOT area                                 3.8   cm^2     ---------  LVOT peak velocity, S                     112   cm/s     ---------  LVOT mean velocity, S                     68    cm/s     ---------  LVOT VTI, S                               29    cm       ---------  LVOT peak gradient, S                     5     mm Hg    ---------  Stroke volume (SV), LVOT DP               110.2 ml       ---------  Stroke index (SV/bsa), LVOT DP            54.1  ml/m^2   ---------    Aortic valve                              Value          Reference  Aortic valve peak velocity, S             433   cm/s     ---------  Aortic valve mean velocity, S             306   cm/s     ---------  Aortic valve VTI, S                       105   cm       ---------  Aortic mean gradient, S                   42    mm Hg    ---------  Aortic peak gradient, S                   75    mm Hg    ---------  VTI ratio, LVOT/AV                        0.28           ---------  Aortic valve area, VTI                    1.05  cm^2     ---------  Aortic valve area/bsa, VTI                0.52  cm^2/m^2 ---------  Velocity ratio, peak, LVOT/AV             0.26           ---------  Aortic valve area, peak velocity          0.98  cm^2     ---------  Aortic valve area/bsa, peak               0.48  cm^2/m^2 ---------  velocity  Velocity ratio, mean, LVOT/AV             0.22           ---------  Aortic valve area, mean velocity          0.84  cm^2     ---------  Aortic valve area/bsa, mean               0.41  cm^2/m^2 ---------  velocity  Aortic regurg pressure half-time          314   ms       ---------    Aorta                                     Value          Reference  Aortic root ID, ED                        32    mm       ---------  Ascending aorta ID, A-P, S                34    mm       ---------    Left atrium                                Value          Reference  LA ID, A-P, ES                            40    mm       ---------  LA ID/bsa, A-P                            1.96  cm/m^2   <=2.2  LA volume, S                              57.4  ml       ---------  LA volume/bsa, S                          28.2  ml/m^2   ---------  LA volume, ES, 1-p A4C                    52.3  ml       ---------  LA volume/bsa, ES, 1-p A4C                25.7  ml/m^2   ---------  LA volume, ES, 1-p A2C                    61.3  ml       ---------  LA volume/bsa, ES, 1-p A2C                30.1  ml/m^2   ---------    Mitral valve                              Value          Reference  Mitral E-wave peak velocity               82.9  cm/s     ---------  Mitral A-wave peak velocity               80    cm/s     ---------  Mitral deceleration time          (H)     254   ms       150 - 230  Mitral pressure half-time                 74    ms       ---------  Mitral peak gradient, D                   3     mm Hg    ---------  Mitral E/A ratio, peak                    1              ---------  Mitral valve area, PHT, DP                2.97  cm^2     ---------  Mitral valve area/bsa, PHT, DP            1.46  cm^2/m^2 ---------    Pulmonary arteries                        Value          Reference  PA pressure, S, DP                (H)     40    mm Hg    <=30    Tricuspid valve                           Value          Reference  Tricuspid regurg peak velocity            281   cm/s     ---------  Tricuspid peak RV-RA gradient             32    mm Hg    ---------    Systemic veins                            Value          Reference  Estimated CVP  8     mm Hg    ---------    Right ventricle                           Value          Reference  TAPSE                                     23.9  mm       ---------  RV pressure, S, DP                (H)     40    mm Hg    <=30  RV s&', lateral, S                          14.6  cm/s     ---------  Coronary CTA:  Aortic Valve: Trileaflet aortic valve with severely calcified and thickened leaflets and moderately decreased leaflets excursions there are no calcifications extending into the LVOT.  Aorta: Normal size, no dissection, mild calcifications and atheroma in the ascending aorta and moderate calcifications and atheroma in the aortic arch and descending thoracic aorta.  Sinotubular Junction: 29 x 27 mm  Ascending Thoracic Aorta: 33 x 33 mm  Aortic Arch: 28 x 27 mm  Descending Thoracic Aorta: 24 x 22 mm  Sinus of Valsalva Measurements:  Non-coronary: 30 mm  Right -coronary: 31 mm  Left -coronary: 30 mm  Coronary Artery Height above Annulus:  Left Main: 16 mm  Right Coronary: 16 mm  Virtual Basal Annulus Measurements:  Maximum/Minimum Diameter: 27.6 x 23.2 mm  Mean Diameter: 24.5 mm  Perimeter: 78.8 mm  Area: 472 mm2  Optimum Fluoroscopic Angle for Delivery: LAO 9 CAU 8  IMPRESSION: 1. Trileaflet aortic valve with severely calcified and thickened leaflets and moderately decreased leaflets excursions there are no calcifications extending into the LVOT. Annular measurements suitable for delivery of a 26 mm Edwards-SAPIEN 3 valve.  2. Sufficient coronary to annulus distance.  3. Optimum Fluoroscopic Angle for Delivery:  LAO 9 CAU 8.  4. No thrombus in the left atrial appendage.  Cardiac cath 11/25/17:  Prox RCA lesion is 20% stenosed.  Prox LAD to Mid LAD lesion is 10% stenosed.   1. Mild non-obstructive CAD 2. Severe aortic stenosis (mean gradient 31.7 mmHg, peak to peak gradient 32 mmHg, AVA 0.72 cm2)  Laboratory Data:  Chemistry Recent Labs  Lab 12/18/17 1254  NA 140  K 4.3  CL 110  CO2 22  GLUCOSE 156*  BUN 17  CREATININE 1.26*  CALCIUM 9.2  GFRNONAA 53*  GFRAA >60  ANIONGAP 8    Recent Labs  Lab 12/18/17 1254  PROT 7.0  ALBUMIN 4.0  AST 23  ALT 20  ALKPHOS 56  BILITOT  1.0   Hematology Recent Labs  Lab 12/18/17 1254  WBC 7.5  RBC 5.30  HGB 15.7  HCT 48.5  MCV 91.5  MCH 29.6  MCHC 32.4  RDW 13.5  PLT 205   Cardiac EnzymesNo results for input(s): TROPONINI in the last 168 hours. No results for input(s): TROPIPOC in the last 168 hours.  BNP Recent Labs  Lab 12/18/17 1254  BNP 144.0*    DDimer No results for input(s): DDIMER in the last 168 hours.  Radiology/Studies:  No results found.  Assessment and Plan:   1. Severe aortic stenosis: Plan for TAVR today.   Severity of Illness: The appropriate patient status for this patient is INPATIENT. Inpatient status is judged to be reasonable and necessary in order to provide the required intensity of service to ensure the patient's safety. The patient's presenting symptoms, physical exam findings, and initial radiographic and laboratory data in the context of their chronic comorbidities is felt to place them at high risk for further clinical deterioration. Furthermore, it is not anticipated that the patient will be medically stable for discharge from the hospital within 2 midnights of admission. The following factors support the patient status of inpatient.   " The patient's presenting symptoms include dyspnea, fatigue. " The worrisome physical exam findings include findings of severe AS, moderate AI " The initial radiographic and laboratory data are worrisome because of severe AS " The chronic co-morbidities include DM, HTN   * I certify that at the point of admission it is my clinical judgment that the patient will require inpatient hospital care spanning beyond 2 midnights from the point of admission due to high intensity of service, high risk for further deterioration and high frequency of surveillance required.*    For questions or updates, please contact [facility] Please consult [email or web address] for contact info under Cardiology/STEMI.    Signed, Lauree Chandler, MD  12/22/2017  11:27 AM

## 2017-12-22 NOTE — Anesthesia Procedure Notes (Signed)
Central Venous Catheter Insertion Performed by: Lillia Abed, MD, anesthesiologist Start/End7/23/2019 12:00 PM, 12/22/2017 12:10 PM Patient location: Pre-op. Preanesthetic checklist: patient identified, IV checked, risks and benefits discussed, surgical consent, monitors and equipment checked, pre-op evaluation, timeout performed and anesthesia consent Lidocaine 1% used for infiltration and patient sedated Hand hygiene performed  and maximum sterile barriers used  Catheter size: 8 Fr Total catheter length 16. Central line was placed.Double lumen Procedure performed using ultrasound guided technique. Ultrasound Notes:anatomy identified, needle tip was noted to be adjacent to the nerve/plexus identified, no ultrasound evidence of intravascular and/or intraneural injection and image(s) printed for medical record Attempts: 1 Following insertion, dressing applied, line sutured and Biopatch. Post procedure assessment: blood return through all ports, free fluid flow and no air  Patient tolerated the procedure well with no immediate complications.

## 2017-12-22 NOTE — CV Procedure (Signed)
[facility]  TAVR OPERATIVE NOTE   Date of Procedure:  12/22/2017  Preoperative Diagnosis: Severe Aortic Stenosis   Postoperative Diagnosis: Same   Procedure:    Transcatheter Aortic Valve Replacement - Transfemoral Approach  Edwards Sapien 3 THV (size 26 mm, model # [id number], serial # [id number])   Co-Surgeons:  Lauree Chandler, MD and Valentina Gu. Roxy Manns, MD  Anesthesiologist:  Conrad Costilla  Echocardiographer:  Johnsie Cancel  Pre-operative Echo Findings:  Severe aortic stenosis  Normal left ventricular systolic function  Post-operative Echo Findings:  No paravalvular leak  Normal left ventricular systolic function  BRIEF CLINICAL NOTE AND INDICATIONS FOR SURGERY  KORDAE BUONOCORE is a 77 y.o. male with a history of DM, GERD, HLD, HTN and severe aortic stenosis who is here today for TAVR. He is followed in our office by Dr. Oval Linsey. He has been followed for moderate AS for several years. He also has moderate AI. He has developed progressive fatigue and dyspnea on exertion for several months. Echo 11/10/17 with severe AS with mean gradient of 42 mmHg. Normal LV systolic function. Cardiac cath with mild CAD. He has undergone workup for TAVR and will have a 26 mm Edwards Sapien 3 valve implanted from the right transfemoral approach today.   During the course of the patient's preoperative work up they have been evaluated comprehensively by a multidisciplinary team of specialists coordinated through the [facility] in the [facility].  They have been demonstrated to suffer from symptomatic severe aortic stenosis as noted above. The patient has been counseled extensively as to the relative risks and benefits of all options for the treatment of severe aortic stenosis including long term medical therapy, conventional surgery for aortic valve replacement, and transcatheter aortic valve replacement.  The patient has been independently  evaluated by Dr. Cyndia Bent from [facility], and they are felt to be at high risk for conventional surgical aortic valve replacement. Both surgeons indicated the patient would be a poor candidate for conventional surgery. Based upon review of all of the patient's preoperative diagnostic tests they are felt to be candidate for transcatheter aortic valve replacement using the transfemoral approach as an alternative to high risk conventional surgery.    Following the decision to proceed with transcatheter aortic valve replacement, a discussion has been held regarding what types of management strategies would be attempted intraoperatively in the event of life-threatening complications, including whether or not the patient would be considered a candidate for the use of cardiopulmonary bypass and/or conversion to open sternotomy for attempted surgical intervention.  The patient has been advised of a variety of complications that might develop peculiar to this approach including but not limited to risks of death, stroke, paravalvular leak, aortic dissection or other major vascular complications, aortic annulus rupture, device embolization, cardiac rupture or perforation, acute myocardial infarction, arrhythmia, heart block or bradycardia requiring permanent pacemaker placement, congestive heart failure, respiratory failure, renal failure, pneumonia, infection, other late complications related to structural valve deterioration or migration, or other complications that might ultimately cause a temporary or permanent loss of functional independence or other long term morbidity.  The patient provides full informed consent for the procedure as described and all questions were answered preoperatively.    DETAILS OF THE OPERATIVE PROCEDURE  PREPARATION:   The patient is brought to the operating room on the above mentioned date and central monitoring was established by the anesthesia team including placement of a radial  arterial line. The patient  is placed in the supine position on the operating table.  Intravenous antibiotics are administered. Conscious sedation is used.   Baseline transthoracic echocardiogram was performed. The patient's chest, abdomen, both groins, and both lower extremities are prepared and draped in a sterile manner. A time out procedure is performed.   PERIPHERAL ACCESS:   Using the modified Seldinger technique, femoral arterial and venous access were obtained with placement of 6 Fr sheaths on the left side using u/s guidance.  A pigtail diagnostic catheter was passed through the femoral arterial sheath under fluoroscopic guidance into the aortic root.  A temporary transvenous pacemaker catheter was passed through the femoral venous sheath under fluoroscopic guidance into the right ventricle.  The pacemaker was tested to ensure stable lead placement and pacemaker capture. Aortic root angiography was performed in order to determine the optimal angiographic angle for valve deployment.  TRANSFEMORAL ACCESS:  A micropuncture kit was used to gain access to the right femoral artery using u/s guidance. Position confirmed with angiography. Pre-closure with double ProGlide closure devices. The patient was heparinized systemically and ACT verified > 250 seconds.    A 14 Fr transfemoral E-sheath was introduced into the right femoral artery after progressively dilating over an Amplatz superstiff wire. An AL-2 catheter was used to direct a straight-tip exchange length wire across the native aortic valve into the left ventricle. This was exchanged out for a pigtail catheter over a J wire and position was confirmed in the LV apex. Simultaneous LV and Ao pressures were recorded.  The pigtail catheter was then exchanged for an Amplatz Extra-stiff wire in the LV apex.   TRANSCATHETER HEART VALVE DEPLOYMENT:  An Edwards Sapien 3 THV (size 26 mm) was prepared and crimped per manufacturer's guidelines, and the  proper orientation of the valve is confirmed on the [place] delivery system. The valve was advanced through the introducer sheath using normal technique until in an appropriate position in the abdominal aorta beyond the sheath tip. The balloon was then retracted and using the fine-tuning wheel was centered on the valve. The valve was then advanced across the aortic arch using appropriate flexion of the catheter. The valve was carefully positioned across the aortic valve annulus. The catheter was retracted using normal technique. Once final position of the valve has been confirmed by angiographic assessment, the valve is deployed while temporarily holding ventilation and during rapid ventricular pacing to maintain systolic blood pressure < 50 mmHg and pulse pressure < 10 mmHg. The balloon inflation is held for >3 seconds after reaching full deployment volume. Once the balloon has fully deflated the balloon is retracted into the ascending aorta and valve function is assessed using TTE. There is felt to be no paravalvular leak and no central aortic insufficiency.  The patient's hemodynamic recovery following valve deployment is good.  The deployment balloon and guidewire are both removed. Echo demostrated acceptable post-procedural gradients, stable mitral valve function, and no AI.   PROCEDURE COMPLETION:  The sheath was then removed and closure devices were completed. Protamine was administered once femoral arterial repair was complete. The temporary pacemaker, pigtail catheters and femoral sheaths were removed with manual pressure used for hemostasis.   The patient tolerated the procedure well and is transported to the surgical intensive care in stable condition. There were no immediate intraoperative complications. All sponge instrument and needle counts are verified correct at completion of the operation.   No blood products were administered during the operation.  The patient received a total of 39.8 mL  of intravenous contrast during the procedure.  Lauree Chandler MD 12/22/2017 2:00 PM

## 2017-12-22 NOTE — Progress Notes (Signed)
Site: Left femoral Sheath Size: 6Fr - arterial and venous Condition prior to removal:  Level 0 Type of pressure held: manual Time pressure held: 82min. Status of patient during pull: stable Condition of site post pull:  Level 0 Type of dressing applied: gauze w/ transparent  Pulses verified: DP 1+  Patient's condition post pull: stable Bedrest begins at: 1440 Post instructions given to patient: yes  Sheaths pulled by Alison Murray, RT

## 2017-12-22 NOTE — Anesthesia Procedure Notes (Addendum)
Procedure Name: MAC Date/Time: 12/22/2017 12:45 PM Performed by: Moshe Salisbury, CRNA Pre-anesthesia Checklist: Patient identified, Emergency Drugs available, Suction available, Timeout performed and Patient being monitored Oxygen Delivery Method: Nasal cannula Dental Injury: Teeth and Oropharynx as per pre-operative assessment

## 2017-12-22 NOTE — Anesthesia Procedure Notes (Signed)
Arterial Line Insertion Start/End7/23/2019 11:40 AM, 12/22/2017 11:52 AM Performed by: Josephine Igo, CRNA, CRNA  Preanesthetic checklist: patient identified, IV checked, risks and benefits discussed, surgical consent and monitors and equipment checked Lidocaine 1% used for infiltration and patient sedated Right, radial was placed Catheter size: 20 G Hand hygiene performed , maximum sterile barriers used  and Seldinger technique used  Attempts: 1 Procedure performed without using ultrasound guided technique. Following insertion, dressing applied and Biopatch. Post procedure assessment: normal  Patient tolerated the procedure well with no immediate complications.

## 2017-12-22 NOTE — Progress Notes (Signed)
  [facility] VALVE TEAM  Patient doing well s/p TAVR. He is hemodynamically stable. Groin sites stable. ECG with no high grade block. Arterial line removed and transferred to 4E stepdown. Plan for early ambulation after bedrest completed.   Angelena Form PA-C  [facility]  Pager [phone]

## 2017-12-22 NOTE — Interval H&P Note (Signed)
History and Physical Interval Note:  12/22/2017 11:35 AM  Lawrence Marseilles  has presented today for surgery, with the diagnosis of Severe Aortic Stenosis  The various methods of treatment have been discussed with the patient and family. After consideration of risks, benefits and other options for treatment, the patient has consented to  Procedure(s): TRANSCATHETER AORTIC VALVE REPLACEMENT, TRANSFEMORAL (N/A) TRANSESOPHAGEAL ECHOCARDIOGRAM (TEE) (N/A) as a surgical intervention .  The patient's history has been reviewed, patient examined, no change in status, stable for surgery.  I have reviewed the patient's chart and labs.  Questions were answered to the patient's satisfaction.     Lauree Chandler

## 2017-12-22 NOTE — Progress Notes (Signed)
The flow of vital signs became unpaired at 1440 and resumed at 1515.  Pt's VS were WNL during this period.  I was at the bedside during this time.  Pt remained stable.

## 2017-12-22 NOTE — Transfer of Care (Signed)
Immediate Anesthesia Transfer of Care Note  Patient: Jesse Cordova  Procedure(s) Performed: TRANSCATHETER AORTIC VALVE REPLACEMENT, TRANSFEMORAL (N/A Chest) TRANSESOPHAGEAL ECHOCARDIOGRAM (TEE) (N/A )  Patient Location: Cath Lab  Anesthesia Type:MAC  Level of Consciousness: sedated  Airway & Oxygen Therapy: Patient Spontanous Breathing and Patient connected to nasal cannula oxygen  Post-op Assessment: Report given to RN and Post -op Vital signs reviewed and stable  Post vital signs: Reviewed and stable  Last Vitals:  Vitals Value Taken Time  BP 142/63 12/22/2017  2:25 PM  Temp    Pulse 65 12/22/2017  2:27 PM  Resp 11 12/22/2017  2:27 PM  SpO2 95 % 12/22/2017  2:27 PM  Vitals shown include unvalidated device data.  Last Pain:  Vitals:   12/22/17 1112  TempSrc: Oral         Complications: No apparent anesthesia complications

## 2017-12-22 NOTE — Anesthesia Postprocedure Evaluation (Signed)
Anesthesia Post Note  Patient: Jesse Cordova  Procedure(s) Performed: TRANSCATHETER AORTIC VALVE REPLACEMENT, TRANSFEMORAL (N/A Chest) TRANSESOPHAGEAL ECHOCARDIOGRAM (TEE) (N/A )     Patient location during evaluation: PACU Anesthesia Type: MAC Level of consciousness: awake and alert Pain management: pain level controlled Vital Signs Assessment: post-procedure vital signs reviewed and stable Respiratory status: spontaneous breathing, nonlabored ventilation, respiratory function stable and patient connected to nasal cannula oxygen Cardiovascular status: stable and blood pressure returned to baseline Postop Assessment: no apparent nausea or vomiting Anesthetic complications: no    Last Vitals:  Vitals:   12/22/17 1112 12/22/17 1342  BP: (!) 151/58   Pulse: 70 (!) 48  Resp: 20   Temp: 36.9 C   SpO2: 97%     Last Pain:  Vitals:   12/22/17 1112  TempSrc: Oral                 Tamla Winkels DAVID

## 2017-12-22 NOTE — Op Note (Addendum)
[facility]   MULTIDISCIPLINARY HEART VALVE TEAM   TAVR OPERATIVE NOTE   Date of Procedure:  12/22/2017  Preoperative Diagnosis: Severe Aortic Stenosis   Postoperative Diagnosis: Same   Procedure:    Transcatheter Aortic Valve Replacement - Percutaneous Right Transfemoral Approach  Edwards Sapien 3 THV (size 26 mm, model # 9600TFX, serial # [id number])   Co-Surgeons:  Lauree Chandler, MD and Valentina Gu. 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  He had a murmur on exam and an echocardiogram at that time showed moderate aortic stenosis with a mean gradient of 23 mmHg.  There was also moderate aortic insufficiency.  He was asymptomatic at that time he underwent his skin cancer surgery followed by radiation therapy.  He now reports that over the past few months he has developed progressive exertional fatigue and shortness of breath which has been occurring with walking on level ground out to his mailbox which is about 30 feet.  He says that just about any activity now is giving him some shortness of breath and gets his attention.  He denies any chest pain or pressure.  He has had no dizziness or syncope.  He denies orthopnea and PND.  He had an echo on 11/10/2017 that showed progression of his aortic valve stenosis to severe with a mean gradient of 42 mmHg.  There is moderate aortic insufficiency and  normal left ventricular systolic function.  During the course of the patient's preoperative work up they have been evaluated comprehensively by a multidisciplinary team of specialists coordinated through the [facility] the [facility].  They have been demonstrated to suffer from symptomatic severe aortic stenosis as noted above. The patient has been counseled extensively as to the relative risks and benefits of all options for the treatment of severe aortic stenosis including long term medical therapy, conventional surgery for aortic valve replacement, and transcatheter aortic valve replacement.  All questions have been answered, and the patient provides full informed consent for the operation as described.   DETAILS OF THE OPERATIVE PROCEDURE  PREPARATION:    The patient is brought to the operating room on the above mentioned date and central monitoring was established by the anesthesia team including placement of a central venous line and radial arterial line. The patient is placed in the supine position on the operating table.  Intravenous antibiotics are administered. The patient is monitored closely throughout the procedure under conscious sedation.  Baseline transthoracic echocardiogram was performed. The patient's chest, abdomen, both groins, and both lower extremities are prepared and draped in a sterile manner. A time out procedure is performed.   PERIPHERAL ACCESS:    Using the modified Seldinger technique, femoral arterial and venous access was obtained with placement of 6 Fr sheaths on the left side.  A pigtail diagnostic catheter was passed through the left arterial sheath under fluoroscopic guidance into the aortic root.  A temporary transvenous pacemaker  catheter was passed through the left femoral venous sheath under fluoroscopic guidance into the right ventricle.  The pacemaker was tested to ensure stable lead placement and  pacemaker capture. Aortic root angiography was performed in order to determine the optimal angiographic angle for valve deployment.   TRANSFEMORAL ACCESS:   Percutaneous transfemoral access and sheath placement was performed by Dr. Angelena Form using ultrasound guidance.  The right common femoral artery was cannulated using a micropuncture needle and appropriate location was verified using hand injection angiogram.  A pair of Abbott Perclose percutaneous closure devices were placed and a 6 French sheath replaced into the femoral artery.  The patient was heparinized systemically and ACT verified > 250 seconds.    A 14 Fr transfemoral E-sheath was introduced into the right common femoral artery after progressively dilating over an Amplatz superstiff wire. An AL-1 catheter was used to direct a straight-tip exchange length wire across the native aortic valve into the left ventricle. This was exchanged out for a pigtail catheter and position was confirmed in the LV apex. Simultaneous LV and Ao pressures were recorded.  The pigtail catheter was exchanged for an Amplatz Extra-stiff wire in the LV apex.  Echocardiography was utilized to confirm appropriate wire position and no sign of entanglement in the mitral subvalvular apparatus.   TRANSCATHETER HEART VALVE DEPLOYMENT:   An Edwards Sapien 3 transcatheter heart valve (size 26 mm, model #9600TFX, serial #[id number]) was prepared and crimped per manufacturer's guidelines, and the proper orientation of the valve is confirmed on the [employer] delivery system. The valve was advanced through the introducer sheath using normal technique until in an appropriate position in the abdominal aorta beyond the sheath tip. The balloon was then retracted and using the fine-tuning wheel was centered on the valve. The valve was then advanced across the aortic arch using appropriate flexion of the catheter. The valve was carefully positioned across the aortic valve annulus. The  [place] catheter was retracted using normal technique. Once final position of the valve has been confirmed by angiographic assessment, the valve is deployed while temporarily holding ventilation and during rapid ventricular pacing to maintain systolic blood pressure < 50 mmHg and pulse pressure < 10 mmHg. The balloon inflation is held for >3 seconds after reaching full deployment volume. Once the balloon has fully deflated the balloon is retracted into the ascending aorta and valve function is assessed using echocardiography. There is felt to be no paravalvular leak and no central aortic insufficiency.  The patient's hemodynamic recovery following valve deployment is good.  The deployment balloon and guidewire are both removed.    PROCEDURE COMPLETION:   The sheath was removed and femoral artery closure performed by Dr Angelena Form.  Protamine was administered once femoral arterial repair was complete. The temporary pacemaker, pigtail catheters and femoral sheaths were removed with manual pressure used for hemostasis.   The patient tolerated the procedure well and is transported to the surgical intensive care in stable condition. There were no immediate intraoperative complications. All sponge instrument and needle counts are verified correct at completion of the operation.   No blood products were administered during the operation.  The patient received a total of 39.8 mL of intravenous contrast during the procedure.   Rexene Alberts, MD 12/22/2017 2:13 PM

## 2017-12-22 NOTE — Anesthesia Preprocedure Evaluation (Addendum)
Anesthesia Evaluation  Patient identified by MRN, date of birth, ID band Patient awake    Reviewed: Allergy & Precautions, NPO status , Patient's Chart, lab work & pertinent test results  History of Anesthesia Complications (+) PONV  Airway Mallampati: I  TM Distance: >3 FB Neck ROM: Full    Dental   Pulmonary    Pulmonary exam normal        Cardiovascular hypertension, Pt. on medications Normal cardiovascular exam+ Valvular Problems/Murmurs AS      Neuro/Psych    GI/Hepatic GERD  Medicated and Controlled,  Endo/Other  diabetes, Type 2, Oral Hypoglycemic Agents  Renal/GU      Musculoskeletal   Abdominal   Peds  Hematology   Anesthesia Other Findings   Reproductive/Obstetrics                            Anesthesia Physical Anesthesia Plan  ASA: III  Anesthesia Plan: MAC   Post-op Pain Management:    Induction: Intravenous  PONV Risk Score and Plan: 2  Airway Management Planned: Simple Face Mask  Additional Equipment: Arterial line, CVP and Ultrasound Guidance Line Placement  Intra-op Plan:   Post-operative Plan:   Informed Consent: I have reviewed the patients History and Physical, chart, labs and discussed the procedure including the risks, benefits and alternatives for the proposed anesthesia with the patient or authorized representative who has indicated his/her understanding and acceptance.     Plan Discussed with: CRNA and Surgeon  Anesthesia Plan Comments:         Anesthesia Quick Evaluation

## 2017-12-22 NOTE — Progress Notes (Signed)
  Echocardiogram 2D Echocardiogram has been performed.  Jesse Cordova 12/22/2017, 3:14 PM

## 2017-12-23 ENCOUNTER — Encounter (HOSPITAL_COMMUNITY): Payer: Self-pay | Admitting: Cardiovascular Disease

## 2017-12-23 ENCOUNTER — Other Ambulatory Visit: Payer: Self-pay | Admitting: Physician Assistant

## 2017-12-23 ENCOUNTER — Inpatient Hospital Stay (HOSPITAL_COMMUNITY): Payer: Medicare Other

## 2017-12-23 DIAGNOSIS — I340 Nonrheumatic mitral (valve) insufficiency: Secondary | ICD-10-CM

## 2017-12-23 DIAGNOSIS — Z952 Presence of prosthetic heart valve: Secondary | ICD-10-CM

## 2017-12-23 DIAGNOSIS — I350 Nonrheumatic aortic (valve) stenosis: Secondary | ICD-10-CM

## 2017-12-23 LAB — ECHOCARDIOGRAM COMPLETE
HEIGHTINCHES: 70 in
WEIGHTICAEL: 2910.07 [oz_av]

## 2017-12-23 LAB — CBC
HCT: 46.8 % (ref 39.0–52.0)
Hemoglobin: 15.5 g/dL (ref 13.0–17.0)
MCH: 29.8 pg (ref 26.0–34.0)
MCHC: 33.1 g/dL (ref 30.0–36.0)
MCV: 89.8 fL (ref 78.0–100.0)
PLATELETS: 172 10*3/uL (ref 150–400)
RBC: 5.21 MIL/uL (ref 4.22–5.81)
RDW: 13.2 % (ref 11.5–15.5)
WBC: 10.9 10*3/uL — AB (ref 4.0–10.5)

## 2017-12-23 LAB — BASIC METABOLIC PANEL
ANION GAP: 11 (ref 5–15)
BUN: 16 mg/dL (ref 8–23)
CO2: 22 mmol/L (ref 22–32)
Calcium: 9.2 mg/dL (ref 8.9–10.3)
Chloride: 103 mmol/L (ref 98–111)
Creatinine, Ser: 1.18 mg/dL (ref 0.61–1.24)
GFR, EST NON AFRICAN AMERICAN: 57 mL/min — AB (ref >60)
Glucose, Bld: 137 mg/dL — ABNORMAL HIGH (ref 70–99)
POTASSIUM: 4 mmol/L (ref 3.5–5.1)
SODIUM: 136 mmol/L (ref 135–145)

## 2017-12-23 LAB — GLUCOSE, CAPILLARY
GLUCOSE-CAPILLARY: 142 mg/dL — AB (ref 70–99)
GLUCOSE-CAPILLARY: 197 mg/dL — AB (ref 70–99)
GLUCOSE-CAPILLARY: 204 mg/dL — AB (ref 70–99)
Glucose-Capillary: 180 mg/dL — ABNORMAL HIGH (ref 70–99)

## 2017-12-23 LAB — MAGNESIUM: Magnesium: 2 mg/dL (ref 1.7–2.4)

## 2017-12-23 MED ORDER — PANTOPRAZOLE SODIUM 40 MG PO TBEC: 40.0000 mg | DELAYED_RELEASE_TABLET | Freq: Every day | ORAL | 3 refills | Status: DC

## 2017-12-23 MED ORDER — CLOPIDOGREL BISULFATE 75 MG PO TABS: 75.0000 mg | ORAL_TABLET | Freq: Every day | ORAL | 1 refills | Status: DC

## 2017-12-23 NOTE — Progress Notes (Signed)
Patient central line removed as ordered and per protocol. Tip intact. Pressure held and dressing applied. Will continue to monitor patient. Homer Pfeifer, Bettina Gavia RN

## 2017-12-23 NOTE — Progress Notes (Addendum)
Patient complaining of being hot and sweaty after getting a bath and sitting in chair for a little while. Blood pressure sitting 94/55.  Oxygen 96 on room air. And heart rate 80 on monitor. Blood sugar checked and 197. Assisted patient to lie back down in bed and patient states he" isnt as sweaty now and feels better than when sitting in chair". BP 145/75 heart rate 80. Will continue to monitor patient. [place]ning [place], Bettina Gavia RN

## 2017-12-23 NOTE — Discharge Summary (Addendum)
[facility]   Discharge Summary    Patient ID: Jesse Cordova,  MRN: [id number], DOB/AGE: 1941/04/08 77 y.o.  Admit date: 12/22/2017 Discharge date: 12/23/2017  Primary Care Provider: Sallee Lange A Primary Cardiologist: Dr. Oval Linsey / Dr. Angelena Form & Dr. Burt Knack (TAVR)   Discharge Diagnoses    Principal Problem:   S/P TAVR (transcatheter aortic valve replacement) Active Problems:   DM type 2 (diabetes mellitus, type 2) ([place])   Essential hypertension, benign   Hyperlipidemia   Merkel cell carcinoma of face (HCC)   Severe aortic stenosis   Allergies Allergies  Allergen Reactions  . Cortisone Other (See Comments)    swelling  . Tetanus Toxoids Other (See Comments)    Swelling, not specified  . Hydrocodone-Homatropine Nausea And Vomiting     History of Present Illness     Jesse Cordova is a 77 y.o. male with a history of DMT2, HTN, HLD, and severe AS who presented to [facility] on 12/22/17 for planned TAVR.   He has a history of aortic stenosis that was diagnosed a couple years ago when he had aMerkel cellcarcinoma removed from the right side of his face at [facility]. He had a murmur on exam and an echocardiogram at that time showed moderate aortic stenosis with a mean gradient of 23 mmHg. There was also moderate aortic insufficiency. He was asymptomatic at that time he underwent his skin cancer surgery followed by radiation therapy. He now reports that over the past few months he has developed progressive exertional fatigue and shortness of breath. He had an echo on 11/10/2017 that showed progression of his aortic valve stenosis to severe with a mean gradient of 42 mmHg. There is moderate aortic insufficiency and normal left ventricular systolic function. 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   He was evaluated by the  [facility] and deemed to be a suitable candidate for TAVR,  which was set up for 12/22/17.   Hospital Course     Consultants: none  Severe AS: s/p successful TAVR with a 26 mm Edwards Sapien 3 THV via the TF approach on 12/22/17. Post operative echo pending today. Groin sites are stable. Continue ASA and plavix. Plan for DC home today with 1 week TOC follow up in the valve clinic.  HTN: BP has mildly elevated. Home antihypertensives will be resumed at discharge.   HLD: continue statin  DMT2: treated with SSI here. Continue home regimen at discharge.   GERD: prilosec was switched to protonix given potential interaction with plavix.   The patient has had an uncomplicated hospital course and is recovering well. The femoral catheter sites are stable. He has been seen by Dr. Angelena Form today and deemed ready for discharge home. All follow-up appointments have been scheduled. Discharge medications are listed below.  _____________  Discharge Vitals Blood pressure (!) 145/75, pulse 88, temperature 98.8 F (37.1 C), temperature source Oral, resp. rate 17, height _0  (1.778 m), weight 181 lb 14.1 oz (82.5 kg), SpO2 95 %.  Filed Weights   12/22/17 1112 12/23/17 0523  Weight: 182 lb (82.6 kg) 181 lb 14.1 oz (82.5 kg)   VS:  BP (!) 145/75   Pulse 88   Temp 98.8 F (37.1 C) (Oral)   Resp 17   Ht _1  (1.778 m)   Wt 181 lb 14.1 oz (82.5 kg)   SpO2 95%   BMI 26.10 kg/m  GEN: Well nourished, well developed, in no acute distress  HEENT: normal  Neck: no JVD, carotid bruits, or masses Cardiac: RRR; no murmurs, rubs, or gallops,no edema  Respiratory:  clear to auscultation bilaterally, normal work of breathing GI: soft, nontender, nondistended, + BS MS: no deformity or atrophy  Skin: warm and dry, no rash. Groin sites are stable.  Neuro:  Alert and Oriented x 3, Strength and sensation are intact Psych: euthymic mood, full affect   Labs & Radiologic Studies     CBC Recent Labs    12/22/17 1425 12/23/17 0512  WBC  --  10.9*  HGB 13.3  15.5  HCT 39.0 46.8  MCV  --  89.8  PLT  --  025   Basic Metabolic Panel Recent Labs    12/22/17 1425 12/23/17 0512  NA 139 136  K 4.2 4.0  CL 104 103  CO2  --  22  GLUCOSE 142* 137*  BUN 20 16  CREATININE 1.00 1.18  CALCIUM  --  9.2  MG  --  2.0   Liver Function Tests No results for input(s): AST, ALT, ALKPHOS, BILITOT, PROT, ALBUMIN in the last 72 hours. No results for input(s): LIPASE, AMYLASE in the last 72 hours. Cardiac Enzymes No results for input(s): CKTOTAL, CKMB, CKMBINDEX, TROPONINI in the last 72 hours. BNP Invalid input(s): POCBNP D-Dimer No results for input(s): DDIMER in the last 72 hours. Hemoglobin A1C No results for input(s): HGBA1C in the last 72 hours. Fasting Lipid Panel No results for input(s): CHOL, HDL, LDLCALC, TRIG, CHOLHDL, LDLDIRECT in the last 72 hours. Thyroid Function Tests No results for input(s): TSH, T4TOTAL, T3FREE, THYROIDAB in the last 72 hours.  Invalid input(s): FREET3  Dg Chest 2 View  Result Date: 12/18/2017 CLINICAL DATA:  77 year old male with preoperative chest x-ray for TAVR EXAM: CHEST - 2 VIEW COMPARISON:  CT 12/08/2017, chest x-ray 05/12/2016 FINDINGS: Cardiomediastinal silhouette unchanged in size and contour. Calcifications of the aortic arch. No pneumothorax or pleural effusion. No confluent airspace disease. Similar appearance of chronic changes. No acute displaced fracture. IMPRESSION: Negative for acute cardiopulmonary disease Electronically Signed   By: Corrie Mckusick D.O.   On: 12/18/2017 17:23   Ct Coronary Morph W/cta Cor W/score W/ca W/cm &/or Wo/cm  Addendum Date: 12/08/2017   ADDENDUM REPORT: 12/08/2017 19:37 CLINICAL DATA:  77 year old male with severe aortic stenosis being evaluated for a TAVR procedure. EXAM: Cardiac TAVR CT TECHNIQUE: The patient was scanned on a [employer]. A 120 kV retrospective scan was triggered in the descending thoracic aorta at 111 HU's. Gantry rotation speed was 250 msecs  and collimation was .6 mm. No beta blockade or nitro were given. The 3D data set was reconstructed in 5% intervals of the R-R cycle. Systolic and diastolic phases were analyzed on a dedicated work station using MPR, MIP and VRT modes. The patient received 80 cc of contrast. FINDINGS: Aortic Valve: Trileaflet aortic valve with severely calcified and thickened leaflets and moderately decreased leaflets excursions there are no calcifications extending into the LVOT. Aorta: Normal size, no dissection, mild calcifications and atheroma in the ascending aorta and moderate calcifications and atheroma in the aortic arch and descending thoracic aorta. Sinotubular Junction: 29 x 27 mm Ascending Thoracic Aorta: 33 x 33 mm Aortic Arch: 28 x 27 mm Descending Thoracic Aorta: 24 x 22 mm Sinus of Valsalva Measurements: Non-coronary: 30 mm Right -coronary: 31 mm Left -coronary: 30 mm Coronary Artery Height above Annulus: Left Main: 16 mm Right  Coronary: 16 mm Virtual Basal Annulus Measurements: Maximum/Minimum Diameter: 27.6 x 23.2 mm Mean Diameter: 24.5 mm Perimeter: 78.8 mm Area: 472 mm2 Optimum Fluoroscopic Angle for Delivery: LAO 9 CAU 8 IMPRESSION: 1. Trileaflet aortic valve with severely calcified and thickened leaflets and moderately decreased leaflets excursions there are no calcifications extending into the LVOT. Annular measurements suitable for delivery of a 26 mm Edwards-SAPIEN 3 valve. 2. Sufficient coronary to annulus distance. 3. Optimum Fluoroscopic Angle for Delivery:  LAO 9 CAU 8. 4. No thrombus in the left atrial appendage. Electronically Signed   By: Ena Dawley   On: 12/08/2017 19:37   Result Date: 12/08/2017 EXAM: OVER-READ INTERPRETATION  CT CHEST The following report is an over-read performed by radiologist Dr. Vinnie Langton of [facility], [place] on 12/08/2017. This over-read does not include interpretation of cardiac or coronary anatomy or pathology. The coronary calcium score/coronary CTA  interpretation by the cardiologist is attached. COMPARISON:  Chest CT 11/10/2011. FINDINGS: Extracardiac findings will be described separately under dictation for contemporaneously obtained CTA chest, abdomen and pelvis. IMPRESSION: Please see separate dictation for contemporaneously obtained CTA chest, abdomen and pelvis dated 12/08/2017 for full description of relevant extracardiac findings. Electronically Signed: By: Vinnie Langton M.D. On: 12/08/2017 15:38   Dg Chest Port 1 View  Result Date: 12/22/2017 CLINICAL DATA:  Status post TAVR EXAM: PORTABLE CHEST 1 VIEW COMPARISON:  12/18/2017 FINDINGS: Cardiac shadow is stable. Right jugular central line is noted in satisfactory position. Aortic calcifications are seen. Trans aortic valve replacement is noted. The lungs are well aerated bilaterally without focal infiltrate or sizable effusion. No bony abnormality is noted. IMPRESSION: No acute abnormality noted. Electronically Signed   By: Inez Catalina M.D.   On: 12/22/2017 17:21   Ct Angio Chest Aorta W &/or Wo Contrast  Result Date: 12/08/2017 CLINICAL DATA:  77 year old male with history of severe aortic stenosis. Preprocedural study prior to potential transcatheter aortic valve replacement (TAVR). EXAM: CT CHEST, ABDOMEN AND PELVIS WITHOUT CONTRAST TECHNIQUE: Multidetector CT imaging of the chest, abdomen and pelvis was performed following the standard protocol without IV contrast. COMPARISON:  Chest CT 11/10/2011. CT the abdomen and pelvis 11/19/2011. FINDINGS: CTA CHEST FINDINGS Cardiovascular: Heart size is normal. There is no significant pericardial fluid, thickening or pericardial calcification. There is aortic atherosclerosis, as well as atherosclerosis of the great vessels of the mediastinum and the coronary arteries, including calcified atherosclerotic plaque in the left main, left anterior descending and right coronary arteries. Thickening calcification of the aortic valve. Mild mitral annular  calcification. Mediastinum/Lymph Nodes: No pathologically enlarged mediastinal or hilar lymph nodes. Esophagus is unremarkable in appearance. No axillary lymphadenopathy. Lungs/Pleura: 5 x 3 mm nodule (mean diameter 4 mm) in the anterior aspect of the right lower lobe abutting the major fissure (axial image 40 of series 15), unchanged dating back to 2013, considered definitively benign, presumably a subpleural lymph node. No other suspicious appearing pulmonary nodules or masses are noted. No acute consolidative airspace disease. No pleural effusions. Musculoskeletal/Soft Tissues: There are no aggressive appearing lytic or blastic lesions noted in the visualized portions of the skeleton. CTA ABDOMEN AND PELVIS FINDINGS Hepatobiliary: No suspicious cystic or solid hepatic lesions. No intra or extrahepatic biliary ductal dilatation. Gallbladder is normal in appearance. Pancreas: No pancreatic mass. No pancreatic ductal dilatation. No pancreatic or peripancreatic fluid or inflammatory changes. Spleen: Unremarkable. Adrenals/Urinary Tract: Bilateral kidneys and bilateral adrenal glands are normal in appearance. No hydronephrosis. There is mild left hydroureter. No definite calculus identified along  the course of the left ureter. Urinary bladder is normal in appearance. Stomach/Bowel: Normal appearance of the stomach. No pathologic dilatation of small bowel or colon. Normal appendix. Vascular/Lymphatic: Aortic atherosclerosis with vascular findings and measurements pertinent to potential TAVR procedure, as detailed below. No aneurysm or dissection noted in the abdominal or pelvic vasculature. Celiac axis, superior mesenteric artery and inferior mesenteric artery are all widely patent without without hemodynamically significant stenosis. Two right and single (with early bifurcation) renal arteries are all widely patent without hemodynamically significant stenosis. No lymphadenopathy noted in the abdomen or pelvis.  Reproductive: Prostate gland and seminal vesicles are unremarkable in appearance. Other: No significant volume of ascites.  No pneumoperitoneum. Musculoskeletal: There are no aggressive appearing lytic or blastic lesions noted in the visualized portions of the skeleton. VASCULAR MEASUREMENTS PERTINENT TO TAVR: AORTA: Minimal Aortic Diameter-15 x 15 mm Severity of Aortic Calcification-mild RIGHT PELVIS: Right Common Iliac Artery - Minimal Diameter-11.4 x 10.5 mm Tortuosity-mild-to-moderate Calcification-none Right External Iliac Artery - Minimal Diameter-8.6 x 9.3 mm Tortuosity-moderate Calcification - none Right Common Femoral Artery - Minimal Diameter-9.5 x 8.5 mm Tortuosity-mild Calcification - none LEFT PELVIS: Left Common Iliac Artery - Minimal Diameter-10.8 x 10.6 mm Tortuosity-moderate Calcification - none Left External Iliac Artery - Minimal Diameter-8.2 x 8.5 mm Tortuosity-moderate to severe Calcification - none Left Common Femoral Artery - Minimal Diameter-9.0 x 8.9 mm Tortuosity-mild Calcification - none Review of the MIP images confirms the above findings. IMPRESSION: 1. Vascular findings and measurements pertinent to potential TAVR procedure, as detailed above. 2. Severe thickening calcification of the aortic valve, compatible with the reported clinical history of severe aortic stenosis. 3. Aortic atherosclerosis, in addition to left main and 2 vessel coronary artery disease. Please note that although the presence of coronary artery calcium documents the presence of coronary artery disease, the severity of this disease and any potential stenosis cannot be assessed on this non-gated CT examination. Assessment for potential risk factor modification, dietary therapy or pharmacologic therapy may be warranted, if clinically indicated. 4. Additional incidental findings, as above. Aortic Atherosclerosis (ICD10-I70.0). Electronically Signed   By: Vinnie Langton M.D.   On: 12/08/2017 17:09   Ct Angio Abd/pel  W/ And/or W/o  Result Date: 12/08/2017 CLINICAL DATA:  77 year old male with history of severe aortic stenosis. Preprocedural study prior to potential transcatheter aortic valve replacement (TAVR). EXAM: CT CHEST, ABDOMEN AND PELVIS WITHOUT CONTRAST TECHNIQUE: Multidetector CT imaging of the chest, abdomen and pelvis was performed following the standard protocol without IV contrast. COMPARISON:  Chest CT 11/10/2011. CT the abdomen and pelvis 11/19/2011. FINDINGS: CTA CHEST FINDINGS Cardiovascular: Heart size is normal. There is no significant pericardial fluid, thickening or pericardial calcification. There is aortic atherosclerosis, as well as atherosclerosis of the great vessels of the mediastinum and the coronary arteries, including calcified atherosclerotic plaque in the left main, left anterior descending and right coronary arteries. Thickening calcification of the aortic valve. Mild mitral annular calcification. Mediastinum/Lymph Nodes: No pathologically enlarged mediastinal or hilar lymph nodes. Esophagus is unremarkable in appearance. No axillary lymphadenopathy. Lungs/Pleura: 5 x 3 mm nodule (mean diameter 4 mm) in the anterior aspect of the right lower lobe abutting the major fissure (axial image 40 of series 15), unchanged dating back to 2013, considered definitively benign, presumably a subpleural lymph node. No other suspicious appearing pulmonary nodules or masses are noted. No acute consolidative airspace disease. No pleural effusions. Musculoskeletal/Soft Tissues: There are no aggressive appearing lytic or blastic lesions noted in the visualized portions of the  skeleton. CTA ABDOMEN AND PELVIS FINDINGS Hepatobiliary: No suspicious cystic or solid hepatic lesions. No intra or extrahepatic biliary ductal dilatation. Gallbladder is normal in appearance. Pancreas: No pancreatic mass. No pancreatic ductal dilatation. No pancreatic or peripancreatic fluid or inflammatory changes. Spleen: Unremarkable.  Adrenals/Urinary Tract: Bilateral kidneys and bilateral adrenal glands are normal in appearance. No hydronephrosis. There is mild left hydroureter. No definite calculus identified along the course of the left ureter. Urinary bladder is normal in appearance. Stomach/Bowel: Normal appearance of the stomach. No pathologic dilatation of small bowel or colon. Normal appendix. Vascular/Lymphatic: Aortic atherosclerosis with vascular findings and measurements pertinent to potential TAVR procedure, as detailed below. No aneurysm or dissection noted in the abdominal or pelvic vasculature. Celiac axis, superior mesenteric artery and inferior mesenteric artery are all widely patent without without hemodynamically significant stenosis. Two right and single (with early bifurcation) renal arteries are all widely patent without hemodynamically significant stenosis. No lymphadenopathy noted in the abdomen or pelvis. Reproductive: Prostate gland and seminal vesicles are unremarkable in appearance. Other: No significant volume of ascites.  No pneumoperitoneum. Musculoskeletal: There are no aggressive appearing lytic or blastic lesions noted in the visualized portions of the skeleton. VASCULAR MEASUREMENTS PERTINENT TO TAVR: AORTA: Minimal Aortic Diameter-15 x 15 mm Severity of Aortic Calcification-mild RIGHT PELVIS: Right Common Iliac Artery - Minimal Diameter-11.4 x 10.5 mm Tortuosity-mild-to-moderate Calcification-none Right External Iliac Artery - Minimal Diameter-8.6 x 9.3 mm Tortuosity-moderate Calcification - none Right Common Femoral Artery - Minimal Diameter-9.5 x 8.5 mm Tortuosity-mild Calcification - none LEFT PELVIS: Left Common Iliac Artery - Minimal Diameter-10.8 x 10.6 mm Tortuosity-moderate Calcification - none Left External Iliac Artery - Minimal Diameter-8.2 x 8.5 mm Tortuosity-moderate to severe Calcification - none Left Common Femoral Artery - Minimal Diameter-9.0 x 8.9 mm Tortuosity-mild Calcification - none  Review of the MIP images confirms the above findings. IMPRESSION: 1. Vascular findings and measurements pertinent to potential TAVR procedure, as detailed above. 2. Severe thickening calcification of the aortic valve, compatible with the reported clinical history of severe aortic stenosis. 3. Aortic atherosclerosis, in addition to left main and 2 vessel coronary artery disease. Please note that although the presence of coronary artery calcium documents the presence of coronary artery disease, the severity of this disease and any potential stenosis cannot be assessed on this non-gated CT examination. Assessment for potential risk factor modification, dietary therapy or pharmacologic therapy may be warranted, if clinically indicated. 4. Additional incidental findings, as above. Aortic Atherosclerosis (ICD10-I70.0). Electronically Signed   By: Vinnie Langton M.D.   On: 12/08/2017 17:09     Diagnostic Studies/Procedures    TAVR OPERATIVE NOTE   Date of Procedure:                12/22/2017  Preoperative Diagnosis:      Severe Aortic Stenosis  Procedure:        Transcatheter Aortic Valve Replacement - Percutaneous Right Transfemoral Approach             Edwards Sapien 3 THV (size 26 mm, model # 9600TFX, serial # [id number])              Co-Surgeons:                        Lauree Chandler, MD and Valentina Gu. Roxy Manns, MD   Pre-operative Echo Findings: ? Severe aortic stenosis ? Moderate aortic insufficiency ? Normal left ventricular systolic function  Post-operative Echo Findings: ? No paravalvular leak ? Normal left  ventricular systolic function   _____________  Post operative echo (12/23/17): formal read pending at the time of DC   Disposition   Pt is being discharged home today in good condition.  Follow-up Plans & Appointments    Follow-up Information    Eileen Stanford, PA-C. Go on 12/30/2017.   Specialties:  Cardiology, Radiology Why:  @ 3:30pm Contact  information: [place] [id number] [phone]            Discharge Medications     Medication List    STOP taking these medications   ibuprofen 200 MG tablet Commonly known as:  ADVIL,MOTRIN   omeprazole 20 MG capsule Commonly known as:  PRILOSEC Replaced by:  pantoprazole 40 MG tablet     TAKE these medications   ACCU-CHEK AVIVA PLUS test strip Generic drug:  glucose blood USE TO TEST SUGAR TWICE DAILY   ACCU-CHEK AVIVA PLUS test strip Generic drug:  glucose blood USE TO TEST SUGAR TWICE DAILY   aspirin EC 81 MG tablet Take 81 mg by mouth daily.   benzonatate 200 MG capsule Commonly known as:  TESSALON One three times daily as needed What changed:    how much to take  how to take this  when to take this  reasons to take this  additional instructions   blood glucose meter kit and supplies Kit Dispense based on patient and insurance preference. Use to test glucose twice daily. (For ICD 10 code E11.9)   cetirizine 10 MG tablet Commonly known as:  ZYRTEC Take 10 mg by mouth daily.   clopidogrel 75 MG tablet Commonly known as:  PLAVIX Take 1 tablet (75 mg total) by mouth daily with breakfast. Start taking on:  12/24/2017   diphenoxylate-atropine 2.5-0.025 MG tablet Commonly known as:  LOMOTIL TAKE 1 TABLET BY MOUTH FOUR TIMES DAILY AS NEEDED FOR DIARRHEA OR LOOSE STOOLS   empagliflozin 25 MG Tabs tablet Commonly known as:  JARDIANCE Take 25 mg by mouth daily.   glipiZIDE 5 MG tablet Commonly known as:  GLUCOTROL TAKE 1/2 TABLET BY MOUTH TWICE DAILY BEFORE A MEAL   Insulin Glargine 100 UNIT/ML Solostar Pen Commonly known as:  LANTUS SOLOSTAR INJECT UP TO 30 UNITS INTO SKIN EVERY NIGHT AT BEDTIME AS DIRECTED BY DOCTOR   Insulin Pen Needle 31G X 8 MM Misc Commonly known as:  B-D ULTRAFINE III SHORT PEN Use once daily   lisinopril 2.5 MG tablet Commonly known as:  PRINIVIL,ZESTRIL Take 1 tablet (2.5 mg total)  by mouth daily.   lovastatin 20 MG tablet Commonly known as:  MEVACOR TAKE 1 TABLET(20 MG) BY MOUTH AT BEDTIME   metoprolol succinate 25 MG 24 hr tablet Commonly known as:  TOPROL-XL Take 1 tablet (25 mg total) by mouth daily.   ondansetron 4 MG tablet Commonly known as:  ZOFRAN Take 4 mg by mouth every 8 (eight) hours as needed for nausea or vomiting.   pantoprazole 40 MG tablet Commonly known as:  PROTONIX Take 1 tablet (40 mg total) by mouth daily. Start taking on:  12/24/2017 Replaces:  omeprazole 20 MG capsule   ranitidine 150 MG tablet Commonly known as:  ZANTAC Take 150 mg by mouth daily as needed for heartburn.   silver sulfADIAZINE 1 % cream Commonly known as:  SILVADENE Apply 1 application topically daily as needed (skin irritation).   triamcinolone cream 0.1 % Commonly known as:  KENALOG Apply 1 application topically 2 (two) times daily as  needed.        Outstanding Labs/Studies   none  Duration of Discharge Encounter   Greater than 30 minutes including physician time.  Signed, Angelena Form PA-C 12/23/2017, 10:09 AM   I have personally seen and examined this patient. I agree with the assessment and plan as outlined above.  He is now one day post TAVR with a 26 mm Edwards Sapien 3 bioprosthetic AVR from the transfemoral approach. No complications. Rhythm stable this am in sinus. BP stable. Mild nausea this am. BP dropped and felt to be vagal mediated. Echo shows normal Lv systolic function and normally functioning AVR.  Will continue ASA and Plavix.  Discharge home later today if stable.  Follow up one week in valve clinic  [facility] 12/23/2017 10:22 AM

## 2017-12-23 NOTE — Progress Notes (Signed)
Patient and family given discharge instructions, medication list and personal prescriptions sent to pharmacy. Reviewed follow up appointments and patient and family verbalized understanding. IV and tele were dcd. Will discharge home as ordered. Transported to exit via wheel chair and nursing staff. Kathlynn Swofford, Bettina Gavia RN

## 2017-12-23 NOTE — Progress Notes (Signed)
CARDIAC REHAB PHASE I   PRE:  Rate/Rhythm: 82 SR  BP:  Sitting: 141/118      SaO2: 98 RA  MODE:  Ambulation: 470 ft   POST:  Rate/Rhythm: 90 SR  BP:  Sitting: 139/62    SaO2: 97 RA   Pt ambulated 470 ft in hallway standby assist. No c/o CP or SOB. Pt states he feels better than pre-op already. Notices increased endurance. Pt educated on need to shower daily and monitor incision sites. Reviewed restrictions and exercise guidelines. Will send referral for CRP II to [place].   [place], RN BSN 12/23/2017 2:39 PM

## 2017-12-23 NOTE — Discharge Instructions (Signed)
Your reflux medication, prilosec, was switched to protonix given potential interaction with plavix. This has been called into your pharmacy.  ACTIVITY AND EXERCISE  Daily activity and exercise are an important part of your recovery. People recover at different rates depending on their general health and type of valve procedure.  Most people recovering from TAVR feel better relatively quickly   No lifting, pushing, pulling more than 10 pounds (examples to avoid: groceries, vacuuming, gardening, golfing):             - For one week with a procedure through the groin.             - For six weeks for procedures through the chest wall.             - For three months for procedures through the breast-bone. NOTE: You will typically see one of our providers 7-10 days after your procedure to discuss [place] the above activities.      DRIVING  Do not drive for until you are seen for follow up and cleared by a provider.  If you have been told by your doctor in the past that you may not drive, you must talk with him/her before you begin driving again.     DRESSING  Groin site: you may leave the clear dressing over the site for up to one week or until it falls off.     HYGIENE  If you had a femoral (leg) procedure, you may take a shower when you return home. After the shower, pat the site dry. Do NOT use powder, oils or lotions in your groin area until the site has completely healed.  If you had a chest procedure, you may shower when you return home unless specifically instructed not to by your discharging practitioner.             - DO NOT scrub incision; pat dry with a towel             - DO NOT apply any lotions, oils, powders to the incision             - No tub baths / swimming for at least 2 weeks.  If you notice any fevers, chills, increased pain, swelling, bleeding or pus, please contact your doctor.   ADDITIONAL INFORMATION  If you are going to have an upcoming dental  procedure, please contact our office as you will require antibiotics ahead of time to prevent infection on your heart valve.       After TAVR Checklist  Check  Test Description   Follow up appointment in 1-2 weeks  You will see our structural heart physician assistant, Nell Range. Your incision sites will be checked and you will be cleared to drive and resume all normal activities if you are doing well.     1 month echo and follow up  You will have an echo to check on your new heart valve and be seen back in the office by Nell Range. Many times the echo is not read by your appointment time, but Joellen Jersey will call you later that day or the following day to report your results.   Follow up with your primary cardiologist You will need to be seen by your primary cardiologist in the following 3-6 months after your 1 month appointment in the valve clinic. Often times your Plavix or Aspirin will be discontinued during this time, but this is decided on a case by case  basis.    1 year echo and follow up You will have another echo to check on your heart valve after 1 year and be seen back in the office by Nell Range. This your last structural heart visit.   Bacterial endocarditis prophylaxis  You will have to take antibiotics for the rest of your life before all dental procedures (even teeth cleanings) to protect your heart valve. Antibiotics are also required before some surgeries. Please check with your cardiologist before scheduling any surgeries. Also, please make sure to tell us if you have a penicillin allergy as you will require an alternative antibiotic.

## 2017-12-23 NOTE — Progress Notes (Signed)
  Echocardiogram 2D Echocardiogram has been performed.  Jannett Celestine 12/23/2017, 9:56 AM

## 2017-12-23 NOTE — Progress Notes (Addendum)
Patient sitting in chair. States "he feels pretty good" family member will be able to pick him up around 530 pm after work. BP 129/65 heart rate 74 on monitor NSR. B groins without bleeding or hematoma,  Will monitor patient. Maxfield Gildersleeve, Bettina Gavia rN

## 2017-12-23 NOTE — Progress Notes (Signed)
Patient without complaints ambulated to bathroom and to chair. Sitting bp 140/62 heart rate 83 oxygen sat 9% on room air will monitor patient. Nyx Keady, Bettina Gavia RN

## 2017-12-23 NOTE — Progress Notes (Signed)
Nell Range [facility] made aware of patient status, stated OK to give Lisinopril this AM  will continue to monitor patient. Meghin Thivierge, Bettina Gavia RN

## 2017-12-24 ENCOUNTER — Telehealth: Payer: Self-pay

## 2017-12-24 LAB — POCT I-STAT, CHEM 8
BUN: 21 mg/dL (ref 8–23)
CALCIUM ION: 1.3 mmol/L (ref 1.15–1.40)
CREATININE: 1.1 mg/dL (ref 0.61–1.24)
Chloride: 104 mmol/L (ref 98–111)
Glucose, Bld: 141 mg/dL — ABNORMAL HIGH (ref 70–99)
HCT: 41 % (ref 39.0–52.0)
Hemoglobin: 13.9 g/dL (ref 13.0–17.0)
POTASSIUM: 4.3 mmol/L (ref 3.5–5.1)
Sodium: 140 mmol/L (ref 135–145)
TCO2: 24 mmol/L (ref 22–32)

## 2017-12-24 MED FILL — Potassium Chloride Inj 2 mEq/ML: INTRAVENOUS | Qty: 40 | Status: AC

## 2017-12-24 MED FILL — Heparin Sodium (Porcine) Inj 1000 Unit/ML: INTRAMUSCULAR | Qty: 30 | Status: AC

## 2017-12-24 MED FILL — Magnesium Sulfate Inj 50%: INTRAMUSCULAR | Qty: 10 | Status: AC

## 2017-12-24 NOTE — Telephone Encounter (Signed)
Patient contacted regarding discharge from [facility] on 12/23/2017, s/p TAVR 12/22/2017.  Patient understands to follow up with provider Angelena Form PA-C on 12/30/2017 at 3:30 PM at [facility] office. Patient understands discharge instructions? yes Patient understands medications and regiment? yes Patient understands to bring all medications to this visit? yes  The pt is feeling well and is currently sitting on back porch in rocking chair. The pt was advised to contact the office with any additional questions or concerns.

## 2017-12-25 ENCOUNTER — Telehealth: Payer: Self-pay | Admitting: Physician Assistant

## 2017-12-25 NOTE — Telephone Encounter (Signed)
  [facility]   MULTIDISCIPLINARY HEART VALVE TEAM  He had another episode of feeling hot and sweaty, nauseated and dizzy. No syncope. It lasted only a few minutes. BP was 129/70 and HR 70 and BG 119. Sounds like a vasovagal episode. To be safe I will have him hold his Toprol XL and keep a log of his HR and BP until I see him back next week.  Angelena Form PA-C  [facility]

## 2017-12-28 NOTE — Progress Notes (Signed)
[facility] [place]                                       Cardiology Office Note    Date:  12/31/2017   ID:  Jesse Cordova, DOB Nov 22, 1940, MRN [id number]  PCP:  [name], MD  Cardiologist: Dr. Oval Linsey / Dr. Angelena Form (TAVR)  CC: [facility] s/p TAVR  History of Present Illness:  Jesse Cordova is a 77 y.o. 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 He had a murmur on exam and an echocardiogram at that time showed moderate aortic stenosis with a mean gradient of 23 mmHg. There was also moderate aortic insufficiency. He was asymptomatic at that time he underwent his skin cancer surgery followed by radiation therapy. He now reports that over the past few months he has developed progressive exertional fatigue and shortness of breath. He had an echo on 11/10/2017 that showed progression of his aortic valve stenosis to severe with a mean gradient of 42 mmHg. There is moderate aortic insufficiency and normal left ventricular systolic function. 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   He underwent successful TAVR with a 26 mm Edwards Sapien 3 THV via the TF approach on 12/22/17. Post operative echo showed EF 60%, normally functioning TAVR with mean gradient 9 mm Hg. He had a vagal episode in the hospital and once after discharge and Toprol XL was discontinued.   Today he presents to clinic for follow up. He has had an occasional "pulling" in his chest when getting up and walking around. It is associated with shortness of breath and diaphoresis. It sometimes lasts for minutes and sometimes an hour. He rates his pain as a 1. He also feels like he is hot and sweaty but no more episodes of pre syncope. He brought  in a log of his BPs and HRs which showed BP 120/60--> 148/89 and HR 70-90 bpm. No LE edema, orthopnea or PND.    Past Medical History:  Diagnosis Date  . 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 Arthritis   . Cancer (Jesse Cordova)    skin cancer  . Chronic low back pain   . Complication of anesthesia   . Diabetes mellitus   . GERD (gastroesophageal reflux disease)   . Hyperlipemia   . Hypertension   . PONV (postoperative nausea and vomiting)   . 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 BIOPSY  01/16/2012   Procedure: BIOPSY;  Surgeon: Jesse Houston, MD;  [facility];  Service: Endoscopy;  Laterality: N/A;  . CATARACT EXTRACTION Right 7/06  . CATARACT EXTRACTION W/PHACO Left 01/05/2014   Procedure: CATARACT EXTRACTION PHACO AND INTRAOCULAR LENS PLACEMENT (IOC);  Surgeon: Jesse Branch, MD;  Location: [facility] ORS;  Service: Ophthalmology;  Laterality: Left;  CDE:15.98  . COLONOSCOPY  01/29/12   abnormal repeat in one year  . ESOPHAGOGASTRODUODENOSCOPY    . KNEE ARTHROSCOPY  2 right knee surgeries and 1 left knee surgery  . RIGHT/LEFT HEART CATH AND CORONARY ANGIOGRAPHY N/A 11/25/2017   Procedure: RIGHT/LEFT HEART CATH AND CORONARY ANGIOGRAPHY;  Surgeon: Jesse Blanks, MD;  Location: [facility] CV [facility];  Service: Cardiovascular;  Laterality: N/A;  . TEE WITHOUT CARDIOVERSION N/A 12/22/2017   Procedure: TRANSESOPHAGEAL ECHOCARDIOGRAM (TEE);  Surgeon: Jesse Blanks, MD;  Location: [place];  Service: Open Heart Surgery;  Laterality: N/A;  . TONSILLECTOMY    . TRANSCATHETER AORTIC VALVE REPLACEMENT, TRANSFEMORAL N/A 12/22/2017   Procedure: TRANSCATHETER AORTIC VALVE REPLACEMENT, TRANSFEMORAL;  Surgeon: Jesse Blanks, MD;  Location: [place];  Service: Open Heart Surgery;  Laterality: N/A;  .  VASECTOMY      Current Medications: Outpatient Medications Prior to Visit  Medication Sig Dispense Refill  . ACCU-CHEK AVIVA PLUS test strip USE TO TEST SUGAR TWICE DAILY 150 each 1  . ACCU-CHEK AVIVA PLUS test strip USE TO TEST SUGAR TWICE DAILY 100 each 5  . aspirin EC 81 MG tablet Take 81 mg by mouth daily.    . benzonatate (TESSALON) 200 MG capsule One three times daily as needed (Patient taking differently: Take 200 mg by mouth 3 (three) times daily as needed for cough. One three times daily as needed) 30 capsule 12  . blood glucose meter kit and supplies KIT Dispense based on patient and insurance preference. Use to test glucose twice daily. (For ICD 10 code E11.9) 1 each 5  . cetirizine (ZYRTEC) 10 MG tablet Take 10 mg by mouth daily.    . clopidogrel (PLAVIX) 75 MG tablet Take 1 tablet (75 mg total) by mouth daily with breakfast. 90 tablet 1  . diphenoxylate-atropine (LOMOTIL) 2.5-0.025 MG tablet TAKE 1 TABLET BY MOUTH FOUR TIMES DAILY AS NEEDED FOR DIARRHEA OR LOOSE STOOLS 30 tablet 3  . empagliflozin (JARDIANCE) 25 MG TABS tablet Take 25 mg by mouth daily. 30 tablet 5  . glipiZIDE (GLUCOTROL) 5 MG tablet TAKE 1/2 TABLET BY MOUTH TWICE DAILY BEFORE A MEAL 90 tablet 1  . Insulin Glargine (LANTUS SOLOSTAR) 100 UNIT/ML Solostar Pen INJECT UP TO 30 UNITS INTO SKIN EVERY NIGHT AT BEDTIME AS DIRECTED BY DOCTOR 30 mL 3  . Insulin Pen Needle (B-D ULTRAFINE III SHORT PEN) 31G X 8 MM MISC Use once daily 300 each 0  . lisinopril (PRINIVIL,ZESTRIL) 2.5 MG tablet Take 1 tablet (2.5 mg total) by mouth daily. 30 tablet 5  . lovastatin (MEVACOR) 20 MG tablet TAKE 1 TABLET(20 MG) BY MOUTH AT BEDTIME 90 tablet 1  . ondansetron (ZOFRAN) 4 MG tablet Take 4 mg by mouth every 8 (eight) hours as needed for nausea or vomiting.    . pantoprazole (PROTONIX) 40 MG tablet Take 1 tablet (40 mg total) by mouth daily. 90 tablet 3  . silver sulfADIAZINE (SILVADENE) 1 % cream Apply 1 application topically daily as  needed (skin irritation).    . triamcinolone cream (KENALOG) 0.1 % Apply 1 application topically 2 (two) times daily as needed. 45 g 4  . metoprolol succinate (TOPROL-XL) 25 MG 24 hr tablet Take 1 tablet (25 mg total) by mouth daily. (Patient not taking: Reported on 12/30/2017) 30 tablet 5  . ranitidine (ZANTAC) 150 MG tablet Take 150 mg by mouth daily as needed for heartburn.     No facility-administered medications prior to visit.      Allergies:   Cortisone; Tetanus toxoids; and Hydrocodone-homatropine   Social History   Socioeconomic History  . Marital status:  Married    Spouse name: Not on file  . Number of children: Not on file  . Years of education: Not on file  . Highest education level: Not on file  Occupational History  . Not on file  Social Needs  . Financial resource strain: Not on file  . Food insecurity:    Worry: Not on file    Inability: Not on file  . Transportation needs:    Medical: Not on file    Non-medical: Not on file  Tobacco Use  . Smoking status: Never Smoker  . Smokeless tobacco: Never Used  . Tobacco comment: From spouse. Wife does not smoke around him now  Substance and Sexual Activity  . Alcohol use: No    Alcohol/week: 0.0 oz  . Drug use: No  . Sexual activity: Not on file  Lifestyle  . Physical activity:    Days per week: Not on file    Minutes per session: Not on file  . Stress: Not on file  Relationships  . Social connections:    Talks on phone: Not on file    Gets together: Not on file    Attends religious service: Not on file    Active member of club or organization: Not on file    Attends meetings of clubs or organizations: Not on file    Relationship status: Not on file  Other Topics Concern  . Not on file  Social History Narrative  . Not on file     Family History:  The patient's family history includes Atrial fibrillation in his sister; Depression in his maternal grandfather; Diabetes in his other and sister; Heart disease  in his other and sister.      ROS:   Please see the history of present illness.    ROS All other systems reviewed and are negative.   PHYSICAL EXAM:   VS:  BP 122/70   Pulse 90   Ht _0  (1.778 m)   Wt 177 lb 12.8 oz (80.6 kg)   SpO2 98%   BMI 25.51 kg/m    GEN: Well nourished, well developed, in no acute distress  HEENT: normal  Neck: no JVD, carotid bruits, or masses Cardiac: RRR; no murmurs, rubs, or gallops,no edema  Respiratory:  clear to auscultation bilaterally, normal work of breathing GI: soft, nontender, nondistended, + BS MS: no deformity or atrophy  Skin: warm and dry, no rash Neuro:  Alert and Oriented x 3, Strength and sensation are intact Psych: euthymic mood, full affect   Wt Readings from Last 3 Encounters:  12/30/17 177 lb 12.8 oz (80.6 kg)  12/23/17 181 lb 14.1 oz (82.5 kg)  12/18/17 182 lb 3.2 oz (82.6 kg)      Studies/Labs Reviewed:   EKG:  EKG is ordered today.  The ekg ordered today demonstrates NSR, HR 85  Recent Labs: 12/18/2017: ALT 20; B Natriuretic Peptide 144.0 12/23/2017: Magnesium 2.0 12/30/2017: BUN 22; Creatinine, Ser 1.37; Hemoglobin 15.6; Platelets 244; Potassium 4.4; Sodium 138; TSH 2.530   Lipid Panel    Component Value Date/Time   CHOL 137 08/31/2017 0806   TRIG 68 08/31/2017 0806   HDL 43 08/31/2017 0806   CHOLHDL 3.2 08/31/2017 0806   CHOLHDL 2.5 02/20/2016 0803   VLDL 11 02/20/2016 0803   LDLCALC 80 08/31/2017 0806    Additional studies/ records that were reviewed today include:  TAVR OPERATIVE NOTE   Date of Procedure:12/22/2017  Preoperative Diagnosis:Severe Aortic Stenosis  Procedure:  Transcatheter Aortic Valve Replacement - PercutaneousRightTransfemoral Approach Edwards Sapien 3 THV (size 28m, model # [id number] serial # [id number]  Co-Surgeons:Christopher MAngelena Form MD andClarence H. ORoxy Manns MD   Pre-operative Echo Findings: ? Severe  aortic stenosis ? Moderate aortic insufficiency ? Normalleft ventricular systolic function  Post-operative Echo Findings: ? Noparavalvular leak ? Normalleft ventricular systolic function   _____________  Post operative echo (12/23/17) Study Conclusions - Left ventricle: The cavity size was normal. Wall thickness was   increased in a pattern of moderate LVH. Systolic function was   normal. The estimated ejection fraction was in the range of 60%   to 65%. Doppler parameters are consistent with both elevated   ventricular end-diastolic filling pressure and elevated left   atrial filling pressure. - Aortic valve: Well placed 26 mm Sapien 3 valve with no   significant peri valvular regurgitation. Valve area (VTI): 1.44   cm^2. Valve area (Vmax): 1.34 cm^2. Valve area (Vmean): 1.44   cm^2. - Mitral valve: Calcified annulus. There was mild regurgitation. - Left atrium: The atrium was mildly dilated. - Atrial septum: No defect or patent foramen ovale was identified. - Pulmonary arteries: PA peak pressure: 31 mm Hg (S).    ASSESSMENT & PLAN:   Severe AS s/p TAVR: he has been doing well. Groin sites are stable. Continue ASA and plavix. SBE prophylaxis discussed; he has dentures. He has been having some hot spells and weakness. No more pre syncope. Will check some basic labs and continue to monitor.   HTN: BP well controlled today.   HLD: continue statin   GERD: continue protonix   Medication Adjustments/Labs and Tests Ordered: Current medicines are reviewed at length with the patient today.  Concerns regarding medicines are outlined above.  Medication changes, Labs and Tests ordered today are listed in the Patient Instructions below. Patient Instructions  Medication Instructions:  Your provider recommends that you continue on your current medications as directed. Please refer to the Current Medication list given to you today.    Labwork: TODAY: TSH, BMET,  CBC  Testing/Procedures: No new orders   Follow-Up: [facility] Please arrive by 12:30PM.  Any Other Special Instructions Will Be Listed Below (If Applicable).     If you need a refill on your cardiac medications before your next appointment, please call your pharmacy.      Signed, KAngelena Form PA-C  12/31/2017 10:08 AM    [facility] [address] [place]  [id number]: [phone] Fax: [phone]

## 2017-12-30 ENCOUNTER — Encounter: Payer: Self-pay | Admitting: Physician Assistant

## 2017-12-30 ENCOUNTER — Ambulatory Visit (INDEPENDENT_AMBULATORY_CARE_PROVIDER_SITE_OTHER): Payer: Medicare Other | Admitting: Physician Assistant

## 2017-12-30 VITALS — BP 122/70 | HR 90 | Ht 70.0 in | Wt 177.8 lb

## 2017-12-30 DIAGNOSIS — N179 Acute kidney failure, unspecified: Secondary | ICD-10-CM

## 2017-12-30 DIAGNOSIS — I10 Essential (primary) hypertension: Secondary | ICD-10-CM | POA: Diagnosis not present

## 2017-12-30 DIAGNOSIS — Z952 Presence of prosthetic heart valve: Secondary | ICD-10-CM | POA: Diagnosis not present

## 2017-12-30 DIAGNOSIS — K219 Gastro-esophageal reflux disease without esophagitis: Secondary | ICD-10-CM | POA: Diagnosis not present

## 2017-12-30 DIAGNOSIS — E785 Hyperlipidemia, unspecified: Secondary | ICD-10-CM | POA: Diagnosis not present

## 2017-12-30 NOTE — Patient Instructions (Signed)
Medication Instructions:  Your provider recommends that you continue on your current medications as directed. Please refer to the Current Medication list given to you today.    Labwork: TODAY: TSH, BMET, CBC  Testing/Procedures: No new orders   Follow-Up: Please keep your appointments for your echocardiogram and your evaluation at [facility] on 01/27/[facility]. Please arrive by 12:30PM.  Any Other Special Instructions Will Be Listed Below (If Applicable).     If you need a refill on your cardiac medications before your next appointment, please call your pharmacy.

## 2017-12-31 ENCOUNTER — Other Ambulatory Visit: Payer: Self-pay | Admitting: Physician Assistant

## 2017-12-31 DIAGNOSIS — N179 Acute kidney failure, unspecified: Secondary | ICD-10-CM

## 2017-12-31 LAB — TSH: TSH: 2.53 u[IU]/mL (ref 0.450–4.500)

## 2017-12-31 LAB — CBC WITH DIFFERENTIAL/PLATELET
Basophils Absolute: 0.1 10*3/uL (ref 0.0–0.2)
Basos: 1 %
EOS (ABSOLUTE): 0.2 10*3/uL (ref 0.0–0.4)
EOS: 3 %
HEMOGLOBIN: 15.6 g/dL (ref 13.0–17.7)
Hematocrit: 46.9 % (ref 37.5–51.0)
IMMATURE GRANS (ABS): 0.1 10*3/uL (ref 0.0–0.1)
IMMATURE GRANULOCYTES: 1 %
LYMPHS ABS: 2.4 10*3/uL (ref 0.7–3.1)
LYMPHS: 28 %
MCH: 30.2 pg (ref 26.6–33.0)
MCHC: 33.3 g/dL (ref 31.5–35.7)
MCV: 91 fL (ref 79–97)
MONOCYTES: 10 %
Monocytes Absolute: 0.9 10*3/uL (ref 0.1–0.9)
Neutrophils Absolute: 5 10*3/uL (ref 1.4–7.0)
Neutrophils: 57 %
Platelets: 244 10*3/uL (ref 150–450)
RBC: 5.16 x10E6/uL (ref 4.14–5.80)
RDW: 13.9 % (ref 12.3–15.4)
WBC: 8.6 10*3/uL (ref 3.4–10.8)

## 2017-12-31 LAB — BASIC METABOLIC PANEL
BUN / CREAT RATIO: 16 (ref 10–24)
BUN: 22 mg/dL (ref 8–27)
CHLORIDE: 99 mmol/L (ref 96–106)
CO2: 21 mmol/L (ref 20–29)
CREATININE: 1.37 mg/dL — AB (ref 0.76–1.27)
Calcium: 9.6 mg/dL (ref 8.6–10.2)
GFR calc Af Amer: 57 mL/min/{1.73_m2} — ABNORMAL LOW (ref >59)
GFR calc non Af Amer: 49 mL/min/{1.73_m2} — ABNORMAL LOW (ref >59)
GLUCOSE: 221 mg/dL — AB (ref 65–99)
Potassium: 4.4 mmol/L (ref 3.5–5.2)
Sodium: 138 mmol/L (ref 134–144)

## 2018-01-05 ENCOUNTER — Encounter: Payer: Self-pay | Admitting: Thoracic Surgery (Cardiothoracic Vascular Surgery)

## 2018-01-08 LAB — BASIC METABOLIC PANEL
BUN / CREAT RATIO: 16 (ref 10–24)
BUN: 22 mg/dL (ref 8–27)
CHLORIDE: 106 mmol/L (ref 96–106)
CO2: 19 mmol/L — ABNORMAL LOW (ref 20–29)
Calcium: 9.4 mg/dL (ref 8.6–10.2)
Creatinine, Ser: 1.35 mg/dL — ABNORMAL HIGH (ref 0.76–1.27)
GFR calc non Af Amer: 50 mL/min/{1.73_m2} — ABNORMAL LOW (ref >59)
GFR, EST AFRICAN AMERICAN: 58 mL/min/{1.73_m2} — AB (ref >59)
GLUCOSE: 129 mg/dL — AB (ref 65–99)
POTASSIUM: 4.3 mmol/L (ref 3.5–5.2)
Sodium: 141 mmol/L (ref 134–144)

## 2018-01-26 NOTE — Progress Notes (Signed)
[facility] [place]                                       Cardiology Office Note    Date:  01/28/2018   ID:  Jesse Cordova, DOB 1940-09-05, MRN [id number]  PCP:  [name], MD  Cardiologist: Dr. Oval Linsey / Dr. Angelena Form (TAVR)  CC:  1 month s/p TAVR   History of Present Illness:  Jesse Cordova is a 77 y.o. 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 He had a murmur on exam and an echocardiogram at that time showed moderate aortic stenosis with a mean gradient of 23 mmHg. There was also moderate aortic insufficiency. He was asymptomatic at that time he underwent his skin cancer surgery followed by radiation therapy. He now reports that over the past few months he has developed progressive exertional fatigue and shortness of breath.He had an echo on 11/10/2017 that showed progression of his aortic valve stenosis to severe with a mean gradient of 42 mmHg. There is moderate aortic insufficiency and normal left ventricular systolic function. 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   He underwent successful TAVR with a 26 mm Edwards Sapien 3 THV via the TF approach on 12/22/17. Post operative echo showed EF 60%, normally functioning TAVR with mean gradient 9 mm Hg. He had a vagal episode in the hospital and once after discharge and Toprol XL was discontinued.   At post hospital follow up he complained of episodes of hot spells and weakness. Toprol had been discontinued after discharge due to similar episodes. I ordered some labs which showed a slightly elevated creat 1.18--> 1.37 otherwise totally normal. I rechecked labs a week later and creat was 1.35. I asked him to resume his Toprol XL and  stop taking lisinopril 2.28m daily.  Today he presents to clinic for follow up. No CP. No LE edema, orthopnea or PND. No dizziness or syncope. No blood in stool or urine. No palpitations. His breathing is much improved and he has been walking a half hour everyday with no issues. He has not had any further presyncopal episodes. He is feeling much better since his surgery and been much more active.     Past Medical History:  Diagnosis Date  . Adrenal nodule ([place] 11/10/2011  . Arthritis   . Cancer ([place]    skin cancer  . Chronic low back pain   . Complication of anesthesia   . Diabetes mellitus   . GERD (gastroesophageal reflux disease)   . Hyperlipemia   . Hypertension   . PONV (postoperative nausea and vomiting)   . 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 BIOPSY  01/16/2012   Procedure: BIOPSY;  Surgeon: NRogene Houston MD;  [facility]: [facility];  Service: Endoscopy;  Laterality: N/A;  . CATARACT EXTRACTION Right 7/06  . CATARACT EXTRACTION W/PHACO Left 01/05/2014   Procedure: CATARACT EXTRACTION  PHACO AND INTRAOCULAR LENS PLACEMENT (IOC);  Surgeon: Tonny Branch, MD;  Location: [facility];  Service: Ophthalmology;  Laterality: Left;  CDE:15.98  . COLONOSCOPY  01/29/12   abnormal repeat in one year  . ESOPHAGOGASTRODUODENOSCOPY    . KNEE ARTHROSCOPY     2 right knee surgeries and 1 left knee surgery  . RIGHT/LEFT HEART CATH AND CORONARY ANGIOGRAPHY N/A 11/25/2017   Procedure: RIGHT/LEFT HEART CATH AND CORONARY ANGIOGRAPHY;  Surgeon: Burnell Blanks, MD;  Location: [facility];  Service: Cardiovascular;  Laterality: N/A;  . TEE WITHOUT CARDIOVERSION N/A 12/22/2017   Procedure: TRANSESOPHAGEAL ECHOCARDIOGRAM (TEE);  Surgeon: Burnell Blanks, MD;  Location: [place];  Service: Open Heart  Surgery;  Laterality: N/A;  . TONSILLECTOMY    . TRANSCATHETER AORTIC VALVE REPLACEMENT, TRANSFEMORAL N/A 12/22/2017   Procedure: TRANSCATHETER AORTIC VALVE REPLACEMENT, TRANSFEMORAL;  Surgeon: Burnell Blanks, MD;  Location: [place];  Service: Open Heart Surgery;  Laterality: N/A;  . VASECTOMY      Current Medications: Outpatient Medications Prior to Visit  Medication Sig Dispense Refill  . ACCU-CHEK AVIVA PLUS test strip USE TO TEST SUGAR TWICE DAILY 150 each 1  . ACCU-CHEK AVIVA PLUS test strip USE TO TEST SUGAR TWICE DAILY 100 each 5  . aspirin EC 81 MG tablet Take 81 mg by mouth daily.    . benzonatate (TESSALON) 200 MG capsule One three times daily as needed (Patient taking differently: Take 200 mg by mouth 3 (three) times daily as needed for cough. One three times daily as needed) 30 capsule 12  . blood glucose meter kit and supplies KIT Dispense based on patient and insurance preference. Use to test glucose twice daily. (For ICD 10 code E11.9) 1 each 5  . cetirizine (ZYRTEC) 10 MG tablet Take 10 mg by mouth daily.    . clopidogrel (PLAVIX) 75 MG tablet Take 1 tablet (75 mg total) by mouth daily with breakfast. 90 tablet 1  . diphenoxylate-atropine (LOMOTIL) 2.5-0.025 MG tablet TAKE 1 TABLET BY MOUTH FOUR TIMES DAILY AS NEEDED FOR DIARRHEA OR LOOSE STOOLS 30 tablet 3  . empagliflozin (JARDIANCE) 25 MG TABS tablet Take 25 mg by mouth daily. 30 tablet 5  . glipiZIDE (GLUCOTROL) 5 MG tablet TAKE 1/2 TABLET BY MOUTH TWICE DAILY BEFORE A MEAL 90 tablet 1  . Insulin Glargine (LANTUS SOLOSTAR) 100 UNIT/ML Solostar Pen INJECT UP TO 30 UNITS INTO SKIN EVERY NIGHT AT BEDTIME AS DIRECTED BY DOCTOR 30 mL 3  . Insulin Pen Needle (B-D ULTRAFINE III SHORT PEN) 31G X 8 MM MISC Use once daily 300 each 0  . lovastatin (MEVACOR) 20 MG tablet TAKE 1 TABLET(20 MG) BY MOUTH AT BEDTIME 90 tablet 1  . metoprolol succinate (TOPROL-XL) 25 MG 24 hr tablet Take 1 tablet by mouth daily.    . ondansetron  (ZOFRAN) 4 MG tablet Take 4 mg by mouth every 8 (eight) hours as needed for nausea or vomiting.    . pantoprazole (PROTONIX) 40 MG tablet Take 1 tablet (40 mg total) by mouth daily. 90 tablet 3  . silver sulfADIAZINE (SILVADENE) 1 % cream Apply 1 application topically daily as needed (skin irritation).    . triamcinolone cream (KENALOG) 0.1 % Apply 1 application topically 2 (two) times daily as needed. 45 g 4  . lisinopril (PRINIVIL,ZESTRIL) 2.5 MG tablet Take 1 tablet (2.5 mg total) by mouth daily. 30 tablet 5   No facility-administered medications prior to visit.      Allergies:  Cortisone; Tetanus toxoids; and Hydrocodone-homatropine   Social History   Socioeconomic History  . Marital status: Married    Spouse name: Not on file  . Number of children: Not on file  . Years of education: Not on file  . Highest education level: Not on file  Occupational History  . Not on file  Social Needs  . Financial resource strain: Not on file  . Food insecurity:    Worry: Not on file    Inability: Not on file  . Transportation needs:    Medical: Not on file    Non-medical: Not on file  Tobacco Use  . Smoking status: Never Smoker  . Smokeless tobacco: Never Used  . Tobacco comment: From spouse. Wife does not smoke around him now  Substance and Sexual Activity  . Alcohol use: No    Alcohol/week: 0.0 standard drinks  . Drug use: No  . Sexual activity: Not on file  Lifestyle  . Physical activity:    Days per week: Not on file    Minutes per session: Not on file  . Stress: Not on file  Relationships  . Social connections:    Talks on phone: Not on file    Gets together: Not on file    Attends religious service: Not on file    Active member of club or organization: Not on file    Attends meetings of clubs or organizations: Not on file    Relationship status: Not on file  Other Topics Concern  . Not on file  Social History Narrative  . Not on file     Family History:  The  patient's family history includes Atrial fibrillation in his sister; Depression in his maternal grandfather; Diabetes in his other and sister; Heart disease in his other and sister.      ROS:   Please see the history of present illness.    ROS All other systems reviewed and are negative.   PHYSICAL EXAM:   VS:  BP 120/64   Pulse 65   Ht _0  (1.778 m)   Wt 182 lb (82.6 kg)   SpO2 95%   BMI 26.11 kg/m    GEN: Well nourished, well developed, in no acute distress  HEENT: normal  Neck: no JVD, carotid bruits, or masses Cardiac: RRR; no murmurs, rubs, or gallops,no edema  Respiratory:  clear to auscultation bilaterally, normal work of breathing GI: soft, nontender, nondistended, + BS MS: no deformity or atrophy  Skin: warm and dry, no rash Neuro:  Alert and Oriented x 3, Strength and sensation are intact Psych: euthymic mood, full affect   Wt Readings from Last 3 Encounters:  01/27/18 182 lb (82.6 kg)  12/30/17 177 lb 12.8 oz (80.6 kg)  12/23/17 181 lb 14.1 oz (82.5 kg)      Studies/Labs Reviewed:   EKG:  EKG is NOT ordered today.    Recent Labs: 12/18/2017: ALT 20; B Natriuretic Peptide 144.0 12/23/2017: Magnesium 2.0 12/30/2017: Hemoglobin 15.6; Platelets 244; TSH 2.530 01/27/2018: BUN 18; Creatinine, Ser 1.23; Potassium 4.3; Sodium 140   Lipid Panel    Component Value Date/Time   CHOL 137 08/31/2017 0806   TRIG 68 08/31/2017 0806   HDL 43 08/31/2017 0806   CHOLHDL 3.2 08/31/2017 0806   CHOLHDL 2.5 02/20/2016 0803   VLDL 11 02/20/2016 0803   LDLCALC 80 08/31/2017 0806    Additional studies/ records that were reviewed today include:  TAVR OPERATIVE NOTE   Date of Procedure:12/22/2017  Preoperative Diagnosis:Severe Aortic Stenosis  Procedure:   Transcatheter Aortic Valve Replacement - PercutaneousRightTransfemoral Approach Edwards Sapien 3 THV (size 42m, model # 9600TFX, serial #  [id number]  Co-Surgeons:Christopher MAngelena Form MD andClarence H. ORoxy Manns MD   Pre-operative Echo Findings: ? Severe aortic stenosis ? Moderate aortic insufficiency ? Normalleft ventricular systolic function  Post-operative Echo Findings: ? Noparavalvular leak ? Normalleft ventricular systolic function   _____________  Post operative echo (12/23/17) Study Conclusions - Left ventricle: The cavity size was normal. Wall thickness was increased in a pattern of moderate LVH. Systolic function was normal. The estimated ejection fraction was in the range of 60% to 65%. Doppler parameters are consistent with both elevated ventricular end-diastolic filling pressure and elevated left atrial filling pressure. - Aortic valve: Well placed 26 mm Sapien 3 valve with no significant peri valvular regurgitation. Valve area (VTI): 1.44 cm^2. Valve area (Vmax): 1.34 cm^2. Valve area (Vmean): 1.44 cm^2. - Mitral valve: Calcified annulus. There was mild regurgitation. - Left atrium: The atrium was mildly dilated. - Atrial septum: No defect or patent foramen ovale was identified. - Pulmonary arteries: PA peak pressure: 31 mm Hg (S).  _____________  2D ECHO 01/27/18 (1 month s/p TAVR) Study Conclusions - Left ventricle: The cavity size was normal. Wall thickness was   increased in a pattern of mild LVH. Systolic function was   vigorous. The estimated ejection fraction was in the range of 65%   to 70%. Wall motion was normal; there were no regional wall   motion abnormalities. Doppler parameters are consistent with   abnormal left ventricular relaxation (grade 1 diastolic   dysfunction). - Aortic valve: A 253mSapien 3 Valve TAVR bioprosthesis was   present. Peak velocity (S): 196 cm/s. Mean gradient (S): 9 mm Hg. - Mitral valve: Calcified annulus. Mildly thickened leaflets .   There was mild regurgitation. - Tricuspid valve: There was mild  regurgitation. Impressions: - Compared to the prior study, there has been no significant   interval change  ASSESSMENT & PLAN:   Severe AS s/p TAVR: 2D ECHO today shows EF 65% and normally functioning TAVR with no PVL and mean gradient of 71m4mg. He has NYHA class I symptoms. SBE prophylaxis discussed; he has dentures and doesn't go to the dentist. Plavix can be discontinued after 6 months of therapy (jan 2020)  HTN: BP well controlled today  HLD:  continue statin   AKI: 1.18--> 1.37. Lisinopril stopped. Will check BMET today   Medication Adjustments/Labs and Tests Ordered: Current medicines are reviewed at length with the patient today.  Concerns regarding medicines are outlined above.  Medication changes, Labs and Tests ordered today are listed in the Patient Instructions below. Patient Instructions  Medication Instructions:  1) STOP PLAVIX June 25, 2018 unless told by a doctor to continue.   Labwork: TODAY: BMET  Testing/Procedures: None  Follow-Up: Your provider recommends that you schedule a follow-up appointment in 4 months with Dr. RanOval Linsey Any Other Special Instructions Will Be Listed Below (If Applicable).     If you need a refill on your cardiac medications before your next appointment, please call your pharmacy.      Signed, KatAngelena FormA-C  01/28/2018 10:21 AM    [facility] [address]  [id number]: [phone]: [phone]

## 2018-01-27 ENCOUNTER — Ambulatory Visit: Payer: Medicare Other | Admitting: Physician Assistant

## 2018-01-27 ENCOUNTER — Ambulatory Visit (INDEPENDENT_AMBULATORY_CARE_PROVIDER_SITE_OTHER): Payer: Medicare Other | Admitting: Physician Assistant

## 2018-01-27 ENCOUNTER — Other Ambulatory Visit: Payer: Self-pay | Admitting: Physician Assistant

## 2018-01-27 ENCOUNTER — Ambulatory Visit (HOSPITAL_COMMUNITY): Payer: Medicare Other | Attending: Physician Assistant

## 2018-01-27 ENCOUNTER — Encounter: Payer: Self-pay | Admitting: Physician Assistant

## 2018-01-27 ENCOUNTER — Other Ambulatory Visit: Payer: Self-pay

## 2018-01-27 VITALS — BP 120/64 | HR 65 | Ht 70.0 in | Wt 182.0 lb

## 2018-01-27 DIAGNOSIS — N179 Acute kidney failure, unspecified: Secondary | ICD-10-CM | POA: Diagnosis not present

## 2018-01-27 DIAGNOSIS — Z952 Presence of prosthetic heart valve: Secondary | ICD-10-CM

## 2018-01-27 DIAGNOSIS — I119 Hypertensive heart disease without heart failure: Secondary | ICD-10-CM | POA: Diagnosis not present

## 2018-01-27 DIAGNOSIS — I10 Essential (primary) hypertension: Secondary | ICD-10-CM | POA: Diagnosis not present

## 2018-01-27 DIAGNOSIS — Z953 Presence of xenogenic heart valve: Secondary | ICD-10-CM | POA: Diagnosis not present

## 2018-01-27 DIAGNOSIS — E785 Hyperlipidemia, unspecified: Secondary | ICD-10-CM

## 2018-01-27 DIAGNOSIS — I081 Rheumatic disorders of both mitral and tricuspid valves: Secondary | ICD-10-CM | POA: Insufficient documentation

## 2018-01-27 NOTE — Patient Instructions (Signed)
Medication Instructions:  1) STOP PLAVIX June 25, 2018 unless told by a doctor to continue.   Labwork: TODAY: BMET  Testing/Procedures: None  Follow-Up: Your provider recommends that you schedule a follow-up appointment in 4 months with Dr. Oval Linsey.   Any Other Special Instructions Will Be Listed Below (If Applicable).     If you need a refill on your cardiac medications before your next appointment, please call your pharmacy.

## 2018-01-28 LAB — BASIC METABOLIC PANEL
BUN/Creatinine Ratio: 15 (ref 10–24)
BUN: 18 mg/dL (ref 8–27)
CALCIUM: 9.5 mg/dL (ref 8.6–10.2)
CO2: 21 mmol/L (ref 20–29)
Chloride: 104 mmol/L (ref 96–106)
Creatinine, Ser: 1.23 mg/dL (ref 0.76–1.27)
GFR calc Af Amer: 65 mL/min/{1.73_m2} (ref >59)
GFR, EST NON AFRICAN AMERICAN: 56 mL/min/{1.73_m2} — AB (ref >59)
Glucose: 140 mg/dL — ABNORMAL HIGH (ref 65–99)
Potassium: 4.3 mmol/L (ref 3.5–5.2)
Sodium: 140 mmol/L (ref 134–144)

## 2018-01-29 ENCOUNTER — Ambulatory Visit: Payer: Medicare Other | Admitting: Podiatry

## 2018-02-22 ENCOUNTER — Encounter: Payer: Self-pay | Admitting: Thoracic Surgery (Cardiothoracic Vascular Surgery)

## 2018-03-03 ENCOUNTER — Ambulatory Visit (INDEPENDENT_AMBULATORY_CARE_PROVIDER_SITE_OTHER): Payer: Medicare Other | Admitting: *Deleted

## 2018-03-03 DIAGNOSIS — Z23 Encounter for immunization: Secondary | ICD-10-CM

## 2018-03-08 ENCOUNTER — Ambulatory Visit (HOSPITAL_COMMUNITY)
Admission: RE | Admit: 2018-03-08 | Discharge: 2018-03-08 | Disposition: A | Discharge status: Home / Self Care | Payer: Medicare Other | Source: Ambulatory Visit | Attending: Family Medicine | Admitting: Family Medicine

## 2018-03-08 ENCOUNTER — Ambulatory Visit: Payer: Medicare Other | Admitting: Family Medicine

## 2018-03-08 ENCOUNTER — Encounter: Payer: Self-pay | Admitting: Family Medicine

## 2018-03-08 VITALS — BP 130/78 | Ht 70.0 in | Wt 181.0 lb

## 2018-03-08 DIAGNOSIS — M19072 Primary osteoarthritis, left ankle and foot: Secondary | ICD-10-CM | POA: Insufficient documentation

## 2018-03-08 DIAGNOSIS — M79672 Pain in left foot: Secondary | ICD-10-CM | POA: Diagnosis present

## 2018-03-08 DIAGNOSIS — M7732 Calcaneal spur, left foot: Secondary | ICD-10-CM | POA: Diagnosis not present

## 2018-03-08 DIAGNOSIS — E119 Type 2 diabetes mellitus without complications: Secondary | ICD-10-CM

## 2018-03-08 DIAGNOSIS — Z794 Long term (current) use of insulin: Secondary | ICD-10-CM

## 2018-03-08 LAB — POCT GLYCOSYLATED HEMOGLOBIN (HGB A1C): Hemoglobin A1C: 6.6 % — AB (ref 4.0–5.6)

## 2018-03-08 NOTE — Progress Notes (Signed)
   Subjective:    Patient ID: Jesse Cordova, male    DOB: March 02, 1941, 77 y.o.   MRN: [id number]  Foot Pain  This is a new problem. Episode onset: one and a half to two weeks. Pertinent negatives include no abdominal pain, chest pain, congestion, coughing, diaphoresis, fatigue, headaches or rash. Associated symptoms comments: Left foot pain and swelling . He has tried acetaminophen for the symptoms.  Patient relates midfoot pain toward where the MTP joints are.  No known injury.  Denies any other injuries or problems.  No fever chills Results for orders placed or performed in visit on 03/08/18  POCT glycosylated hemoglobin (Hb A1C)  Result Value Ref Range   Hemoglobin A1C 6.6 (A) 4.0 - 5.6 %   HbA1c POC (<> result, manual entry)     HbA1c, POC (prediabetic range)     HbA1c, POC (controlled diabetic range)       Review of Systems  Constitutional: Negative for diaphoresis and fatigue.  HENT: Negative for congestion and rhinorrhea.   Respiratory: Negative for cough and shortness of breath.   Cardiovascular: Negative for chest pain and leg swelling.  Gastrointestinal: Negative for abdominal pain and diarrhea.  Skin: Negative for color change and rash.  Neurological: Negative for dizziness and headaches.  Psychiatric/Behavioral: Negative for behavioral problems and confusion.       Objective:   Physical Exam  Constitutional: He appears well-nourished. No distress.  HENT:  Head: Normocephalic and atraumatic.  Eyes: Right eye exhibits no discharge. Left eye exhibits no discharge.  Neck: No tracheal deviation present.  Cardiovascular: Normal rate, regular rhythm and normal heart sounds.  No murmur heard. Pulmonary/Chest: Effort normal and breath sounds normal. No respiratory distress.  Musculoskeletal: He exhibits no edema.  Lymphadenopathy:    He has no cervical adenopathy.  Neurological: He is alert. Coordination normal.  Skin: Skin is warm and dry.  Psychiatric: He has a normal  mood and affect. His behavior is normal.  Vitals reviewed.   Tenderness in the left foot in the MTP joint area      Assessment & Plan:  Metatarsalgia-x-rays-may need referral to podiatry for injections Patient cannot be on anti-inflammatories Possibly we will try low-dose steroids for a few days I recommend that he do his walking every other day to give his feet rest  Diabetes good control comprehensive checkup within 4 months to 6 months

## 2018-03-09 ENCOUNTER — Encounter: Payer: Self-pay | Admitting: Family Medicine

## 2018-03-10 ENCOUNTER — Other Ambulatory Visit: Payer: Self-pay | Admitting: Family Medicine

## 2018-03-10 MED ORDER — PREDNISONE 10 MG PO TABS: ORAL_TABLET | ORAL | 0 refills | Status: DC

## 2018-03-15 ENCOUNTER — Encounter: Payer: Self-pay | Admitting: Family Medicine

## 2018-03-16 ENCOUNTER — Other Ambulatory Visit: Payer: Self-pay

## 2018-03-16 ENCOUNTER — Other Ambulatory Visit: Payer: Self-pay | Admitting: Family Medicine

## 2018-03-16 MED ORDER — GLUCOSE BLOOD VI STRP: ORAL_STRIP | 5 refills | Status: DC

## 2018-03-17 ENCOUNTER — Encounter: Payer: Self-pay | Admitting: Family Medicine

## 2018-03-17 ENCOUNTER — Other Ambulatory Visit: Payer: Self-pay | Admitting: Family Medicine

## 2018-03-17 DIAGNOSIS — M79672 Pain in left foot: Secondary | ICD-10-CM

## 2018-03-17 NOTE — Telephone Encounter (Signed)
I recommend referral to the [facility] for further evaluation since he has been there before that they should be able to help him further nurses please go ahead and set up this referral Also inform family that this is being done

## 2018-03-18 ENCOUNTER — Other Ambulatory Visit: Payer: Self-pay | Admitting: Family Medicine

## 2018-03-20 ENCOUNTER — Ambulatory Visit: Payer: Medicare Other | Admitting: Podiatry

## 2018-03-20 ENCOUNTER — Ambulatory Visit (INDEPENDENT_AMBULATORY_CARE_PROVIDER_SITE_OTHER): Payer: Medicare Other

## 2018-03-20 ENCOUNTER — Encounter: Payer: Self-pay | Admitting: Podiatry

## 2018-03-20 DIAGNOSIS — M779 Enthesopathy, unspecified: Secondary | ICD-10-CM | POA: Diagnosis not present

## 2018-03-20 DIAGNOSIS — M7742 Metatarsalgia, left foot: Secondary | ICD-10-CM | POA: Diagnosis not present

## 2018-03-20 MED ORDER — TRIAMCINOLONE ACETONIDE 10 MG/ML IJ SUSP
10.0000 mg | Freq: Once | INTRAMUSCULAR | Status: AC
  Administered 2018-03-20: 10 mg

## 2018-03-21 NOTE — Progress Notes (Signed)
Subjective: 77 year old male presents today with his daughter for concerns of left foot pain.  He states that he started getting pain in the left foot after he recently had heart surgery and he has been doing more walking.  He has seen his primary care physician before the x-rays he had performed previously were negative and a low-dose steroid which helps some but he continues to have symptoms.  He is noticed some swelling to the forefoot area as well.  No numbness or tingling this started.  He is diabetic and last A1c was around 6. Denies any systemic complaints such as fevers, chills, nausea, vomiting. No acute changes since last appointment, and no other complaints at this time.   Objective: AAO x3, NAD DP/PT pulses palpable bilaterally, CRT less than 3 seconds Total foot there is tenderness along most of the second interspace between the second and third toe.  Mild discomfort submetatarsal area as well as minimal discomfort of the third interspace.  There is no area pinpoint bony tenderness there is no pain directly on the metatarsals.  There is no erythema or warmth associated with it there is no open lesions. No open lesions or pre-ulcerative lesions.  No pain with calf compression, swelling, warmth, erythema  Assessment: 77 year old male with capsulitis left foot; metatarsalgia  Plan: -All treatment options discussed with the patient including all alternatives, risks, complications.  -Repeat x-rays were obtained which did not reveal any evidence of acute fracture or stress fracture. -Steroid injection performed.  See procedure note below.  Encouraged elevation and ice.  Unfortunately need to hold off on further oral steroids as well as anti-inflammatories.  Discussed wearing a stiffer soled shoe. -Patient encouraged to call the office with any questions, concerns, change in symptoms.   Procedure: Injection Tendon/Ligament Discussed alternatives, risks, complications and verbal consent was  obtained.  Location: Left 2nd interspace, dorsal approach Skin Prep: Alcohol. Injectate: 0.5cc 0.5% marcaine plain, 0.5 cc 2% lidocaine plain and, 1 cc kenalog 10. Disposition: Patient tolerated procedure well. Injection site dressed with a band-aid.  Post-injection care was discussed and return precautions discussed.   *He has an allergy to cortisone however he states this is been in the injection before to cause some swelling but no other systemic complaints.  Trula Slade DPM

## 2018-04-30 ENCOUNTER — Ambulatory Visit: Payer: Medicare Other | Admitting: Family Medicine

## 2018-04-30 ENCOUNTER — Encounter: Payer: Self-pay | Admitting: Family Medicine

## 2018-04-30 VITALS — BP 136/74 | Temp 97.6°F | Ht 70.0 in | Wt 183.8 lb

## 2018-04-30 DIAGNOSIS — J329 Chronic sinusitis, unspecified: Secondary | ICD-10-CM | POA: Diagnosis not present

## 2018-04-30 MED ORDER — AMOXICILLIN 500 MG PO CAPS: 500.0000 mg | ORAL_CAPSULE | Freq: Three times a day (TID) | ORAL | 0 refills | Status: DC

## 2018-04-30 MED ORDER — BENZONATATE 200 MG PO CAPS: 200.0000 mg | ORAL_CAPSULE | Freq: Three times a day (TID) | ORAL | 0 refills | Status: DC | PRN

## 2018-04-30 NOTE — Progress Notes (Signed)
   Subjective:    Patient ID: Jesse Cordova, male    DOB: February 20, 1941, 77 y.o.   MRN: [id number]  Sore Throat   This is a new problem. The current episode started yesterday. There has been no fever. Associated symptoms include headaches and a hoarse voice. Associated symptoms comments: Sinus drainage. He has tried NSAIDs (Tessalon pearls) for the symptoms. The treatment provided no relief.  pt would like refill on tessalon pearls   Hit hard yest morn  Sore throat and hoarse   Energy lefel  Whole thing hurting   Clear cough ane disch     Got  Gly shot      Review of Systems  HENT: Positive for hoarse voice.   Neurological: Positive for headaches.       Objective:   Physical Exam  Alert, mild malaise. Hydration good Vitals stable. frontal/ maxillary tenderness evident positive nasal congestion. pharynx normal neck supple  lungs clear/no crackles or wheezes. heart regular in rhythm       Assessment & Plan:  Impression rhinosinusitis likely post viral, discussed with patient. plan antibiotics prescribed. Questions answered. Symptomatic care discussed. warning signs discussed. WSL

## 2018-05-03 ENCOUNTER — Telehealth: Payer: Self-pay | Admitting: Family Medicine

## 2018-05-03 NOTE — Telephone Encounter (Signed)
Granddaughter states tomorrow will be fine she was transferred up front to schedule an appt for 05/04/2018.

## 2018-05-03 NOTE — Telephone Encounter (Signed)
Actually what would be best is for him to be rechecked. We can see him tomorrow morning if that is suitable for family After evaluating him then we can decide does he need any lab work or x-rays  please make sure that the patient is not having any severe issues that needs addressing today

## 2018-05-03 NOTE — Telephone Encounter (Signed)
Not sure if running a temp.per [place] dad. Granddaughter Erline Levine) wants to know if a xray is needed. He is getting no better.

## 2018-05-03 NOTE — Telephone Encounter (Signed)
Patient was seen on 11/29 and prescribed tessalon 200 mg and amoxil 500 mg for bad cough and congestion.Patient states cough is worst and not getting any rest because of cough. [employer]

## 2018-05-04 ENCOUNTER — Ambulatory Visit: Payer: Medicare Other | Admitting: Family Medicine

## 2018-05-04 ENCOUNTER — Telehealth: Payer: Self-pay | Admitting: *Deleted

## 2018-05-04 ENCOUNTER — Ambulatory Visit (HOSPITAL_COMMUNITY)
Admission: RE | Admit: 2018-05-04 | Discharge: 2018-05-04 | Disposition: A | Discharge status: Home / Self Care | Payer: Medicare Other | Source: Ambulatory Visit | Attending: Family Medicine | Admitting: Family Medicine

## 2018-05-04 VITALS — BP 122/70 | Temp 98.2°F | Ht 70.0 in | Wt 179.0 lb

## 2018-05-04 DIAGNOSIS — J181 Lobar pneumonia, unspecified organism: Secondary | ICD-10-CM | POA: Insufficient documentation

## 2018-05-04 DIAGNOSIS — J189 Pneumonia, unspecified organism: Secondary | ICD-10-CM

## 2018-05-04 MED ORDER — AZITHROMYCIN 250 MG PO TABS: ORAL_TABLET | ORAL | 0 refills | Status: DC

## 2018-05-04 MED ORDER — CEFTRIAXONE SODIUM 1 G IJ SOLR
500.0000 mg | Freq: Once | INTRAMUSCULAR | Status: AC
  Administered 2018-05-04: 500 mg via INTRAMUSCULAR

## 2018-05-04 MED ORDER — ALBUTEROL SULFATE (2.5 MG/3ML) 0.083% IN NEBU: 2.5000 mg | INHALATION_SOLUTION | RESPIRATORY_TRACT | 12 refills | Status: DC | PRN

## 2018-05-04 NOTE — Progress Notes (Signed)
   Subjective:    Patient ID: Jesse Cordova, male    DOB: 11-Mar-1941, 77 y.o.   MRN: [id number]  Cough  This is a new problem. Episode onset: 5 days. Associated symptoms include rhinorrhea and shortness of breath. Pertinent negatives include no chest pain, chills, ear pain, fever or wheezing. Associated symptoms comments: congestion. Treatments tried: otc meds, ibuprofen, tessalon, amoxil.   Patient was seen last week.  Having significant problems with congestion coughing also finding himself feeling short of breath increased coughing denies high fever sweats chills energy level subpar patient trying to eat and drink as best he can PMH benign   Review of Systems  Constitutional: Negative for activity change, chills and fever.  HENT: Positive for congestion and rhinorrhea. Negative for ear pain.   Eyes: Negative for discharge.  Respiratory: Positive for cough and shortness of breath. Negative for wheezing.   Cardiovascular: Negative for chest pain.  Gastrointestinal: Negative for nausea and vomiting.  Musculoskeletal: Negative for arthralgias.       Objective:   Physical Exam  Constitutional: He appears well-developed.  HENT:  Head: Normocephalic.  Mouth/Throat: Oropharynx is clear and moist. No oropharyngeal exudate.  Neck: Normal range of motion.  Cardiovascular: Normal rate, regular rhythm and normal heart sounds.  No murmur heard. Pulmonary/Chest: Effort normal. No stridor. He has no wheezes. He has rales. He exhibits no tenderness.  Lymphadenopathy:    He has no cervical adenopathy.  Neurological: He exhibits normal muscle tone.  Skin: Skin is warm and dry.  Nursing note and vitals reviewed.  Patient has crackles in the right base   25 minutes was spent with the patient.  This statement verifies that 25 minutes was indeed spent with the patient.  More than 50% of this visit-total duration of the visit-was spent in counseling and coordination of care. The issues that the  patient came in for today as reflected in the diagnosis (s) please refer to documentation for further details.      Assessment & Plan:  Pneumonia Warning signs discussed Use nebulizer treatments 3 to 4 hours as needed Rocephin 500 mg, Z-Pak X-ray ordered Warning signs discussed in detail follow-up if progressive troubles follow-up if worse O2 saturation 96%

## 2018-05-04 NOTE — Telephone Encounter (Signed)
See results Pt aware thanks

## 2018-05-04 NOTE — Telephone Encounter (Signed)
Stat cxr result in epic to review.

## 2018-05-08 ENCOUNTER — Other Ambulatory Visit: Payer: Self-pay | Admitting: Family Medicine

## 2018-05-16 ENCOUNTER — Encounter: Payer: Self-pay | Admitting: Family Medicine

## 2018-06-01 ENCOUNTER — Ambulatory Visit: Payer: Medicare Other | Admitting: Orthopaedic Surgery

## 2018-06-01 ENCOUNTER — Ambulatory Visit (INDEPENDENT_AMBULATORY_CARE_PROVIDER_SITE_OTHER): Payer: Medicare Other

## 2018-06-01 ENCOUNTER — Telehealth: Payer: Self-pay | Admitting: Orthopaedic Surgery

## 2018-06-01 ENCOUNTER — Encounter: Payer: Self-pay | Admitting: Orthopaedic Surgery

## 2018-06-01 VITALS — BP 148/77 | HR 115 | Ht 70.0 in | Wt 175.0 lb

## 2018-06-01 DIAGNOSIS — M25561 Pain in right knee: Secondary | ICD-10-CM

## 2018-06-01 DIAGNOSIS — G8929 Other chronic pain: Secondary | ICD-10-CM | POA: Diagnosis not present

## 2018-06-01 NOTE — Progress Notes (Signed)
Patient Jesse Cordova, male DOB:18-Jun-1940, 77 y.o. SFK:[id number]  Chief Complaint  Patient presents with  . Knee Pain    right     HPI  Jesse Cordova is a 77 y.o. male who developed right knee pain about two weeks ago.  He has had swelling, popping and giving way.  He has no trauma.  He is using a cane.  He has tried ice, heat, rubs, tylenol and Advil.  He has no redness.  He has no numbness.   Body mass index is 25.11 kg/m.  ROS  Review of Systems  Constitutional: Positive for activity change.  Musculoskeletal: Positive for arthralgias, back pain, gait problem and joint swelling.  All other systems reviewed and are negative.  For Review of Systems, all other systems reviewed and are negative.  The following is a summary of the past history medically, past history surgically, known current medicines, social history and family history.  This information is gathered electronically by the computer from prior information and documentation.  I review this each visit and have found including this information at this point in the chart is beneficial and informative.   Past Medical History:  Diagnosis Date  . Adrenal nodule ([place]) 11/10/2011  . Arthritis   . Cancer ([place])    skin cancer  . Chronic low back pain   . Complication of anesthesia   . Diabetes mellitus   . GERD (gastroesophageal reflux disease)   . Hyperlipemia   . Hypertension   . PONV (postoperative nausea and vomiting)   . 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 BIOPSY  01/16/2012   Procedure: BIOPSY;  Surgeon: Rogene Houston, MD;  [facility];  Service: Endoscopy;  Laterality: N/A;  . CATARACT EXTRACTION Right 7/06  . CATARACT EXTRACTION W/PHACO Left 01/05/2014   Procedure: CATARACT EXTRACTION PHACO AND INTRAOCULAR LENS  PLACEMENT (IOC);  Surgeon: Tonny Branch, MD;  Location: [facility];  Service: Ophthalmology;  Laterality: Left;  CDE:15.98  . COLONOSCOPY  01/29/12   abnormal repeat in one year  . ESOPHAGOGASTRODUODENOSCOPY    . KNEE ARTHROSCOPY     2 right knee surgeries and 1 left knee surgery  . RIGHT/LEFT HEART CATH AND CORONARY ANGIOGRAPHY N/A 11/25/2017   Procedure: RIGHT/LEFT HEART CATH AND CORONARY ANGIOGRAPHY;  Surgeon: Burnell Blanks, MD;  Location: [facility];  Service: Cardiovascular;  Laterality: N/A;  . TEE WITHOUT CARDIOVERSION N/A 12/22/2017   Procedure: TRANSESOPHAGEAL ECHOCARDIOGRAM (TEE);  Surgeon: Burnell Blanks, MD;  Location: [facility];  Service: Open Heart Surgery;  Laterality: N/A;  . TONSILLECTOMY    . TRANSCATHETER AORTIC VALVE REPLACEMENT, TRANSFEMORAL N/A 12/22/2017   Procedure: TRANSCATHETER AORTIC VALVE REPLACEMENT, TRANSFEMORAL;  Surgeon: Burnell Blanks, MD;  Location: [facility];  Service: Open Heart Surgery;  Laterality: N/A;  . VASECTOMY      Current Outpatient Medications on File Prior to Visit  Medication Sig Dispense Refill  . ACCU-CHEK AVIVA PLUS test strip USE TO TEST SUGAR TWICE DAILY 150 each 1  . ACCU-CHEK AVIVA PLUS test strip USE TO TEST SUGAR TWICE DAILY 100 each 5  . aspirin EC 81 MG tablet Take 81 mg by mouth daily.    . benzonatate (TESSALON) 200 MG capsule One three times daily as needed (Patient taking differently: Take 200 mg by mouth  3 (three) times daily as needed for cough. One three times daily as needed) 30 capsule 12  . benzonatate (TESSALON) 200 MG capsule Take 1 capsule (200 mg total) by mouth 3 (three) times daily as needed for cough. 30 capsule 0  . blood glucose meter kit and supplies KIT Dispense based on patient and insurance preference. Use to test glucose twice daily. (For ICD 10 code E11.9) 1 each 5  . cetirizine (ZYRTEC) 10 MG tablet Take 10 mg by mouth daily.    . clopidogrel (PLAVIX) 75 MG tablet Take 1 tablet (75 mg  total) by mouth daily with breakfast. 90 tablet 1  . diphenoxylate-atropine (LOMOTIL) 2.5-0.025 MG tablet TAKE 1 TABLET BY MOUTH FOUR TIMES DAILY AS NEEDED FOR DIARRHEA OR LOOSE STOOLS 30 tablet 3  . empagliflozin (JARDIANCE) 25 MG TABS tablet Take 25 mg by mouth daily. 30 tablet 5  . glipiZIDE (GLUCOTROL) 5 MG tablet TAKE 1/2 TABLET BY MOUTH TWICE DAILY BEFORE A MEAL. 90 tablet 0  . glucose blood (ACCU-CHEK AVIVA PLUS) test strip TEST 2 TIMES A DAY AS DIRECTED. 100 each 5  . Insulin Glargine (LANTUS SOLOSTAR) 100 UNIT/ML Solostar Pen INJECT UP TO 30 UNITS INTO SKIN EVERY NIGHT AT BEDTIME AS DIRECTED BY DOCTOR 30 mL 3  . Insulin Pen Needle (B-D ULTRAFINE III SHORT PEN) 31G X 8 MM MISC Use once daily 300 each 0  . lovastatin (MEVACOR) 20 MG tablet TAKE 1 TABLET(20 MG) BY MOUTH AT BEDTIME 90 tablet 1  . ondansetron (ZOFRAN) 4 MG tablet Take 4 mg by mouth every 8 (eight) hours as needed for nausea or vomiting.    . pantoprazole (PROTONIX) 40 MG tablet Take 1 tablet (40 mg total) by mouth daily. 90 tablet 3  . triamcinolone cream (KENALOG) 0.1 % Apply 1 application topically 2 (two) times daily as needed. 45 g 4  . albuterol (PROVENTIL) (2.5 MG/3ML) 0.083% nebulizer solution Take 3 mLs (2.5 mg total) by nebulization every 4 (four) hours as needed for wheezing. (Patient not taking: Reported on 06/01/2018) 75 mL 12  . metoprolol succinate (TOPROL-XL) 25 MG 24 hr tablet Take 1 tablet by mouth daily.    . predniSONE (DELTASONE) 10 MG tablet Take 2 tablets for 3 days then take one tablet for 3 days (Patient not taking: Reported on 06/01/2018) 9 tablet 0  . silver sulfADIAZINE (SILVADENE) 1 % cream Apply 1 application topically daily as needed (skin irritation).     No current facility-administered medications on file prior to visit.     Social History   Socioeconomic History  . Marital status: Married    Spouse name: Not on file  . Number of children: Not on file  . Years of education: Not on file   . Highest education level: Not on file  Occupational History  . Not on file  Social Needs  . Financial resource strain: Not on file  . Food insecurity:    Worry: Not on file    Inability: Not on file  . Transportation needs:    Medical: Not on file    Non-medical: Not on file  Tobacco Use  . Smoking status: Never Smoker  . Smokeless tobacco: Never Used  . Tobacco comment: From spouse. Wife does not smoke around him now  Substance and Sexual Activity  . Alcohol use: No    Alcohol/week: 0.0 standard drinks  . Drug use: No  . Sexual activity: Not on file  Lifestyle  . Physical activity:  Days per week: Not on file    Minutes per session: Not on file  . Stress: Not on file  Relationships  . Social connections:    Talks on phone: Not on file    Gets together: Not on file    Attends religious service: Not on file    Active member of club or organization: Not on file    Attends meetings of clubs or organizations: Not on file    Relationship status: Not on file  . Intimate partner violence:    Fear of current or ex partner: Not on file    Emotionally abused: Not on file    Physically abused: Not on file    Forced sexual activity: Not on file  Other Topics Concern  . Not on file  Social History Narrative  . Not on file    Family History  Problem Relation Age of Onset  . Diabetes Sister   . Heart disease Sister   . Atrial fibrillation Sister   . Heart disease Other   . Diabetes Other   . Depression Maternal Grandfather     BP (!) 148/77   Pulse (!) 115   Ht 5' 10"  (1.778 m)   Wt 175 lb (79.4 kg)   BMI 25.11 kg/m   Body mass index is 25.11 kg/m.   All other systems reviewed and are negative.  The following is a summary of the past history medically, past history surgically, known current medicines, social history and family history.  This information is gathered electronically by the computer from prior information and documentation.  I review this each  visit and have found including this information at this point in the chart is beneficial and informative.    Past Medical History:  Diagnosis Date  . Adrenal nodule ([place]) 11/10/2011  . Arthritis   . Cancer ([place])    skin cancer  . Chronic low back pain   . Complication of anesthesia   . Diabetes mellitus   . GERD (gastroesophageal reflux disease)   . Hyperlipemia   . Hypertension   . PONV (postoperative nausea and vomiting)   . 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 BIOPSY  01/16/2012   Procedure: BIOPSY;  Surgeon: Rogene Houston, MD;  [facility];  Service: Endoscopy;  Laterality: N/A;  . CATARACT EXTRACTION Right 7/06  . CATARACT EXTRACTION W/PHACO Left 01/05/2014   Procedure: CATARACT EXTRACTION PHACO AND INTRAOCULAR LENS PLACEMENT (IOC);  Surgeon: Tonny Branch, MD;  Location[facility];  Service: Ophthalmology;  Laterality: Left;  CDE:15.98  . COLONOSCOPY  01/29/12   abnormal repeat in one year  . ESOPHAGOGASTRODUODENOSCOPY    . KNEE ARTHROSCOPY     2 right knee surgeries and 1 left knee surgery  . RIGHT/LEFT HEART CATH AND CORONARY ANGIOGRAPHY N/A 11/25/2017   Procedure: RIGHT/LEFT HEART CATH AND CORONARY ANGIOGRAPHY;  Surgeon: Burnell Blanks, MD;  Location: [facility];  Service: Cardiovascular;  Laterality: N/A;  . TEE WITHOUT CARDIOVERSION N/A 12/22/2017   Procedure: TRANSESOPHAGEAL ECHOCARDIOGRAM (TEE);  Surgeon: Burnell Blanks, MD;  Location: [facility];  Service: Open Heart Surgery;  Laterality: N/A;  . TONSILLECTOMY    . TRANSCATHETER AORTIC VALVE REPLACEMENT, TRANSFEMORAL N/A 12/22/2017   Procedure: TRANSCATHETER AORTIC VALVE REPLACEMENT, TRANSFEMORAL;  Surgeon: Burnell Blanks, MD;  Location:  [facility];  Service: Open Heart Surgery;  Laterality: N/A;  . VASECTOMY       Family History  Problem Relation Age of Onset  . Diabetes Sister   . Heart disease Sister   . Atrial fibrillation Sister   . Heart disease Other   . Diabetes Other   . Depression Maternal Grandfather     Social History Social History   Tobacco Use  . Smoking status: Never Smoker  . Smokeless tobacco: Never Used  . Tobacco comment: From spouse. Wife does not smoke around him now  Substance Use Topics  . Alcohol use: No    Alcohol/week: 0.0 standard drinks  . Drug use: No    Allergies  Allergen Reactions  . Cortisone Other (See Comments)    swelling  . Tetanus Toxoids Other (See Comments)    Swelling, not specified  . Hydrocodone-Homatropine Nausea And Vomiting    Current Outpatient Medications  Medication Sig Dispense Refill  . ACCU-CHEK AVIVA PLUS test strip USE TO TEST SUGAR TWICE DAILY 150 each 1  . ACCU-CHEK AVIVA PLUS test strip USE TO TEST SUGAR TWICE DAILY 100 each 5  . aspirin EC 81 MG tablet Take 81 mg by mouth daily.    . benzonatate (TESSALON) 200 MG capsule One three times daily as needed (Patient taking differently: Take 200 mg by mouth 3 (three) times daily as needed for cough. One three times daily as needed) 30 capsule 12  . benzonatate (TESSALON) 200 MG capsule Take 1 capsule (200 mg total) by mouth 3 (three) times daily as needed for cough. 30 capsule 0  . blood glucose meter kit and supplies KIT Dispense based on patient and insurance preference. Use to test glucose twice daily. (For ICD 10 code E11.9) 1 each 5  . cetirizine (ZYRTEC) 10 MG tablet Take 10 mg by mouth daily.    . clopidogrel (PLAVIX) 75 MG tablet Take 1 tablet (75 mg total) by mouth daily with breakfast. 90 tablet 1  . diphenoxylate-atropine (LOMOTIL) 2.5-0.025 MG tablet TAKE 1 TABLET BY MOUTH FOUR TIMES DAILY AS NEEDED FOR DIARRHEA OR LOOSE STOOLS 30 tablet 3  . empagliflozin (JARDIANCE) 25 MG TABS tablet Take 25 mg by mouth daily. 30 tablet 5  . glipiZIDE (GLUCOTROL) 5 MG tablet  TAKE 1/2 TABLET BY MOUTH TWICE DAILY BEFORE A MEAL. 90 tablet 0  . glucose blood (ACCU-CHEK AVIVA PLUS) test strip TEST 2 TIMES A DAY AS DIRECTED. 100 each 5  . Insulin Glargine (LANTUS SOLOSTAR) 100 UNIT/ML Solostar Pen INJECT UP TO 30 UNITS INTO SKIN EVERY NIGHT AT BEDTIME AS DIRECTED BY DOCTOR 30 mL 3  . Insulin Pen Needle (B-D ULTRAFINE III SHORT PEN) 31G X 8 MM MISC Use once daily 300 each 0  . lovastatin (MEVACOR) 20 MG tablet TAKE 1 TABLET(20 MG) BY MOUTH AT BEDTIME 90 tablet 1  . ondansetron (ZOFRAN) 4 MG tablet Take 4 mg by mouth every 8 (eight) hours as needed for nausea or vomiting.    . pantoprazole (PROTONIX) 40 MG tablet Take 1 tablet (40 mg total) by mouth daily. 90 tablet 3  . triamcinolone cream (KENALOG) 0.1 % Apply 1 application topically 2 (two) times daily as needed. 45 g 4  . albuterol (PROVENTIL) (2.5 MG/3ML) 0.083% nebulizer solution Take 3 mLs (2.5 mg total) by nebulization every 4 (four) hours as needed for wheezing. (Patient not taking: Reported on 06/01/2018) 75 mL 12  . metoprolol succinate (TOPROL-XL) 25 MG 24 hr tablet Take 1  tablet by mouth daily.    . predniSONE (DELTASONE) 10 MG tablet Take 2 tablets for 3 days then take one tablet for 3 days (Patient not taking: Reported on 06/01/2018) 9 tablet 0  . silver sulfADIAZINE (SILVADENE) 1 % cream Apply 1 application topically daily as needed (skin irritation).     No current facility-administered medications for this visit.      Physical Exam  Blood pressure (!) 148/77, pulse (!) 115, height 5' 10"  (1.778 m), weight 175 lb (79.4 kg).  Constitutional: overall normal hygiene, normal nutrition, well developed, normal grooming, normal body habitus. Assistive device:cane  Musculoskeletal: gait and station Limp right, muscle tone and strength are normal, no tremors or atrophy is present.  .  Neurological: coordination overall normal.  Deep tendon reflex/nerve stretch intact.  Sensation normal.  Cranial nerves II-XII  intact.   Skin:   Normal overall no scars, lesions, ulcers or rashes. No psoriasis.  Psychiatric: Alert and oriented x 3.  Recent memory intact, remote memory unclear.  Normal mood and affect. Well groomed.  Good eye contact.  Cardiovascular: overall no swelling, no varicosities, no edema bilaterally, normal temperatures of the legs and arms, no clubbing, cyanosis and good capillary refill.  Lymphatic: palpation is normal.  Right knee has effusion, ROM 0 to 105 with crepitus, pain medially, limp to the right, NV intact.  Knee is stable.   All other systems reviewed and are negative   The patient has been educated about the nature of the problem(s) and counseled on treatment options.  The patient appeared to understand what I have discussed and is in agreement with it.  Encounter Diagnosis  Name Primary?  . Chronic pain of right knee Yes    PROCEDURE NOTE:  The patient requests injections of the right knee , verbal consent was obtained.  The right knee was prepped appropriately after time out was performed.   Sterile technique was observed and injection of 1 cc of Depo-Medrol 40 mg with several cc's of plain xylocaine. Anesthesia was provided by ethyl chloride and a 20-gauge needle was used to inject the knee area. The injection was tolerated well.  A band aid dressing was applied.  The patient was advised to apply ice later today and tomorrow to the injection sight as needed.  PLAN Call if any problems.  Precautions discussed.  Continue current medications.   Return to clinic 2 weeks   Electronically Signed Sanjuana Kava, MD 12/31/20199:07 AM

## 2018-06-01 NOTE — Telephone Encounter (Signed)
Call received from patient's designated party contact, granddaughter Freddi Starr [phone], asking for nurse - as to whether patient may benefit from crutches or a walker. Also asking if any other non-narcotic medication can be prescribed for his pain, other than ibuprofen. Uses [employer].  Please advise.

## 2018-06-02 DIAGNOSIS — Z9889 Other specified postprocedural states: Secondary | ICD-10-CM

## 2018-06-02 HISTORY — DX: Other specified postprocedural states: Z98.890

## 2018-06-03 NOTE — Telephone Encounter (Signed)
Granddaughter given information, no further questions or concerns at this time.

## 2018-06-03 NOTE — Telephone Encounter (Signed)
Please advise 

## 2018-06-03 NOTE — Telephone Encounter (Signed)
He can use crutches or walker, no problem.  Aleve or Tylenol.

## 2018-06-04 ENCOUNTER — Other Ambulatory Visit: Payer: Self-pay | Admitting: Family Medicine

## 2018-06-04 ENCOUNTER — Telehealth: Payer: Self-pay | Admitting: Family Medicine

## 2018-06-04 MED ORDER — OXYCODONE-ACETAMINOPHEN 5-325 MG PO TABS: 1.0000 | ORAL_TABLET | ORAL | 0 refills | Status: DC | PRN

## 2018-06-04 NOTE — Telephone Encounter (Signed)
Pt's granddaughter Jesse Cordova (on [place]) calling to see if Dr. Nicki Reaper could prescribe a pain medication to help the patient until his oncologist is back in office. She has contacted there office but with the doctor not in they are unable to order any pain medication for him.   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 He has been taking Aleve one in the am and once at night and tylenol every 4 hours for pain and it is not helping at all. Oxycodone is to strong for the patient and makes him sick. She is hoping something a little stronger than the aleve and tylenol can be called in to [place], [place].   Her call back number is [phone].   He has also lost 6 pounds in a week and does have his PET scan scheduled for 06/14/18.

## 2018-06-04 NOTE — Telephone Encounter (Signed)
Nurses Please talk with [name] I am not opposed to sending in pain medication for him Clarify pharmacy According to his medication list and allergy list hydrocodone cause nausea and vomiting He has taken tramadol before but tramadol is a weak pain medicine it may not help a lot  I can prescribe Percocet 5 mg that he could use 1 every 4 hours as needed for pain Certainly any pain medicine can cause some drowsiness It is okay to start off with 1/2 tablet if necessary to see how he does with the pain medicine If the pain medicine does not do enough we will need to use a stronger dose Winded they anticipate him being seen by his oncologist? Essentially I can send in a short prescription if they need an additional prescription they can connect with [place]  Please find out this info then let me know we could send it in today

## 2018-06-04 NOTE — Telephone Encounter (Signed)
Please inform family I sent in the pain medicine to [facility] They should call apothecary to request that they go ahead and fill the prescription May take a half a tablet to a maximum of a full tablet every 4 hours as needed for pain If having vomiting with the medicine or any other bad side effects stop the medicine Hopefully pain control will allow him to get around better If having ongoing troubles to let us know Certainly we are more than willing to see him at any point in time as well

## 2018-06-04 NOTE — Telephone Encounter (Signed)
Contacted [facility]; states that Percocet 5 mg would be ok to send in. Informed granddaughter that any pain med could cause some drowsiness. Informed her that she could start off with half tablet. Pt sees oncologist on Jan 15; has PET scan on Jan 13. Granddaughter states that oncologist is out of town as to why he will not prescribe the pain med; granddaughter states that after the visit with oncology, oncologist will prescribe. Granddaughter would like to know if it is ok to take Aleve and Tylenol along with Percocet for breakthrough pain. Currently she is giving him 2 aleve in the a.m. and one tylenol in the evening. grandaugher states pt is unable to walk. Yesterday it took him 30 minutes to take 2 steps. Please advise. Can send my chart or leave detailed message. Please advise. [employer].

## 2018-06-04 NOTE — Telephone Encounter (Signed)
Please advise. Thank you

## 2018-06-04 NOTE — Telephone Encounter (Signed)
Advised granddaughter (DPR) Dr Nicki Reaper sent in the pain medicine to [facility] They should call apothecary to request that they go ahead and fill the prescription May take a half a tablet to a maximum of a full tablet every 4 hours as needed for pain If having vomiting with the medicine or any other bad side effects stop the medicine Hopefully pain control will allow him to get around better If having ongoing troubles to let us know Certainly we are more than willing to see him at any point in time as well. Granddaughter verbalized understanding.

## 2018-06-05 ENCOUNTER — Inpatient Hospital Stay (HOSPITAL_COMMUNITY)
Admission: EM | Admit: 2018-06-05 | Discharge: 2018-06-05 | DRG: 536 | Disposition: A | Discharge status: Other Acute Inpatient Hospital | Payer: Medicare Other | Attending: Nephrology | Admitting: Nephrology

## 2018-06-05 ENCOUNTER — Emergency Department (HOSPITAL_COMMUNITY): Payer: Medicare Other

## 2018-06-05 ENCOUNTER — Encounter (HOSPITAL_COMMUNITY): Payer: Self-pay | Admitting: Emergency Medicine

## 2018-06-05 ENCOUNTER — Inpatient Hospital Stay (HOSPITAL_COMMUNITY): Payer: Medicare Other

## 2018-06-05 ENCOUNTER — Other Ambulatory Visit: Payer: Self-pay

## 2018-06-05 DIAGNOSIS — Z888 Allergy status to other drugs, medicaments and biological substances status: Secondary | ICD-10-CM

## 2018-06-05 DIAGNOSIS — S72001A Fracture of unspecified part of neck of right femur, initial encounter for closed fracture: Principal | ICD-10-CM

## 2018-06-05 DIAGNOSIS — C4490 Unspecified malignant neoplasm of skin, unspecified: Secondary | ICD-10-CM | POA: Diagnosis present

## 2018-06-05 DIAGNOSIS — Z7982 Long term (current) use of aspirin: Secondary | ICD-10-CM | POA: Diagnosis not present

## 2018-06-05 DIAGNOSIS — I2510 Atherosclerotic heart disease of native coronary artery without angina pectoris: Secondary | ICD-10-CM | POA: Diagnosis present

## 2018-06-05 DIAGNOSIS — Y92009 Unspecified place in unspecified non-institutional (private) residence as the place of occurrence of the external cause: Secondary | ICD-10-CM

## 2018-06-05 DIAGNOSIS — Z79899 Other long term (current) drug therapy: Secondary | ICD-10-CM | POA: Diagnosis not present

## 2018-06-05 DIAGNOSIS — E785 Hyperlipidemia, unspecified: Secondary | ICD-10-CM | POA: Diagnosis present

## 2018-06-05 DIAGNOSIS — Z952 Presence of prosthetic heart valve: Secondary | ICD-10-CM

## 2018-06-05 DIAGNOSIS — E119 Type 2 diabetes mellitus without complications: Secondary | ICD-10-CM | POA: Diagnosis not present

## 2018-06-05 DIAGNOSIS — Z953 Presence of xenogenic heart valve: Secondary | ICD-10-CM

## 2018-06-05 DIAGNOSIS — W19XXXA Unspecified fall, initial encounter: Secondary | ICD-10-CM

## 2018-06-05 DIAGNOSIS — Z7951 Long term (current) use of inhaled steroids: Secondary | ICD-10-CM

## 2018-06-05 DIAGNOSIS — E118 Type 2 diabetes mellitus with unspecified complications: Secondary | ICD-10-CM

## 2018-06-05 DIAGNOSIS — K219 Gastro-esophageal reflux disease without esophagitis: Secondary | ICD-10-CM | POA: Diagnosis not present

## 2018-06-05 DIAGNOSIS — R079 Chest pain, unspecified: Secondary | ICD-10-CM | POA: Diagnosis not present

## 2018-06-05 DIAGNOSIS — Z885 Allergy status to narcotic agent status: Secondary | ICD-10-CM

## 2018-06-05 DIAGNOSIS — I10 Essential (primary) hypertension: Secondary | ICD-10-CM | POA: Diagnosis not present

## 2018-06-05 DIAGNOSIS — Z794 Long term (current) use of insulin: Secondary | ICD-10-CM | POA: Diagnosis not present

## 2018-06-05 HISTORY — DX: Merkel cell carcinoma of unspecified part of face: C4A.30

## 2018-06-05 LAB — BASIC METABOLIC PANEL
Anion gap: 8 (ref 5–15)
Anion gap: 10 (ref 5–15)
BUN: 29 mg/dL — ABNORMAL HIGH (ref 8–23)
BUN: 25 mg/dL — ABNORMAL HIGH (ref 8–23)
CO2: 18 mmol/L — ABNORMAL LOW (ref 22–32)
CO2: 18 mmol/L — ABNORMAL LOW (ref 22–32)
Calcium: 8.6 mg/dL — ABNORMAL LOW (ref 8.9–10.3)
Calcium: 8.9 mg/dL (ref 8.9–10.3)
Chloride: 107 mmol/L (ref 98–111)
Chloride: 108 mmol/L (ref 98–111)
Creatinine, Ser: 1.16 mg/dL (ref 0.61–1.24)
Creatinine, Ser: 1.03 mg/dL (ref 0.61–1.24)
GFR calc Af Amer: >60 mL/min (ref >60)
GFR calc non Af Amer: >60 mL/min (ref >60)
GFR, EST NON AFRICAN AMERICAN: 60 mL/min — AB (ref >60)
GLUCOSE: 150 mg/dL — AB (ref 70–99)
Glucose, Bld: 119 mg/dL — ABNORMAL HIGH (ref 70–99)
Potassium: 3.6 mmol/L (ref 3.5–5.1)
Potassium: 3.6 mmol/L (ref 3.5–5.1)
Sodium: 133 mmol/L — ABNORMAL LOW (ref 135–145)
Sodium: 136 mmol/L (ref 135–145)

## 2018-06-05 LAB — CBC WITH DIFFERENTIAL/PLATELET
ABS IMMATURE GRANULOCYTES: 0.05 10*3/uL (ref 0.00–0.07)
Abs Immature Granulocytes: 0.04 10*3/uL (ref 0.00–0.07)
Basophils Absolute: 0.1 10*3/uL (ref 0.0–0.1)
Basophils Absolute: 0.1 10*3/uL (ref 0.0–0.1)
Basophils Relative: 1 %
Basophils Relative: 1 %
EOS ABS: 0.1 10*3/uL (ref 0.0–0.5)
EOS ABS: 0.1 10*3/uL (ref 0.0–0.5)
Eosinophils Relative: 1 %
Eosinophils Relative: 2 %
HCT: 42.9 % (ref 39.0–52.0)
HCT: 43.1 % (ref 39.0–52.0)
Hemoglobin: 14 g/dL (ref 13.0–17.0)
Hemoglobin: 13.7 g/dL (ref 13.0–17.0)
Immature Granulocytes: 1 %
Immature Granulocytes: 1 %
LYMPHS ABS: 1.3 10*3/uL (ref 0.7–4.0)
Lymphocytes Relative: 12 %
Lymphocytes Relative: 19 %
Lymphs Abs: 1.6 10*3/uL (ref 0.7–4.0)
MCH: 29.4 pg (ref 26.0–34.0)
MCH: 28.7 pg (ref 26.0–34.0)
MCHC: 32.6 g/dL (ref 30.0–36.0)
MCHC: 31.8 g/dL (ref 30.0–36.0)
MCV: 89.9 fL (ref 80.0–100.0)
MCV: 90.4 fL (ref 80.0–100.0)
MONO ABS: 0.9 10*3/uL (ref 0.1–1.0)
MONOS PCT: 8 %
MONOS PCT: 10 %
Monocytes Absolute: 0.8 10*3/uL (ref 0.1–1.0)
NEUTROS ABS: 8.1 10*3/uL — AB (ref 1.7–7.7)
NEUTROS PCT: 77 %
NRBC: 0 % (ref 0.0–0.2)
Neutro Abs: 5.9 10*3/uL (ref 1.7–7.7)
Neutrophils Relative %: 67 %
Platelets: 187 10*3/uL (ref 150–400)
Platelets: 188 10*3/uL (ref 150–400)
RBC: 4.77 MIL/uL (ref 4.22–5.81)
RBC: 4.77 MIL/uL (ref 4.22–5.81)
RDW: 13.4 % (ref 11.5–15.5)
RDW: 13.3 % (ref 11.5–15.5)
WBC: 10.5 10*3/uL (ref 4.0–10.5)
WBC: 8.6 10*3/uL (ref 4.0–10.5)
nRBC: 0 % (ref 0.0–0.2)

## 2018-06-05 LAB — CBG MONITORING, ED
Glucose-Capillary: 99 mg/dL (ref 70–99)
Glucose-Capillary: 84 mg/dL (ref 70–99)

## 2018-06-05 LAB — TROPONIN I
Troponin I: <0.03 ng/mL (ref <0.03)
Troponin I: <0.03 ng/mL (ref <0.03)

## 2018-06-05 MED ORDER — ACETAMINOPHEN 325 MG PO TABS: 650.0000 mg | ORAL_TABLET | Freq: Four times a day (QID) | ORAL | Status: DC | PRN

## 2018-06-05 MED ORDER — METOPROLOL SUCCINATE ER 25 MG PO TB24: 25.0000 mg | ORAL_TABLET | Freq: Every day | ORAL | 0 refills | Status: DC

## 2018-06-05 MED ORDER — PANTOPRAZOLE SODIUM 40 MG PO TBEC
40.0000 mg | DELAYED_RELEASE_TABLET | Freq: Every day | ORAL | Status: DC
  Administered 2018-06-05: 40 mg via ORAL
  Filled 2018-06-05: qty 1

## 2018-06-05 MED ORDER — MORPHINE SULFATE (PF) 4 MG/ML IV SOLN
4.0000 mg | Freq: Once | INTRAVENOUS | Status: AC
  Administered 2018-06-05: 4 mg via INTRAVENOUS
  Filled 2018-06-05: qty 1

## 2018-06-05 MED ORDER — INSULIN ASPART 100 UNIT/ML ~~LOC~~ SOLN: 0.0000 [IU] | SUBCUTANEOUS | 11 refills | Status: DC

## 2018-06-05 MED ORDER — ONDANSETRON HCL 4 MG/2ML IJ SOLN
4.0000 mg | Freq: Four times a day (QID) | INTRAMUSCULAR | Status: DC | PRN
  Administered 2018-06-05: 4 mg via INTRAVENOUS
  Filled 2018-06-05: qty 2

## 2018-06-05 MED ORDER — ACETAMINOPHEN 650 MG RE SUPP: 650.0000 mg | Freq: Four times a day (QID) | RECTAL | Status: DC | PRN

## 2018-06-05 MED ORDER — ASPIRIN EC 81 MG PO TBEC
81.0000 mg | DELAYED_RELEASE_TABLET | Freq: Every day | ORAL | Status: DC
  Administered 2018-06-05: 81 mg via ORAL
  Filled 2018-06-05: qty 1

## 2018-06-05 MED ORDER — ONDANSETRON HCL 4 MG/2ML IJ SOLN: 4.0000 mg | Freq: Four times a day (QID) | INTRAMUSCULAR | 0 refills | Status: DC | PRN

## 2018-06-05 MED ORDER — ACETAMINOPHEN 325 MG PO TABS: 650.0000 mg | ORAL_TABLET | Freq: Four times a day (QID) | ORAL | 0 refills | Status: DC | PRN

## 2018-06-05 MED ORDER — ONDANSETRON HCL 4 MG PO TABS: 4.0000 mg | ORAL_TABLET | Freq: Four times a day (QID) | ORAL | 0 refills | Status: DC | PRN

## 2018-06-05 MED ORDER — INSULIN ASPART 100 UNIT/ML ~~LOC~~ SOLN: 0.0000 [IU] | SUBCUTANEOUS | Status: DC

## 2018-06-05 MED ORDER — ACETAMINOPHEN 650 MG RE SUPP: 650.0000 mg | Freq: Four times a day (QID) | RECTAL | 0 refills | Status: DC | PRN

## 2018-06-05 MED ORDER — MORPHINE SULFATE (PF) 2 MG/ML IV SOLN
2.0000 mg | INTRAVENOUS | Status: DC | PRN
  Administered 2018-06-05 (×3): 2 mg via INTRAVENOUS
  Filled 2018-06-05 (×3): qty 1

## 2018-06-05 MED ORDER — ONDANSETRON HCL 4 MG PO TABS: 4.0000 mg | ORAL_TABLET | Freq: Four times a day (QID) | ORAL | Status: DC | PRN

## 2018-06-05 MED ORDER — OXYCODONE HCL 5 MG PO TABS
5.0000 mg | ORAL_TABLET | Freq: Four times a day (QID) | ORAL | Status: DC | PRN
  Administered 2018-06-05: 5 mg via ORAL
  Filled 2018-06-05: qty 1

## 2018-06-05 MED ORDER — METOPROLOL SUCCINATE ER 25 MG PO TB24
25.0000 mg | ORAL_TABLET | Freq: Every day | ORAL | Status: DC
  Administered 2018-06-05: 25 mg via ORAL
  Filled 2018-06-05: qty 1

## 2018-06-05 NOTE — ED Notes (Signed)
Meal provided   awaiting acceptance at another facility and transfer

## 2018-06-05 NOTE — ED Notes (Signed)
CT has been teled to [facility] but they will also provide a CT from [facility]

## 2018-06-05 NOTE — Progress Notes (Signed)
On review of CT scan of the right hip, there is high index of suspicion for pathologic right peritrochanteric femur fracture.  In light of this I would recommend transfer of care to a center that has orthopedic oncology to appropriately work up and manage this fracture.

## 2018-06-05 NOTE — ED Notes (Signed)
Call to [facility] [phone]  Report to [facility]

## 2018-06-05 NOTE — ED Triage Notes (Addendum)
Pt from home after suffering a fall. Unable to bear weight on R leg. C/o 9/10 R hip pain. EMS gave pt 82mcg Fentanyl en route. Pt states he has been getting around at home using his wife's crutches because he has been having pain in his R leg and hip when he goes to stand. States pain is now much worse since fall.

## 2018-06-05 NOTE — ED Provider Notes (Signed)
[facility] EMERGENCY DEPARTMENT Provider Note   CSN: [id number] Arrival date & time: 06/05/18  0136     History   Chief Complaint Chief Complaint  Patient presents with  . Fall    HPI Jesse Cordova is a 78 y.o. male.  Patient presents with right hip pain after a fall.  States he was trying to get to the bed when his right leg "gave out" and he fell onto his right side.  Denies hitting head or losing consciousness.  Complains of pain to his right lateral hip that radiates down his leg.  He saw Dr. Luna Glasgow for ongoing right knee pain and had an injection last week.  He states this is what caused him to fall.  He denies hitting his head or losing consciousness.  He does take Plavix.  No other blood thinners.  History of aortic valve replacement, hyperlipidemia, hypertension, diabetes and skin cancer.  Denies any neck or back pain.  No chest pain or abdominal pain.  No weakness, numbness or tingling in the leg.  The history is provided by the patient and the EMS personnel.  Fall  Pertinent negatives include no chest pain, no abdominal pain, no headaches and no shortness of breath.    Past Medical History:  Diagnosis Date  . 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 Arthritis   . Cancer ([place])    skin cancer  . Chronic low back pain   . Complication of anesthesia   . Diabetes mellitus   . GERD (gastroesophageal reflux disease)   . Hyperlipemia   . Hypertension   . PONV (postoperative nausea and vomiting)   . 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 Severe aortic stenosis 12/22/2017  . 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 Essential hypertension, benign 11/15/2012  . Hyperlipidemia 11/15/2012  . DM type 2 (diabetes mellitus, type  2) ([place]) 11/10/2011  . 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 BIOPSY  01/16/2012   Procedure: BIOPSY;  Surgeon: Rogene Houston, MD;  Location: [facility] ENDO SUITE;  Service: Endoscopy;  Laterality: N/A;  . CATARACT EXTRACTION Right 7/06  . CATARACT EXTRACTION W/PHACO Left 01/05/2014   Procedure: CATARACT EXTRACTION PHACO AND INTRAOCULAR LENS PLACEMENT (IOC);  Surgeon: Tonny Branch, MD;  Location: [facility] ORS;  Service: Ophthalmology;  Laterality: Left;  CDE:15.98  . COLONOSCOPY  01/29/12   abnormal repeat in one year  . ESOPHAGOGASTRODUODENOSCOPY    . KNEE ARTHROSCOPY     2 right knee surgeries and 1 left knee surgery  . RIGHT/LEFT HEART CATH AND CORONARY ANGIOGRAPHY N/A 11/25/2017   Procedure: RIGHT/LEFT HEART CATH AND CORONARY ANGIOGRAPHY;  Surgeon: Burnell Blanks, MD;  Location: [facility];  Service: Cardiovascular;  Laterality: N/A;  . TEE WITHOUT CARDIOVERSION N/A 12/22/2017   Procedure: TRANSESOPHAGEAL ECHOCARDIOGRAM (TEE);  Surgeon: Burnell Blanks, MD;  Location: [place];  Service: Open Heart Surgery;  Laterality: N/A;  . TONSILLECTOMY    . TRANSCATHETER AORTIC VALVE REPLACEMENT, TRANSFEMORAL N/A 12/22/2017   Procedure: TRANSCATHETER AORTIC VALVE REPLACEMENT, TRANSFEMORAL;  Surgeon: Burnell Blanks, MD;  Location: [place];  Service: Open Heart Surgery;  Laterality: N/A;  . VASECTOMY  Home Medications    Prior to Admission medications   Medication Sig Start Date End Date Taking? Authorizing Provider  ACCU-CHEK AVIVA PLUS test strip USE TO TEST SUGAR TWICE DAILY 09/03/16  Yes [name], MD  ACCU-CHEK AVIVA PLUS test strip USE TO TEST SUGAR TWICE DAILY 12/10/16  Yes [name], MD  aspirin EC 81 MG tablet Take 81 mg by mouth daily.   Yes [provider]  benzonatate (TESSALON) 200 MG capsule One three times daily as needed Patient taking differently: Take 200 mg by mouth 3 (three) times daily  as needed for cough. One three times daily as needed 01/22/17  Yes Luking, Elayne Snare, MD  benzonatate (TESSALON) 200 MG capsule Take 1 capsule (200 mg total) by mouth 3 (three) times daily as needed for cough. 04/30/18  Yes Mikey Kirschner, MD  blood glucose meter kit and supplies KIT Dispense based on patient and insurance preference. Use to test glucose twice daily. (For ICD 10 code E11.9) 08/19/17  Yes Luking, Elayne Snare, MD  cetirizine (ZYRTEC) 10 MG tablet Take 10 mg by mouth daily.   Yes [provider]  clopidogrel (PLAVIX) 75 MG tablet Take 1 tablet (75 mg total) by mouth daily with breakfast. 12/24/17  Yes Eileen Stanford, PA-C  diphenoxylate-atropine (LOMOTIL) 2.5-0.025 MG tablet TAKE 1 TABLET BY MOUTH FOUR TIMES DAILY AS NEEDED FOR DIARRHEA OR LOOSE STOOLS 06/12/16  Yes Luking, Scott A, MD  empagliflozin (JARDIANCE) 25 MG TABS tablet Take 25 mg by mouth daily. 09/09/17  Yes Luking, Scott A, MD  glipiZIDE (GLUCOTROL) 5 MG tablet TAKE 1/2 TABLET BY MOUTH TWICE DAILY BEFORE A MEAL. 03/19/18  Yes Luking, Scott A, MD  glucose blood (ACCU-CHEK AVIVA PLUS) test strip TEST 2 TIMES A DAY AS DIRECTED. 03/16/18  Yes Luking, Elayne Snare, MD  Insulin Glargine (LANTUS SOLOSTAR) 100 UNIT/ML Solostar Pen INJECT UP TO 30 UNITS INTO SKIN EVERY NIGHT AT BEDTIME AS DIRECTED BY DOCTOR 09/09/17  Yes Luking, Scott A, MD  Insulin Pen Needle (B-D ULTRAFINE III SHORT PEN) 31G X 8 MM MISC Use once daily 02/04/17  Yes Luking, Scott A, MD  lovastatin (MEVACOR) 20 MG tablet TAKE 1 TABLET(20 MG) BY MOUTH AT BEDTIME 09/09/17  Yes Luking, Scott A, MD  metoprolol succinate (TOPROL-XL) 25 MG 24 hr tablet Take 1 tablet by mouth daily. 01/08/18  Yes [provider]  ondansetron (ZOFRAN) 4 MG tablet Take 4 mg by mouth every 8 (eight) hours as needed for nausea or vomiting.   Yes [provider]  oxyCODONE-acetaminophen (PERCOCET/ROXICET) 5-325 MG tablet Take 1 tablet by mouth every 4 (four) hours as needed for up to  5 days. 06/04/18 06/09/18 Yes Luking, Elayne Snare, MD  pantoprazole (PROTONIX) 40 MG tablet Take 1 tablet (40 mg total) by mouth daily. 12/24/17  Yes Eileen Stanford, PA-C  predniSONE (DELTASONE) 10 MG tablet Take 2 tablets for 3 days then take one tablet for 3 days 03/10/18  Yes Luking, Elayne Snare, MD  silver sulfADIAZINE (SILVADENE) 1 % cream Apply 1 application topically daily as needed (skin irritation).   Yes [provider]  triamcinolone cream (KENALOG) 0.1 % Apply 1 application topically 2 (two) times daily as needed. 02/18/16  Yes Luking, Elayne Snare, MD  albuterol (PROVENTIL) (2.5 MG/3ML) 0.083% nebulizer solution Take 3 mLs (2.5 mg total) by nebulization every 4 (four) hours as needed for wheezing. Patient not taking: Reported on 06/01/2018 05/04/18   [name], MD  Family History Family History  Problem Relation Age of Onset  . Diabetes Sister   . Heart disease Sister   . Atrial fibrillation Sister   . Heart disease Other   . Diabetes Other   . Depression Maternal Grandfather     Social History Social History   Tobacco Use  . Smoking status: Never Smoker  . Smokeless tobacco: Never Used  . Tobacco comment: From spouse. Wife does not smoke around him now  Substance Use Topics  . Alcohol use: No    Alcohol/week: 0.0 standard drinks  . Drug use: No     Allergies   Cortisone; Tetanus toxoids; and Hydrocodone-homatropine   Review of Systems Review of Systems  Constitutional: Negative for activity change, appetite change and fever.  Respiratory: Negative for cough, chest tightness and shortness of breath.   Cardiovascular: Negative for chest pain and leg swelling.  Gastrointestinal: Negative for abdominal pain, nausea and vomiting.  Genitourinary: Negative for dysuria, hematuria and urgency.  Musculoskeletal: Positive for arthralgias and myalgias. Negative for back pain.  Neurological: Negative for dizziness, seizures and headaches.   all other systems are  negative except as noted in the HPI and PMH.     Physical Exam Updated Vital Signs BP (!) 155/73   Pulse 90   Temp 97.6 F (36.4 C) (Oral)   Ht 5' 10"  (1.778 m)   Wt 79.3 kg   SpO2 95%   BMI 25.08 kg/m   Physical Exam Vitals signs and nursing note reviewed.  Constitutional:      General: He is not in acute distress.    Appearance: He is well-developed.  HENT:     Head: Normocephalic and atraumatic.     Nose: Nose normal.     Mouth/Throat:     Mouth: Mucous membranes are moist.     Pharynx: No oropharyngeal exudate.  Eyes:     Conjunctiva/sclera: Conjunctivae normal.     Pupils: Pupils are equal, round, and reactive to light.  Neck:     Musculoskeletal: Normal range of motion and neck supple.     Comments: No meningismus. Cardiovascular:     Rate and Rhythm: Normal rate and regular rhythm.     Heart sounds: Normal heart sounds. No murmur.  Pulmonary:     Effort: Pulmonary effort is normal. No respiratory distress.     Breath sounds: Normal breath sounds.  Abdominal:     Palpations: Abdomen is soft.     Tenderness: There is no abdominal tenderness. There is no guarding or rebound.  Musculoskeletal: Normal range of motion.        General: No tenderness.     Comments: Abrasions bilateral elbows without bony tenderness  Right lateral hip tenderness.  No shortening or external rotation.  Intact DP and PT pulse.  No obvious deformity of right hip, thigh or knee.  Skin:    General: Skin is warm.     Capillary Refill: Capillary refill takes less than 2 seconds.  Neurological:     Mental Status: He is alert and oriented to person, place, and time.     Cranial Nerves: No cranial nerve deficit.     Motor: No abnormal muscle tone.     Coordination: Coordination normal.     Comments:  5/5 strength throughout. CN 2-12 intact.Equal grip strength.   Psychiatric:        Behavior: Behavior normal.      ED Treatments / Results  Labs (all labs ordered are listed, but  only abnormal results are displayed) Labs Reviewed  CBC WITH DIFFERENTIAL/PLATELET - Abnormal; Notable for the following components:      Result Value   Neutro Abs 8.1 (*)    All other components within normal limits  BASIC METABOLIC PANEL - Abnormal; Notable for the following components:   Sodium 133 (*)    CO2 18 (*)    Glucose, Bld 150 (*)    BUN 29 (*)    Calcium 8.6 (*)    GFR calc non Af Amer 60 (*)    All other components within normal limits  TROPONIN I    EKG EKG Interpretation  Date/Time:  Saturday June 05 2018 02:55:36 EST Ventricular Rate:  92 PR Interval:    QRS Duration: 93 QT Interval:  366 QTC Calculation: 453 R Axis:   -32 Text Interpretation:  Sinus rhythm Left ventricular hypertrophy Artifact in lead(s) I II aVR aVL No significant change was found Confirmed by Ezequiel Essex [phone]) on 06/05/2018 2:58:31 AM   Radiology Dg Knee 2 Views Right  Result Date: 06/05/2018 CLINICAL DATA:  Slip and fall landing on hardwood floor today, right hip pain radiating to the knee. EXAM: RIGHT KNEE - 1-2 VIEW COMPARISON:  Radiograph 06/01/2018 FINDINGS: No acute fracture or dislocation. Tricompartmental osteoarthritis with peripheral spurring. Marked medial tibiofemoral joint space narrowing. Chondrocalcinosis most prominent in the lateral tibiofemoral compartment. Intra-articular ossified bodies posteriorly in the knee joint. Trace joint effusion. IMPRESSION: Tricompartmental osteoarthritis without acute fracture or dislocation. Electronically Signed   By: Keith Rake M.D.   On: 06/05/2018 03:00   Dg Hip Unilat W Or Wo Pelvis 2-3 Views Right  Result Date: 06/05/2018 CLINICAL DATA:  Slip and fall landing on hardwood floor today, right hip pain radiating to the knee. EXAM: DG HIP (WITH OR WITHOUT PELVIS) 2-3V RIGHT COMPARISON:  None. FINDINGS: Mildly displaced transcervical fracture of the right femoral neck. Femoral head remains seated. No significant angulation. No  additional fracture. Pubic rami are intact. Pubic symphysis and sacroiliac joints are congruent. IMPRESSION: Mildly displaced transcervical right femoral neck fracture. Electronically Signed   By: Keith Rake M.D.   On: 06/05/2018 02:58   Dg Femur Min 2 Views Right  Result Date: 06/05/2018 CLINICAL DATA:  Slip and fall landing on hardwood floor today, right hip pain radiating to the knee. EXAM: RIGHT FEMUR 2 VIEWS COMPARISON:  None. FINDINGS: Mildly displaced transcervical femoral neck fracture. The distal femur is intact. No additional fracture. Degenerative change of the knee, better characterized on concurrent knee radiographs. IMPRESSION: Mildly displaced transcervical femoral neck fracture. Distal femur is intact. Electronically Signed   By: Keith Rake M.D.   On: 06/05/2018 02:59    Procedures Procedures (including critical care time)  Medications Ordered in ED Medications  morphine 4 MG/ML injection 4 mg (4 mg Intravenous Given 06/05/18 0202)     Initial Impression / Assessment and Plan / ED Course  I have reviewed the triage vital signs and the nursing notes.  Pertinent labs & imaging results that were available during my care of the patient were reviewed by me and considered in my medical decision making (see chart for details).    Fall with right hip pain.  Neurovascular intact.  Denies head injury.  Right leg is neurovascularly intact without obvious deformity or shortening.  While in x-ray department, patient developed central chest pain lasting for a few minutes at a time.  EKG is unchanged with sinus rhythm. First troponin is negative.  Heart catheterization in  June 2019 showed minimal nonobstructive CAD.  Xray with transverse R femoral neck fracture. D/w Dr. Stann Mainland of orthopedics who requests NPO and admission to hospitalist at [facility].  Chest pain has resolved.  Patient continues to be chest pain-free.  Discussed need for admission for hip fracture repair likely  tomorrow.  Will remain n.p.o.  Discussed with Dr. Stann Mainland of orthopedics.  Admission to [facility] discussed with Dr. Olevia Bowens.   Final Clinical Impressions(s) / ED Diagnoses   Final diagnoses:  Closed fracture of neck of right femur, initial encounter [facility])  Fall, initial encounter  Chest pain, unspecified type    ED Discharge Orders    None       Machele Deihl, Annie Main, MD 06/05/18 [phone]

## 2018-06-05 NOTE — ED Notes (Signed)
Awaiting acceptance at another facility and transfer

## 2018-06-05 NOTE — ED Notes (Signed)
Report to Marta Lamas, ED [facility]  Report thru [phone]

## 2018-06-05 NOTE — Discharge Summary (Signed)
Physician Discharge Summary  Patient ID: Jesse Cordova MRN: [id number] DOB/AGE: Nov 21, 1940 78 y.o.  Admit date: 06/05/2018 Discharge date: 06/05/2018  Admission Diagnoses: Principal Problem:   Fracture of femoral neck, right, closed ([place]) Active Problems:   DM type 2 (diabetes mellitus, type 2) ([place])   Essential hypertension, benign   Hyperlipidemia   S/P TAVR (transcatheter aortic valve replacement)   GERD (gastroesophageal reflux disease)   CAD (coronary artery disease)   Chest pain  Discharge Diagnoses:  Principal Problem:   Fracture of femoral neck, right, closed; possible pathological fracture Active Problems:   DM type 2 (diabetes mellitus, type 2) ([place])   Essential hypertension, benign   Hyperlipidemia   S/P TAVR (transcatheter aortic valve replacement)   GERD (gastroesophageal reflux disease)   CAD (coronary artery disease)   Chest pain   Discharged Condition: good  Presentation Summary: Jesse Cordova is a 78 y.o. male with medical history significant of adrenal nodule, osteoarthritis, skin cancer, chronic low back pain, type 2 diabetes, GERD, hyperlipidemia, hypertension, history of aortic stenosis, s/p TAVR who is coming to the emergency department with severe right hip pain after having an accidental fall at home.  He states he did not hit his head or lose consciousness.  He attributes this to having his knee buckled up and leading to the fall.  He denies headaches, sore throat, hemoptysis, wheezing, dyspnea, chest pain, dizziness, nausea/emesis/diaphoresis, PND, orthopnea, but occasionally gets lower extremity edema.  He denies abdominal pain, diarrhea, constipation, melena, hematochezia, dysuria, frequency or hematuria.  No polyuria, polydipsia, polyphagia or blurred vision. ED Course: Initial vital signs temperature 97.6 F, pulse 92, respirations 13, blood pressure 155/84 mmHg O2 sat 97% on room air.  The patient was given morphine 4 mg IVP in the emergency department.   While in the ER, the patient had an episode of chest pain.  However, he denies any chest pain at this time. His CBC was normal with a white count of 10.5, hemoglobin 14.0 g/dL and platelets 187.  Sodium 133, potassium 3.6, chloride 107 and CO2 18 mmol/L.  BUN 29, creatinine 1.16, calcium 8.6 and glucose 150 mg/dL.  Troponin was normal.  EKG was normal sinus rhythm with electrocardiographic evidence of LVH.  Imaging: Femur and hip radiograph shows mildly displaced transcervical femoral neck fracture.  Distal femur is intact.  His chest radiograph did not have any acute finding.  Right knee x-ray showed tricompartmental osteoarthritis, but no acute findings.  See images and full radiology report for further detail.   Hospital Course/ Problems:  Principal Problem:   Fracture of femoral neck, right, closed ([place]) - per CT scan report multiple lucencies which are new in the cortex ofthe right femoral neck and intertrochanteric right femur which arenew since the comparison examination and not present on the left raise the possibility of a pathologic fracture - ED MD spoke w/ orthopedics at [facility] who suggested referral to a tertiary care center given likelihood of pathologic fracture. Pt has a known skin cancer (Merckel cell) and is f/b [facility].  Dr Lita Mains spoke w/ [facility] who accepted patient for transfer. [facility] is on their way for transfer. Pt is stable for transfer.      Chest pain- recent L/R heart cath in June 2019, pre TAVR w/u ,showed Prox RCA 20% lesion and 10% LAD lesion.  No severe disease.   - telemetry monitoring.  - trend troponin level.  - continue beta-blocker.    S/P TAVR (transcatheter aortic valve replacement) No signs  of decompensation. Plavix and aspirin held in anticipation of surgery. Resume statin after surgical procedure.    DM type 2 (diabetes mellitus, type 2) (HCC) Currently n.p.o. Hold Lantus and Jardiance. SSI q 4hrs ordered    Essential hypertension, benign Continue  metoprolol XL 25 mg p.o. daily. Monitor BP and HR.    Hyperlipidemia Resume statin after surgery.    GERD (gastroesophageal reflux disease) Protonix 40 mg p.o. daily.    DVT prophylaxis: SCDs. Code Status: Full code. Family Communication: His daughters and granddaughter were present in the ED room. Disposition Plan: Transferring this afternoon to [facility] Admission status: Inpatient/telemetry.     Discharge Exam: Blood pressure 123/69, pulse 81, temperature 97.9 F (36.6 C), resp. rate 15, height '5\' 10"'$  (1.778 m), weight 79.3 kg, SpO2 97 %. Constitutional: NAD, calm, comfortable Eyes: PERRL, lids and conjunctivae normal ENMT: Mucous membranes are moist. Posterior pharynx clear of any exudate or lesions. Neck: normal, supple, no masses, no thyromegaly Respiratory: clear to auscultation bilaterally, no wheezing, no crackles. Normal respiratory effort. No accessory muscle use.  Cardiovascular: Regular rate and rhythm, no murmurs / rubs / gallops. No extremity edema. 2+ pedal pulses. No carotid bruits.  Abdomen: Soft, no tenderness, no masses palpated. No hepatosplenomegaly. Bowel sounds positive.  Musculoskeletal: no clubbing / cyanosis.  Positive right hip tenderness.  Severely decreased hip and RLE in general ROM, no contractures. Normal muscle tone.  Skin: Bilateral elbow abrasions.  His face is mildly erythematous.  Right axillary area lesion. Neurologic: CN 2-12 grossly intact. Sensation intact, DTR normal. Strength 5/5 in all 4.  Psychiatric: Normal judgment and insight. Alert and oriented x 3. Normal mood.    Disposition: Discharge disposition: [place] Not Defined        Allergies as of 06/05/2018      Reactions   Cortisone Other (See Comments)   swelling   Tetanus Toxoids Other (See Comments)   Swelling, not specified   Hydrocodone-homatropine Nausea And Vomiting      Medication List    STOP taking these medications    ACCU-CHEK AVIVA PLUS test strip Generic drug:  glucose blood   albuterol (2.5 MG/3ML) 0.083% nebulizer solution Commonly known as:  PROVENTIL   aspirin EC 81 MG tablet   benzonatate 200 MG capsule Commonly known as:  TESSALON   blood glucose meter kit and supplies Kit   cetirizine 10 MG tablet Commonly known as:  ZYRTEC   clopidogrel 75 MG tablet Commonly known as:  PLAVIX   diphenoxylate-atropine 2.5-0.025 MG tablet Commonly known as:  LOMOTIL   empagliflozin 25 MG Tabs tablet Commonly known as:  JARDIANCE   glipiZIDE 5 MG tablet Commonly known as:  GLUCOTROL   glucose blood test strip Commonly known as:  ACCU-CHEK AVIVA PLUS   Insulin Glargine 100 UNIT/ML Solostar Pen Commonly known as:  LANTUS SOLOSTAR   Insulin Pen Needle 31G X 8 MM Misc Commonly known as:  B-D ULTRAFINE III SHORT PEN   lovastatin 20 MG tablet Commonly known as:  MEVACOR   naproxen sodium 220 MG tablet Commonly known as:  ALEVE   oxyCODONE-acetaminophen 5-325 MG tablet Commonly known as:  PERCOCET/ROXICET   pantoprazole 40 MG tablet Commonly known as:  PROTONIX   silver sulfADIAZINE 1 % cream Commonly known as:  SILVADENE   triamcinolone cream 0.1 % Commonly known as:  KENALOG     TAKE these medications   acetaminophen 325 MG tablet Commonly known as:  TYLENOL Take 2 tablets (  650 mg total) by mouth every 6 (six) hours as needed for mild pain (or Fever >/= 101). What changed:    medication strength  how much to take  reasons to take this   acetaminophen 650 MG suppository Commonly known as:  TYLENOL Place 1 suppository (650 mg total) rectally every 6 (six) hours as needed for mild pain (or Fever >/= 101). What changed:  You were already taking a medication with the same name, and this prescription was added. Make sure you understand how and when to take each.   insulin aspart 100 UNIT/ML injection Commonly known as:  novoLOG Inject 0-15 Units into the skin every 4 (four)  hours.   metoprolol succinate 25 MG 24 hr tablet Commonly known as:  TOPROL-XL Take 1 tablet (25 mg total) by mouth daily. Start taking on:  June 06, 2018   ondansetron 4 MG tablet Commonly known as:  ZOFRAN Take 1 tablet (4 mg total) by mouth every 6 (six) hours as needed for nausea. What changed:    when to take this  reasons to take this   ondansetron 4 MG/2ML Soln injection Commonly known as:  ZOFRAN Inject 2 mLs (4 mg total) into the vein every 6 (six) hours as needed for nausea. What changed:  You were already taking a medication with the same name, and this prescription was added. Make sure you understand how and when to take each.        Signed: Sandy Salaam Crist Kruszka 06/05/2018, 1:41 PM

## 2018-06-05 NOTE — ED Notes (Signed)
Dr. Charlies Constable at [facility] returned call to Dr. Lita Mains

## 2018-06-05 NOTE — ED Notes (Signed)
Per staff report and family, pt is allowed lunch  ordered

## 2018-06-05 NOTE — ED Notes (Signed)
Spoke with Cassie will get a truck to transport pt to [facility] ED.

## 2018-06-05 NOTE — ED Notes (Signed)
Per family, the pt is supposed to be transferred to [facility] hospital  Per report, pt is to be transferred out of system

## 2018-06-05 NOTE — ED Notes (Addendum)
[facility] informed they need [facility] to transport pt to [facility] phone call to [place] at this time.

## 2018-06-05 NOTE — H&P (Signed)
History and Physical    Jesse Cordova FGH:[id number] DOB: Dec 12, 1940 DOA: 06/05/2018  PCP: [name], MD   Patient coming from: Home.  I have personally briefly reviewed patient's old medical records in [place]  Chief Complaint: Fall.  HPI: Jesse Cordova is a 78 y.o. male with medical history significant of adrenal nodule, osteoarthritis, skin cancer, chronic low back pain, type 2 diabetes, GERD, hyperlipidemia, hypertension, history of aortic stenosis, s/p TAVR who is coming to the emergency department with severe right hip pain after having an accidental fall at home.  He states he did not hit his head or lose consciousness.  He attributes this to having his knee buckled up and leading to the fall.  He denies headaches, sore throat, hemoptysis, wheezing, dyspnea, chest pain, dizziness, nausea/emesis/diaphoresis, PND, orthopnea, but occasionally gets lower extremity edema.  He denies abdominal pain, diarrhea, constipation, melena, hematochezia, dysuria, frequency or hematuria.  No polyuria, polydipsia, polyphagia or blurred vision.  ED Course: Initial vital signs temperature 97.6 F, pulse 92, respirations 13, blood pressure 155/84 mmHg O2 sat 97% on room air.  The patient was given morphine 4 mg IVP in the emergency department.  While in the ER, the patient had an episode of chest pain.  However, he denies any chest pain at this time.  His CBC was normal with a white count of 10.5, hemoglobin 14.0 g/dL and platelets 187.  Sodium 133, potassium 3.6, chloride 107 and CO2 18 mmol/L.  BUN 29, creatinine 1.16, calcium 8.6 and glucose 150 mg/dL.  Troponin was normal.  EKG was normal sinus rhythm with electrocardiographic evidence of LVH.  Imaging: Femur and hip radiograph shows mildly displaced transcervical femoral neck fracture.  Distal femur is intact.  His chest radiograph did not have any acute finding.  Right knee x-ray showed tricompartmental osteoarthritis, but no acute  findings.  See images and full radiology report for further detail.  Review of Systems: As per HPI otherwise 10 point review of systems negative.   Past Medical History:  Diagnosis Date  . Adrenal nodule ([place]) 11/10/2011  . Arthritis   . Cancer ([place])    skin cancer  . Chronic low back pain   . Complication of anesthesia   . Diabetes mellitus   . GERD (gastroesophageal reflux disease)   . Hyperlipemia   . Hypertension   . 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 PONV (postoperative nausea and vomiting)   . 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 BIOPSY  01/16/2012   Procedure: BIOPSY;  Surgeon: Rogene Houston, MD;  Location: [facility];  Service: Endoscopy;  Laterality: N/A;  . CATARACT EXTRACTION Right 7/06  . CATARACT EXTRACTION W/PHACO Left 01/05/2014   Procedure: CATARACT EXTRACTION PHACO AND INTRAOCULAR LENS PLACEMENT (IOC);  Surgeon: Tonny Branch, MD;  Location: [facility];  Service: Ophthalmology;  Laterality: Left;  CDE:15.98  . COLONOSCOPY  01/29/12   abnormal repeat in one year  . ESOPHAGOGASTRODUODENOSCOPY    . KNEE ARTHROSCOPY     2 right knee surgeries and 1 left knee surgery  . RIGHT/LEFT HEART CATH AND CORONARY ANGIOGRAPHY N/A 11/25/2017   Procedure: RIGHT/LEFT HEART CATH AND CORONARY ANGIOGRAPHY;  Surgeon: Burnell Blanks, MD;  Location:  [facility];  Service: Cardiovascular;  Laterality: N/A;  . TEE WITHOUT CARDIOVERSION N/A 12/22/2017   Procedure: TRANSESOPHAGEAL ECHOCARDIOGRAM (TEE);  Surgeon: Burnell Blanks, MD;  Location: [place];  Service: Open Heart Surgery;  Laterality: N/A;  . TONSILLECTOMY    . TRANSCATHETER AORTIC VALVE REPLACEMENT, TRANSFEMORAL N/A 12/22/2017    Procedure: TRANSCATHETER AORTIC VALVE REPLACEMENT, TRANSFEMORAL;  Surgeon: Burnell Blanks, MD;  Location: [facility];  Service: Open Heart Surgery;  Laterality: N/A;  . VASECTOMY       reports that he has never smoked. He has never used smokeless tobacco. He reports that he does not drink alcohol or use drugs.  Allergies  Allergen Reactions  . Cortisone Other (See Comments)    swelling  . Tetanus Toxoids Other (See Comments)    Swelling, not specified  . Hydrocodone-Homatropine Nausea And Vomiting    Family History  Problem Relation Age of Onset  . Diabetes Sister   . Heart disease Sister   . Atrial fibrillation Sister   . Heart disease Other   . Diabetes Other   . Depression Maternal Grandfather    Prior to Admission medications   Medication Sig Start Date End Date Taking? Authorizing Provider  ACCU-CHEK AVIVA PLUS test strip USE TO TEST SUGAR TWICE DAILY 09/03/16  Yes [name], MD  ACCU-CHEK AVIVA PLUS test strip USE TO TEST SUGAR TWICE DAILY 12/10/16  Yes [name], MD  aspirin EC 81 MG tablet Take 81 mg by mouth daily.   Yes [provider]  benzonatate (TESSALON) 200 MG capsule One three times daily as needed Patient taking differently: Take 200 mg by mouth 3 (three) times daily as needed for cough. One three times daily as needed 01/22/17  Yes Luking, Elayne Snare, MD  benzonatate (TESSALON) 200 MG capsule Take 1 capsule (200 mg total) by mouth 3 (three) times daily as needed for cough. 04/30/18  Yes Mikey Kirschner, MD  blood glucose meter kit and supplies KIT Dispense based on patient and insurance preference. Use to test glucose twice daily. (For ICD 10 code E11.9) 08/19/17  Yes Luking, Elayne Snare, MD  cetirizine (ZYRTEC) 10 MG tablet Take 10 mg by mouth daily.   Yes [provider]  clopidogrel (PLAVIX) 75 MG tablet Take 1 tablet (75 mg total) by mouth daily with breakfast. 12/24/17  Yes Eileen Stanford, PA-C  diphenoxylate-atropine (LOMOTIL)  2.5-0.025 MG tablet TAKE 1 TABLET BY MOUTH FOUR TIMES DAILY AS NEEDED FOR DIARRHEA OR LOOSE STOOLS 06/12/16  Yes Luking, Scott A, MD  empagliflozin (JARDIANCE) 25 MG TABS tablet Take 25 mg by mouth daily. 09/09/17  Yes Luking, Scott A, MD  glipiZIDE (GLUCOTROL) 5 MG tablet TAKE 1/2 TABLET BY MOUTH TWICE DAILY BEFORE A MEAL. 03/19/18  Yes Luking, Scott A, MD  glucose blood (ACCU-CHEK AVIVA PLUS) test strip TEST 2 TIMES A DAY AS DIRECTED. 03/16/18  Yes Luking, Elayne Snare, MD  Insulin Glargine (LANTUS SOLOSTAR) 100 UNIT/ML Solostar Pen INJECT UP TO 30 UNITS INTO SKIN EVERY NIGHT AT BEDTIME AS DIRECTED BY DOCTOR Patient taking differently: Inject 20 Units into the skin at bedtime. INJECT UP TO 30 UNITS INTO SKIN EVERY NIGHT AT BEDTIME AS DIRECTED BY DOCTOR 09/09/17  Yes Luking, Elayne Snare, MD  Insulin Pen Needle (B-D ULTRAFINE III SHORT PEN) 31G X 8 MM MISC Use once daily 02/04/17  Yes Luking, Scott A, MD  lovastatin (MEVACOR) 20 MG tablet TAKE 1 TABLET(20 MG) BY MOUTH AT BEDTIME  09/09/17  Yes [name], MD  metoprolol succinate (TOPROL-XL) 25 MG 24 hr tablet Take 1 tablet by mouth daily. 01/08/18  Yes [provider]  ondansetron (ZOFRAN) 4 MG tablet Take 4 mg by mouth every 8 (eight) hours as needed for nausea or vomiting.   Yes [provider]  oxyCODONE-acetaminophen (PERCOCET/ROXICET) 5-325 MG tablet Take 1 tablet by mouth every 4 (four) hours as needed for up to 5 days. 06/04/18 06/09/18 Yes Luking, Elayne Snare, MD  pantoprazole (PROTONIX) 40 MG tablet Take 1 tablet (40 mg total) by mouth daily. 12/24/17  Yes Eileen Stanford, PA-C  predniSONE (DELTASONE) 10 MG tablet Take 2 tablets for 3 days then take one tablet for 3 days 03/10/18  Yes Luking, Elayne Snare, MD  silver sulfADIAZINE (SILVADENE) 1 % cream Apply 1 application topically daily as needed (skin irritation).   Yes [provider]  triamcinolone cream (KENALOG) 0.1 % Apply 1 application topically 2 (two) times daily as needed.  02/18/16  Yes Luking, Elayne Snare, MD  albuterol (PROVENTIL) (2.5 MG/3ML) 0.083% nebulizer solution Take 3 mLs (2.5 mg total) by nebulization every 4 (four) hours as needed for wheezing. Patient not taking: Reported on 06/01/2018 05/04/18   [name], MD    Physical Exam: Vitals:   06/05/18 0300 06/05/18 0330 06/05/18 0400 06/05/18 0430  BP: (!) 158/81 (!) 150/85 (!) 148/70 139/84  Pulse: 88 88 90 93  Resp: _0 Temp:      TempSrc:      SpO2: 96% 96% 97% 95%  Weight:      Height:        Constitutional: NAD, calm, comfortable Eyes: PERRL, lids and conjunctivae normal ENMT: Mucous membranes are moist. Posterior pharynx clear of any exudate or lesions. Neck: normal, supple, no masses, no thyromegaly Respiratory: clear to auscultation bilaterally, no wheezing, no crackles. Normal respiratory effort. No accessory muscle use.  Cardiovascular: Regular rate and rhythm, no murmurs / rubs / gallops. No extremity edema. 2+ pedal pulses. No carotid bruits.  Abdomen: Soft, no tenderness, no masses palpated. No hepatosplenomegaly. Bowel sounds positive.  Musculoskeletal: no clubbing / cyanosis.  Positive right hip tenderness.  Severely decreased hip and RLE in general ROM, no contractures. Normal muscle tone.  Skin: Bilateral elbow abrasions.  His face is mildly erythematous.  Right axillary area lesion. Neurologic: CN 2-12 grossly intact. Sensation intact, DTR normal. Strength 5/5 in all 4.  Psychiatric: Normal judgment and insight. Alert and oriented x 3. Normal mood.   Labs on Admission: I have personally reviewed following labs and imaging studies  CBC: Recent Labs  Lab 06/05/18 0203  WBC 10.5  NEUTROABS 8.1*  HGB 14.0  HCT 42.9  MCV 89.9  PLT 956   Basic Metabolic Panel: Recent Labs  Lab 06/05/18 0203  NA 133*  K 3.6  CL 107  CO2 18*  GLUCOSE 150*  BUN 29*  CREATININE 1.16  CALCIUM 8.6*   GFR: Estimated Creatinine Clearance: 54.2 mL/min (by C-G formula based on  SCr of 1.16 mg/dL). Liver Function Tests: No results for input(s): AST, ALT, ALKPHOS, BILITOT, PROT, ALBUMIN in the last 168 hours. No results for input(s): LIPASE, AMYLASE in the last 168 hours. No results for input(s): AMMONIA in the last 168 hours. Coagulation Profile: No results for input(s): INR, PROTIME in the last 168 hours. Cardiac Enzymes: Recent Labs  Lab 06/05/18 0203  TROPONINI <0.03   BNP (last 3 results) No results for input(s): PROBNP  in the last 8760 hours. HbA1C: No results for input(s): HGBA1C in the last 72 hours. CBG: No results for input(s): GLUCAP in the last 168 hours. Lipid Profile: No results for input(s): CHOL, HDL, LDLCALC, TRIG, CHOLHDL, LDLDIRECT in the last 72 hours. Thyroid Function Tests: No results for input(s): TSH, T4TOTAL, FREET4, T3FREE, THYROIDAB in the last 72 hours. Anemia Panel: No results for input(s): VITAMINB12, FOLATE, FERRITIN, TIBC, IRON, RETICCTPCT in the last 72 hours. Urine analysis:    Component Value Date/Time   COLORURINE YELLOW 12/18/2017 1311   APPEARANCEUR CLEAR 12/18/2017 1311   LABSPEC 1.025 12/18/2017 1311   PHURINE 5.0 12/18/2017 1311   GLUCOSEU >=500 (A) 12/18/2017 1311   HGBUR NEGATIVE 12/18/2017 1311   BILIRUBINUR NEGATIVE 12/18/2017 1311   KETONESUR NEGATIVE 12/18/2017 1311   PROTEINUR NEGATIVE 12/18/2017 1311   UROBILINOGEN 0.2 11/10/2011 0040   NITRITE NEGATIVE 12/18/2017 1311   LEUKOCYTESUR NEGATIVE 12/18/2017 1311    Radiological Exams on Admission: Dg Knee 2 Views Right  Result Date: 06/05/2018 CLINICAL DATA:  Slip and fall landing on hardwood floor today, right hip pain radiating to the knee. EXAM: RIGHT KNEE - 1-2 VIEW COMPARISON:  Radiograph 06/01/2018 FINDINGS: No acute fracture or dislocation. Tricompartmental osteoarthritis with peripheral spurring. Marked medial tibiofemoral joint space narrowing. Chondrocalcinosis most prominent in the lateral tibiofemoral compartment. Intra-articular ossified  bodies posteriorly in the knee joint. Trace joint effusion. IMPRESSION: Tricompartmental osteoarthritis without acute fracture or dislocation. Electronically Signed   By: Keith Rake M.D.   On: 06/05/2018 03:00   Dg Chest Portable 1 View  Result Date: 06/05/2018 CLINICAL DATA:  Chest pain, fall with right hip fracture. EXAM: PORTABLE CHEST 1 VIEW COMPARISON:  05/04/2018 FINDINGS: The cardiomediastinal contours are normal. Post TAVR. Pulmonary vasculature is normal. No consolidation, pleural effusion, or pneumothorax. No acute osseous abnormalities are seen. IMPRESSION: No acute chest findings. Electronically Signed   By: Keith Rake M.D.   On: 06/05/2018 03:42   Dg Hip Unilat W Or Wo Pelvis 2-3 Views Right  Result Date: 06/05/2018 CLINICAL DATA:  Slip and fall landing on hardwood floor today, right hip pain radiating to the knee. EXAM: DG HIP (WITH OR WITHOUT PELVIS) 2-3V RIGHT COMPARISON:  None. FINDINGS: Mildly displaced transcervical fracture of the right femoral neck. Femoral head remains seated. No significant angulation. No additional fracture. Pubic rami are intact. Pubic symphysis and sacroiliac joints are congruent. IMPRESSION: Mildly displaced transcervical right femoral neck fracture. Electronically Signed   By: Keith Rake M.D.   On: 06/05/2018 02:58   Dg Femur Min 2 Views Right  Result Date: 06/05/2018 CLINICAL DATA:  Slip and fall landing on hardwood floor today, right hip pain radiating to the knee. EXAM: RIGHT FEMUR 2 VIEWS COMPARISON:  None. FINDINGS: Mildly displaced transcervical femoral neck fracture. The distal femur is intact. No additional fracture. Degenerative change of the knee, better characterized on concurrent knee radiographs. IMPRESSION: Mildly displaced transcervical femoral neck fracture. Distal femur is intact. Electronically Signed   By: Keith Rake M.D.   On: 06/05/2018 02:59   11/25/2017 RIGHT/LEFT HEART CATH AND CORONARY ANGIOGRAPHY  Conclusion      Prox RCA lesion is 20% stenosed.  Prox LAD to Mid LAD lesion is 10% stenosed.   1. Mild non-obstructive CAD 2. Severe aortic stenosis (mean gradient 31.7 mmHg, peak to peak gradient 32 mmHg, AVA 0.72 cm2)  Recommendations: Will begin workup for TAVR. We will arrange CT scans and other testing and then make a referral to see Dr.  Bartle or Dr. Roxy Manns. The patient and his family are aware that they will be contacted for follow up by the valve team. I have reviewed the TAVR protocol and procedure with the patient and family today.    EKG: Independently reviewed.  Vent. rate 84 BPM PR interval * ms QRS duration 91 ms QT/QTc 380/450 ms P-R-T axes 50 -30 3 Sinus rhythm Left ventricular hypertrophy  Assessment/Plan Principal Problem:   Fracture of femoral neck, right, closed (HCC) Admit to telemetry/inpatient. Keep n.p.o. Analgesics as needed. Antiemetics as needed. Consult case management. Consult dietitian. Consult social services. Orthopedic surgery will evaluate.  Active Problems:   Chest pain Telemetry monitoring. Trend troponin level. Continue beta-blocker.    CAD (coronary artery disease) (nonobstructive)   S/P TAVR (transcatheter aortic valve replacement) No signs of decompensation. Continue beta-blocker. Plavix and aspirin held in anticipation of surgery. Resume statin after surgical procedure.    DM type 2 (diabetes mellitus, type 2) (HCC) Currently n.p.o. Hold Lantus and Jardiance.    Essential hypertension, benign Continue metoprolol XL 25 mg p.o. daily. Monitor BP and HR.    Hyperlipidemia Resume statin after surgery.    GERD (gastroesophageal reflux disease) Protonix 40 mg p.o. daily.    DVT prophylaxis: SCDs. Code Status: Full code. Family Communication: His daughters and granddaughter were present in the ED room. Disposition Plan: Admit to [facility] for Consults called:  Admission status: Inpatient/telemetry.   Reubin Milan MD [facility]  If 7PM-7AM, please contact night-coverage [email or web address] Password [email or web address]  06/05/2018, 5:19 AM

## 2018-06-05 NOTE — ED Provider Notes (Signed)
Spoke with Dr. Stann Mainland.  Concern for pathologic fracture on CT.  Will need orthopedic oncologist.  Advises transfer to [facility] or another tertiary care center.  Patient has history of Merkel cell carcinoma and is being treated at [facility].  Will contact orthopedics at [facility].  Spoke with Dr. Constance Goltz.  Agrees with transfer.  Dr. Junious Silk in the emergency department [facility] will accept patient.  Patient remains hemodynamically stable.   Julianne Rice, MD 06/05/18 1306

## 2018-06-11 ENCOUNTER — Inpatient Hospital Stay
Admission: RE | Admit: 2018-06-11 | Discharge: 2018-06-28 | Disposition: A | Discharge status: Home / Self Care | Payer: Medicare Other | Source: Ambulatory Visit | Attending: Internal Medicine | Admitting: Internal Medicine

## 2018-06-11 MED ORDER — POLYETHYLENE GLYCOL 3350 17 G PO PACK
17.00 | PACK | ORAL | Status: DC
Start: 2018-06-12 — End: 2018-06-11

## 2018-06-11 MED ORDER — ONDANSETRON HCL 8 MG PO TABS
4.00 | ORAL_TABLET | ORAL | Status: DC
Start: ? — End: 2018-06-11

## 2018-06-11 MED ORDER — SENNOSIDES 8.6 MG PO TABS
2.00 | ORAL_TABLET | ORAL | Status: DC
Start: 2018-06-11 — End: 2018-06-11

## 2018-06-11 MED ORDER — GENERIC EXTERNAL MEDICATION
Status: DC
Start: ? — End: 2018-06-11

## 2018-06-11 MED ORDER — INSULIN LISPRO 100 UNIT/ML ~~LOC~~ SOLN
0.00 | SUBCUTANEOUS | Status: DC
Start: 2018-06-11 — End: 2018-06-11

## 2018-06-11 MED ORDER — GENERIC EXTERNAL MEDICATION
5.00 | Status: DC
Start: ? — End: 2018-06-11

## 2018-06-11 MED ORDER — ACETAMINOPHEN 500 MG PO TABS
1000.00 | ORAL_TABLET | ORAL | Status: DC
Start: 2018-06-11 — End: 2018-06-11

## 2018-06-11 MED ORDER — VITAMIN D (ERGOCALCIFEROL) 1.25 MG (50000 UNIT) PO CAPS
50000.00 | ORAL_CAPSULE | ORAL | Status: DC
Start: 2018-06-16 — End: 2018-06-11

## 2018-06-11 MED ORDER — INSULIN GLARGINE 100 UNIT/ML ~~LOC~~ SOLN
10.00 | SUBCUTANEOUS | Status: DC
Start: 2018-06-11 — End: 2018-06-11

## 2018-06-11 MED ORDER — METOPROLOL SUCCINATE ER 25 MG PO TB24
25.00 | ORAL_TABLET | ORAL | Status: DC
Start: 2018-06-11 — End: 2018-06-11

## 2018-06-11 MED ORDER — ASPIRIN 81 MG PO CHEW
81.00 | CHEWABLE_TABLET | ORAL | Status: DC
Start: 2018-06-12 — End: 2018-06-11

## 2018-06-11 MED ORDER — ENOXAPARIN SODIUM 40 MG/0.4ML ~~LOC~~ SOLN
40.00 | SUBCUTANEOUS | Status: DC
Start: 2018-06-12 — End: 2018-06-11

## 2018-06-11 MED ORDER — PANTOPRAZOLE SODIUM 20 MG PO TBEC
20.00 | DELAYED_RELEASE_TABLET | ORAL | Status: DC
Start: 2018-06-12 — End: 2018-06-11

## 2018-06-11 MED ORDER — DEXTROSE 10 % IV SOLN
12.50 | INTRAVENOUS | Status: DC
Start: ? — End: 2018-06-11

## 2018-06-14 ENCOUNTER — Encounter: Payer: Self-pay | Admitting: Internal Medicine

## 2018-06-14 ENCOUNTER — Other Ambulatory Visit (HOSPITAL_COMMUNITY)
Admission: RE | Admit: 2018-06-14 | Discharge: 2018-06-14 | Disposition: A | Discharge status: Home / Self Care | Payer: Medicare Other | Source: Skilled Nursing Facility | Attending: Internal Medicine | Admitting: Internal Medicine

## 2018-06-14 DIAGNOSIS — I10 Essential (primary) hypertension: Secondary | ICD-10-CM | POA: Insufficient documentation

## 2018-06-14 DIAGNOSIS — C4A30 Merkel cell carcinoma of unspecified part of face: Secondary | ICD-10-CM | POA: Insufficient documentation

## 2018-06-14 DIAGNOSIS — E119 Type 2 diabetes mellitus without complications: Secondary | ICD-10-CM | POA: Insufficient documentation

## 2018-06-14 DIAGNOSIS — E785 Hyperlipidemia, unspecified: Secondary | ICD-10-CM | POA: Insufficient documentation

## 2018-06-14 LAB — CBC WITH DIFFERENTIAL/PLATELET
Abs Immature Granulocytes: 0.15 10*3/uL — ABNORMAL HIGH (ref 0.00–0.07)
BASOS ABS: 0.1 10*3/uL (ref 0.0–0.1)
Basophils Relative: 1 %
Eosinophils Absolute: 0.1 10*3/uL (ref 0.0–0.5)
Eosinophils Relative: 2 %
HEMATOCRIT: 37 % — AB (ref 39.0–52.0)
Hemoglobin: 11.4 g/dL — ABNORMAL LOW (ref 13.0–17.0)
Immature Granulocytes: 2 %
Lymphocytes Relative: 18 %
Lymphs Abs: 1.7 10*3/uL (ref 0.7–4.0)
MCH: 28.9 pg (ref 26.0–34.0)
MCHC: 30.8 g/dL (ref 30.0–36.0)
MCV: 93.7 fL (ref 80.0–100.0)
Monocytes Absolute: 1 10*3/uL (ref 0.1–1.0)
Monocytes Relative: 11 %
Neutro Abs: 6.3 10*3/uL (ref 1.7–7.7)
Neutrophils Relative %: 66 %
Platelets: 395 10*3/uL (ref 150–400)
RBC: 3.95 MIL/uL — ABNORMAL LOW (ref 4.22–5.81)
RDW: 13.7 % (ref 11.5–15.5)
WBC: 9.4 10*3/uL (ref 4.0–10.5)
nRBC: 0 % (ref 0.0–0.2)

## 2018-06-14 LAB — BASIC METABOLIC PANEL
Anion gap: 10 (ref 5–15)
BUN: 20 mg/dL (ref 8–23)
CALCIUM: 8.7 mg/dL — AB (ref 8.9–10.3)
CO2: 24 mmol/L (ref 22–32)
Chloride: 102 mmol/L (ref 98–111)
Creatinine, Ser: 0.89 mg/dL (ref 0.61–1.24)
GFR calc Af Amer: >60 mL/min (ref >60)
GFR calc non Af Amer: >60 mL/min (ref >60)
Glucose, Bld: 133 mg/dL — ABNORMAL HIGH (ref 70–99)
Potassium: 3.8 mmol/L (ref 3.5–5.1)
Sodium: 136 mmol/L (ref 135–145)

## 2018-06-14 NOTE — Progress Notes (Signed)
Provider:Anjali Lyndel Safe MD   Location:    [place] Room Number: 155/P Place of Service:  SNF ([phone])  PCP: [name], MD Patient Care Team: [name], MD as PCP - General [facility] Medicine)  Extended Emergency Contact Information Primary Emergency Contact: Perrin Smack, [place] [address] Johnnette Litter of [place] Phone: [phone] Mobile Phone: [phone] Relation: Granddaughter Secondary Emergency Contact: Lupita Raider, [place] [address] Johnnette Litter of [place] Phone: [phone] Work Phone: [phone] Mobile Phone: [phone] Relation: Daughter  Code Status: Full Code Goals of Care: Advanced Directive information Advanced Directives 06/14/2018  Does Patient Have a Medical Advance Directive? Yes  Type of Advance Directive (No Data)  Does patient want to make changes to medical advance directive? No - Patient declined  Copy of [place] in Chart? -  Pre-existing out of facility DNR order (yellow form or pink MOST form) -      Chief Complaint  Patient presents with  . New Admit To SNF    New Admission Visit    HPI: Patient is a 78 y.o. male seen today for admission to  Past Medical History:  Diagnosis Date  . Adrenal nodule ([place]) 11/10/2011  . Arthritis   . Cancer ([place])    skin cancer  . Chronic low back pain   . Complication of anesthesia   . Diabetes mellitus   . GERD (gastroesophageal reflux disease)   . Hyperlipemia   . Hypertension   . 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 PONV (postoperative nausea and vomiting)   . 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 BIOPSY  01/16/2012   Procedure:  BIOPSY;  Surgeon: Rogene Houston, MD;  [facility];  Service: Endoscopy;  Laterality: N/A;  . CATARACT EXTRACTION Right 7/06  . CATARACT EXTRACTION W/PHACO Left 01/05/2014   Procedure: CATARACT EXTRACTION PHACO AND INTRAOCULAR LENS PLACEMENT (IOC);  Surgeon: Tonny Branch, MD;  Location: [facility] ORS;  Service: Ophthalmology;  Laterality: Left;  CDE:15.98  . COLONOSCOPY  01/29/12   abnormal repeat in one year  . ESOPHAGOGASTRODUODENOSCOPY    . KNEE ARTHROSCOPY     2 right knee surgeries and 1 left knee surgery  . RIGHT/LEFT HEART CATH AND CORONARY ANGIOGRAPHY N/A 11/25/2017   Procedure: RIGHT/LEFT HEART CATH AND CORONARY ANGIOGRAPHY;  Surgeon: Burnell Blanks, MD;  Location: [place] CV LAB;  Service: Cardiovascular;  Laterality: N/A;  . TEE WITHOUT CARDIOVERSION N/A 12/22/2017   Procedure: TRANSESOPHAGEAL ECHOCARDIOGRAM (TEE);  Surgeon: Burnell Blanks, MD;  Location: [place];  Service: Open Heart Surgery;  Laterality: N/A;  . TONSILLECTOMY    . TRANSCATHETER AORTIC VALVE REPLACEMENT, TRANSFEMORAL N/A 12/22/2017   Procedure: TRANSCATHETER AORTIC VALVE REPLACEMENT, TRANSFEMORAL;  Surgeon: Burnell Blanks, MD;  Location: [place];  Service: Open Heart Surgery;  Laterality: N/A;  . VASECTOMY      reports that he has never smoked. He has never used smokeless tobacco. He reports that he does not drink alcohol or use drugs. Social History   Socioeconomic History  . Marital status: Married    Spouse name: Not on file  .  Number of children: Not on file  . Years of education: Not on file  . Highest education level: Not on file  Occupational History  . Not on file  Social Needs  . Financial resource strain: Not on file  . Food insecurity:    Worry: Not on file    Inability: Not on file  . Transportation needs:    Medical: Not on file    Non-medical: Not on file  Tobacco Use  . Smoking status: Never Smoker  . Smokeless tobacco: Never Used  . Tobacco comment: From  spouse. Wife does not smoke around him now  Substance and Sexual Activity  . Alcohol use: No    Alcohol/week: 0.0 standard drinks  . Drug use: No  . Sexual activity: Not on file  Lifestyle  . Physical activity:    Days per week: Not on file    Minutes per session: Not on file  . Stress: Not on file  Relationships  . Social connections:    Talks on phone: Not on file    Gets together: Not on file    Attends religious service: Not on file    Active member of club or organization: Not on file    Attends meetings of clubs or organizations: Not on file    Relationship status: Not on file  . Intimate partner violence:    Fear of current or ex partner: Not on file    Emotionally abused: Not on file    Physically abused: Not on file    Forced sexual activity: Not on file  Other Topics Concern  . Not on file  Social History Narrative  . Not on file    Functional Status Survey:    Family History  Problem Relation Age of Onset  . Diabetes Sister   . Heart disease Sister   . Atrial fibrillation Sister   . Heart disease Other   . Diabetes Other   . Depression Maternal Grandfather     Health Maintenance  Topic Date Due  . FOOT EXAM  01/22/2018  . OPHTHALMOLOGY EXAM  07/15/2018 (Originally 02/16/2015)  . TETANUS/TDAP  07/15/2018 (Originally 03/26/2008)  . URINE MICROALBUMIN  09/01/2018  . HEMOGLOBIN A1C  09/07/2018  . INFLUENZA VACCINE  Completed  . PNA vac Low Risk Adult  Completed    Allergies  Allergen Reactions  . Cortisone Other (See Comments)    swelling  . Tetanus Toxoids Other (See Comments)    Swelling, not specified  . Hydrocodone-Homatropine Nausea And Vomiting    Outpatient Encounter Medications as of 06/14/2018  Medication Sig  . acetaminophen (TYLENOL) 500 MG tablet Take 1,000 mg by mouth 3 (three) times daily.  Marland Kitchen aspirin EC 81 MG tablet Take 81 mg by mouth daily.  . benzonatate (TESSALON) 200 MG capsule Take 200 mg by mouth 3 (three) times daily as  needed for cough.  . clopidogrel (PLAVIX) 75 MG tablet Take 75 mg by mouth every evening.  . diphenoxylate-atropine (LOMOTIL) 2.5-0.025 MG tablet Take 1 tablet by mouth 4 (four) times daily as needed for diarrhea or loose stools.  . empagliflozin (JARDIANCE) 25 MG TABS tablet Take 25 mg by mouth daily.  Marland Kitchen enoxaparin (LOVENOX) 40 MG/0.4ML injection Inject 40 mg into the skin daily.  . ergocalciferol (VITAMIN D2) 1.25 MG (50000 UT) capsule Take 50,000 Units by mouth once a week.  Marland Kitchen glipiZIDE (GLUCOTROL) 5 MG tablet Take 2.5 mg by mouth daily before breakfast.  . ibuprofen (ADVIL,MOTRIN) 200 MG tablet  Take 200 mg by mouth daily as needed.  . insulin glargine (LANTUS) 100 UNIT/ML injection Inject 10 Units into the skin at bedtime.  . metoprolol succinate (TOPROL-XL) 25 MG 24 hr tablet Take 1 tablet (25 mg total) by mouth daily.  Marland Kitchen omeprazole (PRILOSEC) 20 MG capsule Take 20 mg by mouth daily.  . ondansetron (ZOFRAN) 4 MG tablet Take 4 mg by mouth every 8 (eight) hours as needed for nausea or vomiting.  Marland Kitchen oxyCODONE (OXY IR/ROXICODONE) 5 MG immediate release tablet Give 1 tablet by mouth every 4 hours prn for pain level of 1-5. Give 2 tablets by mouth every 4 hours prn for pain level of 6-10  . polyethylene glycol (MIRALAX / GLYCOLAX) packet Take 17 g by mouth 2 (two) times daily.  Marland Kitchen senna (SENOKOT) 8.6 MG tablet Take 1 tablet by mouth at bedtime as needed for constipation.  . [DISCONTINUED] candesartan-hydrochlorothiazide (ATACAND HCT) 32-12.5 MG tablet Take 1 tablet by mouth daily.  . [DISCONTINUED] HYDROcodone-acetaminophen (NORCO) 7.5-325 MG tablet Take 1 tablet by mouth every 6 (six) hours as needed for moderate pain.  . [DISCONTINUED] metFORMIN (GLUCOPHAGE-XR) 500 MG 24 hr tablet Take 500 mg by mouth daily with breakfast.  . [DISCONTINUED] potassium chloride SA (K-DUR,KLOR-CON) 20 MEQ tablet Take 40 mEq by mouth daily.  . [DISCONTINUED] pravastatin (PRAVACHOL) 20 MG tablet Take 20 mg by mouth  daily.  . [DISCONTINUED] predniSONE (DELTASONE) 20 MG tablet Take 20 mg by mouth daily with breakfast.  . [DISCONTINUED] vitamin B-12 (CYANOCOBALAMIN) 250 MCG tablet Take 250 mcg by mouth daily.   No facility-administered encounter medications on file as of 06/14/2018.      Review of Systems  Vitals:   06/14/18 1057  BP: 113/68  Pulse: 91  Resp: 20  Temp: 98.4 F (36.9 C)  TempSrc: Oral  SpO2: 94%   There is no height or weight on file to calculate BMI. Physical Exam  Labs reviewed: Basic Metabolic Panel: Recent Labs    12/23/17 0512  06/05/18 0203 06/05/18 0526 06/14/18 0843  NA 136   < > 133* 136 136  K 4.0   < > 3.6 3.6 3.8  CL 103   < > 107 108 102  CO2 22   < > 18* 18* 24  GLUCOSE 137*   < > 150* 119* 133*  BUN 16   < > 29* 25* 20  CREATININE 1.18   < > 1.16 1.03 0.89  CALCIUM 9.2   < > 8.6* 8.9 8.7*  MG 2.0  --   --   --   --    < > = values in this interval not displayed.   Liver Function Tests: Recent Labs    08/31/17 0806 12/18/17 1254  AST 28 23  ALT 14 20  ALKPHOS 64 56  BILITOT 0.5 1.0  PROT 7.2 7.0  ALBUMIN 4.6 4.0   No results for input(s): LIPASE, AMYLASE in the last 8760 hours. No results for input(s): AMMONIA in the last 8760 hours. CBC: Recent Labs    06/05/18 0203 06/05/18 0526 06/14/18 0854  WBC 10.5 8.6 9.4  NEUTROABS 8.1* 5.9 6.3  HGB 14.0 13.7 11.4*  HCT 42.9 43.1 37.0*  MCV 89.9 90.4 93.7  PLT 187 188 395   Cardiac Enzymes: Recent Labs    06/05/18 0203 06/05/18 0738 06/05/18 1356  TROPONINI <0.03 <0.03 <0.03   BNP: Invalid input(s): POCBNP Lab Results  Component Value Date   HGBA1C 6.6 (A) 03/08/2018   Lab Results  Component Value Date   TSH 2.530 12/30/2017   No results found for: VITAMINB12 No results found for: FOLATE No results found for: IRON, TIBC, FERRITIN  Imaging and Procedures obtained prior to SNF admission: No results found.  Assessment/Plan There are no diagnoses linked to this  encounter.   Family/ staff Communication:   Labs/tests ordered:

## 2018-06-14 NOTE — Progress Notes (Signed)
Provider: Veleta Miners MD  Location:    [place] Room Number: 155/P Place of Service:  SNF (31)  PCP: [name], MD Patient Care Team: [name], MD as PCP - General [facility] Medicine)  Extended Emergency Contact Information Primary Emergency Contact: Perrin Smack, [place] [address] Johnnette Litter of [place] Phone: [phone] Mobile Phone: [phone] Relation: Granddaughter Secondary Emergency Contact: Lupita Raider, [place] [address] Johnnette Litter of [place] Phone: [phone] Work Phone: [phone] Mobile Phone: [phone] Relation: Daughter  Code Status: Full Code Goals of Care: Advanced Directive information Advanced Directives 06/14/2018  Does Patient Have a Medical Advance Directive? Yes  Type of Advance Directive (No Data)  Does patient want to make changes to medical advance directive? No - Patient declined  Copy of [place] in Chart? -  Pre-existing out of facility DNR order (yellow form or pink MOST form) -      Chief Complaint  Patient presents with  . New Admit To SNF    New Admission Visit    HPI: Patient is a 78 y.o. male seen today for admission to  Past Medical History:  Diagnosis Date  . Adrenal nodule ([place]) 11/10/2011  . Arthritis   . Cancer ([place])    skin cancer  . Chronic low back pain   . Complication of anesthesia   . Diabetes mellitus   . GERD (gastroesophageal reflux disease)   . Hyperlipemia   . Hypertension   . 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 PONV (postoperative nausea and vomiting)   . 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 BIOPSY  01/16/2012   Procedure:  BIOPSY;  Surgeon: Rogene Houston, MD;  [facility];  Service: Endoscopy;  Laterality: N/A;  . CATARACT EXTRACTION Right 7/06  . CATARACT EXTRACTION W/PHACO Left 01/05/2014   Procedure: CATARACT EXTRACTION PHACO AND INTRAOCULAR LENS PLACEMENT (IOC);  Surgeon: Tonny Branch, MD;  Location: [facility];  Service: Ophthalmology;  Laterality: Left;  CDE:15.98  . COLONOSCOPY  01/29/12   abnormal repeat in one year  . ESOPHAGOGASTRODUODENOSCOPY    . KNEE ARTHROSCOPY     2 right knee surgeries and 1 left knee surgery  . RIGHT/LEFT HEART CATH AND CORONARY ANGIOGRAPHY N/A 11/25/2017   Procedure: RIGHT/LEFT HEART CATH AND CORONARY ANGIOGRAPHY;  Surgeon: Burnell Blanks, MD;  Location: [place] CV LAB;  Service: Cardiovascular;  Laterality: N/A;  . TEE WITHOUT CARDIOVERSION N/A 12/22/2017   Procedure: TRANSESOPHAGEAL ECHOCARDIOGRAM (TEE);  Surgeon: Burnell Blanks, MD;  Location: [place];  Service: Open Heart Surgery;  Laterality: N/A;  . TONSILLECTOMY    . TRANSCATHETER AORTIC VALVE REPLACEMENT, TRANSFEMORAL N/A 12/22/2017   Procedure: TRANSCATHETER AORTIC VALVE REPLACEMENT, TRANSFEMORAL;  Surgeon: Burnell Blanks, MD;  Location: [place];  Service: Open Heart Surgery;  Laterality: N/A;  . VASECTOMY      reports that he has never smoked. He has never used smokeless tobacco. He reports that he does not drink alcohol or use drugs. Social History   Socioeconomic History  . Marital status: Married    Spouse name: Not on file  .  Number of children: Not on file  . Years of education: Not on file  . Highest education level: Not on file  Occupational History  . Not on file  Social Needs  . Financial resource strain: Not on file  . Food insecurity:    Worry: Not on file    Inability: Not on file  . Transportation needs:    Medical: Not on file    Non-medical: Not on file  Tobacco Use  . Smoking status: Never Smoker  . Smokeless tobacco: Never Used  . Tobacco comment: From  spouse. Wife does not smoke around him now  Substance and Sexual Activity  . Alcohol use: No    Alcohol/week: 0.0 standard drinks  . Drug use: No  . Sexual activity: Not on file  Lifestyle  . Physical activity:    Days per week: Not on file    Minutes per session: Not on file  . Stress: Not on file  Relationships  . Social connections:    Talks on phone: Not on file    Gets together: Not on file    Attends religious service: Not on file    Active member of club or organization: Not on file    Attends meetings of clubs or organizations: Not on file    Relationship status: Not on file  . Intimate partner violence:    Fear of current or ex partner: Not on file    Emotionally abused: Not on file    Physically abused: Not on file    Forced sexual activity: Not on file  Other Topics Concern  . Not on file  Social History Narrative  . Not on file    Functional Status Survey:    Family History  Problem Relation Age of Onset  . Diabetes Sister   . Heart disease Sister   . Atrial fibrillation Sister   . Heart disease Other   . Diabetes Other   . Depression Maternal Grandfather     Health Maintenance  Topic Date Due  . FOOT EXAM  01/22/2018  . OPHTHALMOLOGY EXAM  07/15/2018 (Originally 02/16/2015)  . TETANUS/TDAP  07/15/2018 (Originally 03/26/2008)  . URINE MICROALBUMIN  09/01/2018  . HEMOGLOBIN A1C  09/07/2018  . INFLUENZA VACCINE  Completed  . PNA vac Low Risk Adult  Completed    Allergies  Allergen Reactions  . Cortisone Other (See Comments)    swelling  . Tetanus Toxoids Other (See Comments)    Swelling, not specified  . Hydrocodone-Homatropine Nausea And Vomiting    Outpatient Encounter Medications as of 06/14/2018  Medication Sig  . candesartan-hydrochlorothiazide (ATACAND HCT) 32-12.5 MG tablet Take 1 tablet by mouth daily.  Marland Kitchen HYDROcodone-acetaminophen (NORCO) 7.5-325 MG tablet Take 1 tablet by mouth every 6 (six) hours as needed for moderate pain.  .  metFORMIN (GLUCOPHAGE-XR) 500 MG 24 hr tablet Take 500 mg by mouth daily with breakfast.  . metoprolol succinate (TOPROL-XL) 25 MG 24 hr tablet Take 1 tablet (25 mg total) by mouth daily.  . potassium chloride SA (K-DUR,KLOR-CON) 20 MEQ tablet Take 40 mEq by mouth daily.  . pravastatin (PRAVACHOL) 20 MG tablet Take 20 mg by mouth daily.  . predniSONE (DELTASONE) 20 MG tablet Take 20 mg by mouth daily with breakfast.  . vitamin B-12 (CYANOCOBALAMIN) 250 MCG tablet Take 250 mcg by mouth daily.  . [DISCONTINUED] acetaminophen (TYLENOL) 325 MG tablet Take 2 tablets (650 mg total) by mouth every 6 (six) hours as needed for mild pain (or  Fever >/= 101).  . [DISCONTINUED] acetaminophen (TYLENOL) 650 MG suppository Place 1 suppository (650 mg total) rectally every 6 (six) hours as needed for mild pain (or Fever >/= 101).  . [DISCONTINUED] insulin aspart (NOVOLOG) 100 UNIT/ML injection Inject 0-15 Units into the skin every 4 (four) hours.  . [DISCONTINUED] ondansetron (ZOFRAN) 4 MG tablet Take 1 tablet (4 mg total) by mouth every 6 (six) hours as needed for nausea.  . [DISCONTINUED] ondansetron (ZOFRAN) 4 MG/2ML SOLN injection Inject 2 mLs (4 mg total) into the vein every 6 (six) hours as needed for nausea.   No facility-administered encounter medications on file as of 06/14/2018.      Review of Systems  There were no vitals filed for this visit. There is no height or weight on file to calculate BMI. Physical Exam  Labs reviewed: Basic Metabolic Panel: Recent Labs    12/23/17 0512  06/05/18 0203 06/05/18 0526 06/14/18 0843  NA 136   < > 133* 136 136  K 4.0   < > 3.6 3.6 3.8  CL 103   < > 107 108 102  CO2 22   < > 18* 18* 24  GLUCOSE 137*   < > 150* 119* 133*  BUN 16   < > 29* 25* 20  CREATININE 1.18   < > 1.16 1.03 0.89  CALCIUM 9.2   < > 8.6* 8.9 8.7*  MG 2.0  --   --   --   --    < > = values in this interval not displayed.   Liver Function Tests: Recent Labs    08/31/17 0806  12/18/17 1254  AST 28 23  ALT 14 20  ALKPHOS 64 56  BILITOT 0.5 1.0  PROT 7.2 7.0  ALBUMIN 4.6 4.0   No results for input(s): LIPASE, AMYLASE in the last 8760 hours. No results for input(s): AMMONIA in the last 8760 hours. CBC: Recent Labs    06/05/18 0203 06/05/18 0526 06/14/18 0854  WBC 10.5 8.6 9.4  NEUTROABS 8.1* 5.9 6.3  HGB 14.0 13.7 11.4*  HCT 42.9 43.1 37.0*  MCV 89.9 90.4 93.7  PLT 187 188 395   Cardiac Enzymes: Recent Labs    06/05/18 0203 06/05/18 0738 06/05/18 1356  TROPONINI <0.03 <0.03 <0.03   BNP: Invalid input(s): POCBNP Lab Results  Component Value Date   HGBA1C 6.6 (A) 03/08/2018   Lab Results  Component Value Date   TSH 2.530 12/30/2017   No results found for: VITAMINB12 No results found for: FOLATE No results found for: IRON, TIBC, FERRITIN  Imaging and Procedures obtained prior to SNF admission: No results found.  Assessment/Plan There are no diagnoses linked to this encounter.   Family/ staff Communication:   Labs/tests ordered:   This encounter was created in error - please disregard.

## 2018-06-15 ENCOUNTER — Ambulatory Visit: Payer: Medicare Other | Admitting: Orthopaedic Surgery

## 2018-06-15 NOTE — Progress Notes (Signed)
This encounter was created in error - please disregard.

## 2018-06-16 ENCOUNTER — Other Ambulatory Visit: Payer: Self-pay

## 2018-06-16 MED ORDER — OXYCODONE HCL 5 MG PO TABS: ORAL_TABLET | ORAL | 0 refills | Status: DC

## 2018-06-16 NOTE — Telephone Encounter (Signed)
RX Fax for [facility]@ [phone]  

## 2018-06-17 ENCOUNTER — Encounter: Payer: Self-pay | Admitting: Internal Medicine

## 2018-06-17 ENCOUNTER — Non-Acute Institutional Stay (SKILLED_NURSING_FACILITY): Payer: Medicare Other | Admitting: Internal Medicine

## 2018-06-17 DIAGNOSIS — K219 Gastro-esophageal reflux disease without esophagitis: Secondary | ICD-10-CM

## 2018-06-17 DIAGNOSIS — C4A30 Merkel cell carcinoma of unspecified part of face: Secondary | ICD-10-CM | POA: Diagnosis not present

## 2018-06-17 DIAGNOSIS — I10 Essential (primary) hypertension: Secondary | ICD-10-CM | POA: Diagnosis not present

## 2018-06-17 DIAGNOSIS — E119 Type 2 diabetes mellitus without complications: Secondary | ICD-10-CM | POA: Diagnosis not present

## 2018-06-17 DIAGNOSIS — E7849 Other hyperlipidemia: Secondary | ICD-10-CM

## 2018-06-17 DIAGNOSIS — Z794 Long term (current) use of insulin: Secondary | ICD-10-CM

## 2018-06-17 NOTE — Progress Notes (Signed)
Provider: Veleta Miners MD  Location:    [place] Room Number: 155/P Place of Service:  SNF (31)  PCP: [name], MD Patient Care Team: [name], MD as PCP - General [facility])  Extended Emergency Contact Information Primary Emergency Contact: Perrin Smack, [place] [address] Johnnette Litter of [place] Phone: [phone] Mobile Phone: [phone] Relation: Granddaughter Secondary Emergency Contact: Lupita Raider, [place] [address] Johnnette Litter of [place] Phone: [phone] Work Phone: [phone] Mobile Phone: [phone] Relation: Daughter  Code Status: Full Code Goals of Care: Advanced Directive information Advanced Directives 06/17/2018  Does Patient Have a Medical Advance Directive? Yes  Type of Advance Directive (No Data)  Does patient want to make changes to medical advance directive? No - Patient declined  Copy of [place] in Chart? -  Pre-existing out of facility DNR order (yellow form or pink MOST form) -      Chief Complaint  Patient presents with  . New Admit To SNF    New Admission Visit    HPI: Patient is a 78 y.o. male seen today for admission to SNF for therapy. Patient was admitted in [place] from 01/04-01/10 for right hip fracture. 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 Patient had a mechanical fall at home he was transferred to [facility] as the imaging was consistent with metastatic lesion. He underwent ORIF on 01/07 with Biopsy.  Biopsy was consistent with Merkel cell carcinoma.  He did well postop.  He is WBAT.  And is now in SNF for therapy. Did develop bloody vision in his left eye and was seen by ophthalmology.  CT was negative for any acute.  Optho work with his glasses to help him deal with diplopia Did have a PET  scan done on 1/13.  And he was told that his cancer has spread.  I do not have the results for that. Patient's main complaint is pain in the in his hip.  He is working with therapy and  is able to walk few steps with a walker and is able to transfer himself. Per nurses patient has been more depressed . He has reduced appetite. Patient denied any cough, chest pain or shortness of breath.  He does have off-and-on nausea.  No abdominal pain or constipation Patient lives with his wife.  He was very active and driving and was remaining caregiver for his wife  Past Medical History:  Diagnosis Date  . 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 Arthritis   . Cancer ([place])    skin cancer  . Chronic low back pain   . Complication of anesthesia   . Diabetes mellitus   . GERD (gastroesophageal reflux disease)   . Hyperlipemia   . Hypertension   . Merkel cell carcinoma of face ([place]) 02/18/2016    Followed by [place], underwent surgery now undergoing radiation September 2017  . PONV (postoperative nausea and vomiting)   . 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 BIOPSY  01/16/2012   Procedure: BIOPSY;  Surgeon: Rogene Houston, MD;  [facility];  Service: Endoscopy;  Laterality:  N/A;  . CATARACT EXTRACTION Right 7/06  . CATARACT EXTRACTION W/PHACO Left 01/05/2014   Procedure: CATARACT EXTRACTION PHACO AND INTRAOCULAR LENS PLACEMENT (IOC);  Surgeon: Tonny Branch, MD;  Location: [facility] ORS;  Service: Ophthalmology;  Laterality: Left;  CDE:15.98  . COLONOSCOPY  01/29/12   abnormal repeat in one year  . ESOPHAGOGASTRODUODENOSCOPY    . KNEE ARTHROSCOPY     2 right knee surgeries and 1 left knee surgery  . RIGHT/LEFT HEART CATH AND CORONARY ANGIOGRAPHY N/A 11/25/2017   Procedure: RIGHT/LEFT HEART CATH AND CORONARY ANGIOGRAPHY;   Surgeon: Burnell Blanks, MD;  Location: [place] CV LAB;  Service: Cardiovascular;  Laterality: N/A;  . TEE WITHOUT CARDIOVERSION N/A 12/22/2017   Procedure: TRANSESOPHAGEAL ECHOCARDIOGRAM (TEE);  Surgeon: Burnell Blanks, MD;  Location: [place];  Service: Open Heart Surgery;  Laterality: N/A;  . TONSILLECTOMY    . TRANSCATHETER AORTIC VALVE REPLACEMENT, TRANSFEMORAL N/A 12/22/2017   Procedure: TRANSCATHETER AORTIC VALVE REPLACEMENT, TRANSFEMORAL;  Surgeon: Burnell Blanks, MD;  Location: [place];  Service: Open Heart Surgery;  Laterality: N/A;  . VASECTOMY      reports that he has never smoked. He has never used smokeless tobacco. He reports that he does not drink alcohol or use drugs. Social History   Socioeconomic History  . Marital status: Married    Spouse name: Not on file  . Number of children: Not on file  . Years of education: Not on file  . Highest education level: Not on file  Occupational History  . Not on file  Social Needs  . Financial resource strain: Not on file  . Food insecurity:    Worry: Not on file    Inability: Not on file  . Transportation needs:    Medical: Not on file    Non-medical: Not on file  Tobacco Use  . Smoking status: Never Smoker  . Smokeless tobacco: Never Used  . Tobacco comment: From spouse. Wife does not smoke around him now  Substance and Sexual Activity  . Alcohol use: No    Alcohol/week: 0.0 standard drinks  . Drug use: No  . Sexual activity: Not on file  Lifestyle  . Physical activity:    Days per week: Not on file    Minutes per session: Not on file  . Stress: Not on file  Relationships  . Social connections:    Talks on phone: Not on file    Gets together: Not on file    Attends religious service: Not on file    Active member of club or organization: Not on file    Attends meetings of clubs or organizations: Not on file    Relationship status: Not on file  . Intimate partner violence:    Fear of  current or ex partner: Not on file    Emotionally abused: Not on file    Physically abused: Not on file    Forced sexual activity: Not on file  Other Topics Concern  . Not on file  Social History Narrative  . Not on file    Functional Status Survey:    Family History  Problem Relation Age of Onset  . Diabetes Sister   . Heart disease Sister   . Atrial fibrillation Sister   . Heart disease Other   . Diabetes Other   . Depression Maternal Grandfather     Health Maintenance  Topic Date Due  . FOOT EXAM  01/22/2018  . OPHTHALMOLOGY EXAM  07/15/2018 (Originally 02/16/2015)  .  TETANUS/TDAP  07/15/2018 (Originally 03/26/2008)  . URINE MICROALBUMIN  09/01/2018  . HEMOGLOBIN A1C  09/07/2018  . INFLUENZA VACCINE  Completed  . PNA vac Low Risk Adult  Completed    Allergies  Allergen Reactions  . Cortisone Other (See Comments)    swelling  . Tetanus Toxoids Other (See Comments)    Swelling, not specified  . Hydrocodone-Homatropine Nausea And Vomiting    Outpatient Encounter Medications as of 06/17/2018  Medication Sig  . acetaminophen (TYLENOL) 500 MG tablet Take 1,000 mg by mouth 3 (three) times daily.  Marland Kitchen aspirin EC 81 MG tablet Take 81 mg by mouth daily.  . benzonatate (TESSALON) 200 MG capsule Take 200 mg by mouth 3 (three) times daily as needed for cough.  . diphenoxylate-atropine (LOMOTIL) 2.5-0.025 MG tablet Take 1 tablet by mouth 4 (four) times daily as needed for diarrhea or loose stools.  . empagliflozin (JARDIANCE) 25 MG TABS tablet Take 25 mg by mouth daily.  Marland Kitchen enoxaparin (LOVENOX) 40 MG/0.4ML injection Inject 40 mg into the skin daily.  . ergocalciferol (VITAMIN D2) 1.25 MG (50000 UT) capsule Take 50,000 Units by mouth once a week.  Marland Kitchen glipiZIDE (GLUCOTROL) 5 MG tablet Take 2.5 mg by mouth daily before breakfast.  . ibuprofen (ADVIL,MOTRIN) 200 MG tablet Take 200 mg by mouth daily as needed.  . insulin glargine (LANTUS) 100 UNIT/ML injection Inject 10 Units into the  skin at bedtime.  . metoprolol succinate (TOPROL-XL) 25 MG 24 hr tablet Take 1 tablet (25 mg total) by mouth daily.  Marland Kitchen omeprazole (PRILOSEC) 20 MG capsule Take 20 mg by mouth daily.  . ondansetron (ZOFRAN) 4 MG tablet Take 4 mg by mouth every 8 (eight) hours as needed for nausea or vomiting.  Marland Kitchen oxyCODONE (OXY IR/ROXICODONE) 5 MG immediate release tablet Give 1 tablet by mouth every 4 hours prn for pain level of 1-5. Give 2 tablets by mouth every 4 hours prn for pain level of 6-10  . polyethylene glycol (MIRALAX / GLYCOLAX) packet Take 17 g by mouth 2 (two) times daily.  Marland Kitchen senna (SENOKOT) 8.6 MG tablet Take 1 tablet by mouth at bedtime as needed for constipation.  . [DISCONTINUED] clopidogrel (PLAVIX) 75 MG tablet Take 75 mg by mouth every evening.   No facility-administered encounter medications on file as of 06/17/2018.      Review of Systems  Constitutional: Positive for activity change and appetite change.  HENT: Negative.   Respiratory: Negative.   Cardiovascular: Negative.   Gastrointestinal: Positive for nausea.  Genitourinary: Negative.   Musculoskeletal: Positive for arthralgias and myalgias.  Skin: Negative.   Neurological: Negative.   Psychiatric/Behavioral: Positive for dysphoric mood and sleep disturbance.    Vitals:   06/17/18 0938  BP: 113/68  Pulse: 91  Resp: 20  Temp: 98.4 F (36.9 C)  TempSrc: Oral  SpO2: 94%   There is no height or weight on file to calculate BMI. Physical Exam Constitutional:      Appearance: Normal appearance.  HENT:     Head: Normocephalic.     Nose: Nose normal.     Mouth/Throat:     Mouth: Mucous membranes are dry.     Pharynx: Oropharynx is clear.  Eyes:     Pupils: Pupils are equal, round, and reactive to light.  Neck:     Musculoskeletal: Neck supple.  Cardiovascular:     Rate and Rhythm: Normal rate and regular rhythm.     Heart sounds: Murmur present.  Pulmonary:  Effort: Pulmonary effort is normal. No respiratory  distress.     Breath sounds: Normal breath sounds. No wheezing or rales.  Abdominal:     General: Abdomen is flat. Bowel sounds are normal. There is no distension.     Palpations: Abdomen is soft.     Tenderness: There is no abdominal tenderness. There is no guarding.  Musculoskeletal:        General: No swelling.  Skin:    General: Skin is warm and dry.  Neurological:     General: No focal deficit present.     Mental Status: He is alert and oriented to person, place, and time.  Psychiatric:        Mood and Affect: Mood normal.        Thought Content: Thought content normal.     Labs reviewed: Basic Metabolic Panel: Recent Labs    12/23/17 0512  06/05/18 0203 06/05/18 0526 06/14/18 0843  NA 136   < > 133* 136 136  K 4.0   < > 3.6 3.6 3.8  CL 103   < > 107 108 102  CO2 22   < > 18* 18* 24  GLUCOSE 137*   < > 150* 119* 133*  BUN 16   < > 29* 25* 20  CREATININE 1.18   < > 1.16 1.03 0.89  CALCIUM 9.2   < > 8.6* 8.9 8.7*  MG 2.0  --   --   --   --    < > = values in this interval not displayed.   Liver Function Tests: Recent Labs    08/31/17 0806 12/18/17 1254  AST 28 23  ALT 14 20  ALKPHOS 64 56  BILITOT 0.5 1.0  PROT 7.2 7.0  ALBUMIN 4.6 4.0   No results for input(s): LIPASE, AMYLASE in the last 8760 hours. No results for input(s): AMMONIA in the last 8760 hours. CBC: Recent Labs    06/05/18 0203 06/05/18 0526 06/14/18 0854  WBC 10.5 8.6 9.4  NEUTROABS 8.1* 5.9 6.3  HGB 14.0 13.7 11.4*  HCT 42.9 43.1 37.0*  MCV 89.9 90.4 93.7  PLT 187 188 395   Cardiac Enzymes: Recent Labs    06/05/18 0203 06/05/18 0738 06/05/18 1356  TROPONINI <0.03 <0.03 <0.03   BNP: Invalid input(s): POCBNP Lab Results  Component Value Date   HGBA1C 6.6 (A) 03/08/2018   Lab Results  Component Value Date   TSH 2.530 12/30/2017   No results found for: VITAMINB12 No results found for: FOLATE No results found for: IRON, TIBC, FERRITIN  Imaging and Procedures obtained  prior to SNF admission: No results found.  Assessment/Plan  S/P Right Hip fracture with ORIF  With h/o Metastatic disease and pain not controlled. Will increase his Oxycodone to 10 mg for mild and 25 mg for severe pain Robaxin Prn for muscle spasms WBAT On Lovenox for 30 days for DVT Started on Vit D Type 2 diabetes mellitus  Aic Was 6.6 Lantus dose reduced to 10 units Invokana Stopped On jardiance and Glucotrol BS doing good  Essential hypertension, BP controlled Lisinopril was discontinued in hospital  Merkel cell carcinoma of face with metastasis Awaiting PET scan detail report Follows with Oncology in [place]  Hyperlipidemia On statin S/P TAVR Plavix is stopped On ASpirin GERD On Prilosec   Family/ staff Communication:   Labs/tests ordered: Total time spent in this patient care encounter was 45_ minutes; greater than 50% of the visit spent counseling patient, reviewing records ,  Labs and coordinating care for problems addressed at this encounter.

## 2018-06-18 ENCOUNTER — Other Ambulatory Visit: Payer: Self-pay

## 2018-06-18 MED ORDER — OXYCODONE HCL 5 MG PO TABS: ORAL_TABLET | ORAL | 0 refills | Status: DC

## 2018-06-18 NOTE — Telephone Encounter (Signed)
RX Fax for [facility]@ [phone]  

## 2018-06-21 ENCOUNTER — Non-Acute Institutional Stay (SKILLED_NURSING_FACILITY): Payer: Medicare Other | Admitting: Internal Medicine

## 2018-06-21 ENCOUNTER — Encounter: Payer: Self-pay | Admitting: Internal Medicine

## 2018-06-21 DIAGNOSIS — Z952 Presence of prosthetic heart valve: Secondary | ICD-10-CM | POA: Diagnosis not present

## 2018-06-21 DIAGNOSIS — C4A30 Merkel cell carcinoma of unspecified part of face: Secondary | ICD-10-CM | POA: Diagnosis not present

## 2018-06-21 DIAGNOSIS — S72001A Fracture of unspecified part of neck of right femur, initial encounter for closed fracture: Secondary | ICD-10-CM | POA: Diagnosis not present

## 2018-06-21 DIAGNOSIS — E119 Type 2 diabetes mellitus without complications: Secondary | ICD-10-CM

## 2018-06-21 DIAGNOSIS — Z794 Long term (current) use of insulin: Secondary | ICD-10-CM

## 2018-06-21 DIAGNOSIS — I10 Essential (primary) hypertension: Secondary | ICD-10-CM | POA: Diagnosis not present

## 2018-06-21 NOTE — Progress Notes (Signed)
Location:    [facility] Room Number: 155/P Place of Service:  SNF (31) Provider:  Ewell Poe, MD  Patient Care Team: [name], MD as PCP - General [facility] Medicine)  Extended Emergency Contact Information Primary Emergency Contact: Perrin Smack, [place] [address] [address]net[address] Litter of [place] Phone: [phone] Mobile Phone: [phone] Relation: Granddaughter Secondary Emergency Contact: Lupita Raider, [place] [address] Johnnette Litter of [place] Phone: [phone] Work Phone: [phone] Mobile Phone: [phone] Relation: Daughter  Code Status:  Full Code Goals of care: Advanced Directive information Advanced Directives 06/21/2018  Does Patient Have a Medical Advance Directive? Yes  Type of Advance Directive (No Data)  Does patient want to make changes to medical advance directive? No - Patient declined  Copy of [place] in Chart? -  Pre-existing out of facility DNR order (yellow form or pink MOST form) -    Chief complaint-acute visit secondary to family questions about therapy- blood pressure management- pain management.     HPI:  Pt is a 78 y.o. male seen today for an acute visit for  Several issues as noted above.  Patient was recently admitted to the facility for therapy.  He has a complicated medical history including recent admission to [facility] for right hip fracture.  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  Patient apparently had a fall at home was transferred to [facility] as imaging was consistent with a metastatic lesion.  He underwent an ORIF on January 7 with biopsy biopsy was consistent with Merkel cell carcinoma-apparently postop he did well and now is in skilled nursing for therapy.  Early he  developed double vision in his left eye it was seen by ophthalmology CT was negative for anything acute ophthalmologic work with the glasses were done to help him see better with the diplopia.  Continues to complain of diplopia.  PET scan done on January 13 showed that he had diffuse metastasis  Apparently also at times he is felt to have some low blood pressure per speaking with his granddaughter today.  She states she was put on Lopressor for hypertension at one point when he went to the ER but she thinks that might be a bit too much.  Patient does state he does have some dizziness at times during therapy although unclear what his blood pressure is during therapy.  Manual blood pressure today was 112/52 I see previous reading 104/62-and 112/68.  According to his granddaughter also at times she will complain of pain he does have an order for oxycodone 10 mg every 4 hours as needed as well as 15 mg for severe pain PRN.  His granddaughter is concerned about therapy fearing he may have a further pathological fracture with significant therapy.  I did discuss this with Dr. Lyndel Safe who also discussed this with therapy- and at this point it is thought the benefit of therapy outweighs the risk but they will have to be careful during therapy.  I did discuss this with his granddaughter who expressed understanding realizing that this is somewhat of a challenging situation.  Currently patient is resting in bed comfortably at this point he says his pain is controlled but according to granddaughter he will  not ask for pain medication and eventually pain becomes almost uncontrollable.  Vital signs appear to be stable  Past Medical History:  Diagnosis Date  . 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 Arthritis   . Cancer ([place])    skin cancer  . Chronic low back pain   . Complication of anesthesia   . Diabetes mellitus   . GERD (gastroesophageal reflux disease)   . Hyperlipemia   . Hypertension   .  Merkel cell carcinoma of face ([place]) 02/18/2016    Followed by [facility], underwent surgery now undergoing radiation September 2017  . PONV (postoperative nausea and vomiting)   . 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 BIOPSY  01/16/2012   Procedure: BIOPSY;  Surgeon: Rogene Houston, MD;  [facility] ENDO [facility];  Service: Endoscopy;  Laterality: N/A;  . CATARACT EXTRACTION Right 7/06  . CATARACT EXTRACTION W/PHACO Left 01/05/2014   Procedure: CATARACT EXTRACTION PHACO AND INTRAOCULAR LENS PLACEMENT (IOC);  Surgeon: Tonny Branch, MD;  Location: [facility] ORS;  Service: Ophthalmology;  Laterality: Left;  CDE:15.98  . COLONOSCOPY  01/29/12   abnormal repeat in one year  . ESOPHAGOGASTRODUODENOSCOPY    . KNEE ARTHROSCOPY     2 right knee surgeries and 1 left knee surgery  . RIGHT/LEFT HEART CATH AND CORONARY ANGIOGRAPHY N/A 11/25/2017   Procedure: RIGHT/LEFT HEART CATH AND CORONARY ANGIOGRAPHY;  Surgeon: Burnell Blanks, MD;  Location: [place] CV LAB;  Service: Cardiovascular;  Laterality: N/A;  . TEE WITHOUT CARDIOVERSION N/A 12/22/2017   Procedure: TRANSESOPHAGEAL ECHOCARDIOGRAM (TEE);  Surgeon: Burnell Blanks, MD;  Location: [facility];  Service: Open Heart Surgery;  Laterality: N/A;  . TONSILLECTOMY    . TRANSCATHETER AORTIC VALVE REPLACEMENT, TRANSFEMORAL N/A 12/22/2017   Procedure: TRANSCATHETER AORTIC VALVE REPLACEMENT, TRANSFEMORAL;  Surgeon: Burnell Blanks, MD;  Location: [facility];  Service: Open Heart Surgery;  Laterality: N/A;  . VASECTOMY      Allergies  Allergen Reactions  . Cortisone Other (See Comments)    swelling  . Tetanus Toxoids Other (See Comments)    Swelling, not specified  . Hydrocodone-Homatropine Nausea And Vomiting    Outpatient Encounter Medications as of 06/21/2018    Medication Sig  . acetaminophen (TYLENOL) 500 MG tablet Take 1,000 mg by mouth 3 (three) times daily.  Marland Kitchen aspirin EC 81 MG tablet Take 81 mg by mouth daily.  . benzonatate (TESSALON) 200 MG capsule Take 200 mg by mouth 3 (three) times daily as needed for cough.  . clopidogrel (PLAVIX) 75 MG tablet Take 75 mg by mouth every evening.  . diphenoxylate-atropine (LOMOTIL) 2.5-0.025 MG tablet Take 1 tablet by mouth 4 (four) times daily as needed for diarrhea or loose stools.  . empagliflozin (JARDIANCE) 25 MG TABS tablet Take 25 mg by mouth daily.  Marland Kitchen enoxaparin (LOVENOX) 40 MG/0.4ML injection Inject 40 mg into the skin daily.  . ergocalciferol (VITAMIN D2) 1.25 MG (50000 UT) capsule Take 50,000 Units by mouth once a week.  Marland Kitchen glipiZIDE (GLUCOTROL) 5 MG tablet Take 2.5 mg by mouth 2 (two) times daily before a meal.   . insulin glargine (LANTUS) 100 UNIT/ML injection Inject 10 Units into the skin at bedtime.  . lovastatin (MEVACOR) 20 MG tablet Take 20 mg by mouth every evening.  . methocarbamol (ROBAXIN) 500 MG tablet Take 500 mg  by mouth 3 (three) times daily as needed for muscle spasms.  . metoprolol succinate (TOPROL-XL) 25 MG 24 hr tablet Take 1 tablet (25 mg total) by mouth daily.  Marland Kitchen omeprazole (PRILOSEC) 20 MG capsule Take 20 mg by mouth daily.  Marland Kitchen oxyCODONE (OXY IR/ROXICODONE) 5 MG immediate release tablet Give 2 tablets (10 mg) by mouth every 4 hours prn for pain level of 1-5. Give 3 tablets (15 mg) by mouth every 4 hours prn for pain level of 6-10  . polyethylene glycol (MIRALAX / GLYCOLAX) packet Take 17 g by mouth 2 (two) times daily.  Marland Kitchen senna (SENOKOT) 8.6 MG tablet Take 2 tablets by mouth at bedtime as needed for constipation.   . [DISCONTINUED] ibuprofen (ADVIL,MOTRIN) 200 MG tablet Take 200 mg by mouth daily as needed.  . [DISCONTINUED] ondansetron (ZOFRAN) 4 MG tablet Take 4 mg by mouth every 8 (eight) hours as needed for nausea or vomiting.   No facility-administered encounter  medications on file as of 06/21/2018.     Review of Systems   General is not complain of fever chills he does have weakness and says his appetite is not great.  Skin does not complain of rashes or itching.  Head ears eyes nose mouth and throat is not complaining of sore throat continues to complain of diplopia.  Respiratory is not complaining of shortness of breath or cough.  Cardiac does not complain of chest pain or edema.  GI is not complaining of abdominal pain vomiting constipation or diarrhea apparently has had some nausea in the past.  GU does not complain of dysuria.  Musculoskeletal does at times complain of significant pain apparently more so of his hip- at this point is not complaining of pain but apparently this is a significant issue per discussion with his granddaughter.  Neurologic does have weakness is not complain of dizziness headache or syncope at this time says he gets a bit dizzy during therapy at times.  Psych does not complain of being overtly depressed or anxious but apparently at times has exhibited dysphoric mood.    Immunization History  Administered Date(s) Administered  . Influenza Split 03/17/2013  . Influenza Whole 04/01/2012  . Influenza,inj,Quad PF,6+ Mos 03/21/2014, 02/22/2015, 02/26/2016, 03/11/2017, 03/03/2018  . Pneumococcal Conjugate-13 12/20/2013  . Pneumococcal-Unspecified 02/27/2006  . Td 03/26/1998  . Zoster 09/30/2007   Pertinent  Health Maintenance Due  Topic Date Due  . FOOT EXAM  01/22/2018  . OPHTHALMOLOGY EXAM  07/15/2018 (Originally 02/16/2015)  . URINE MICROALBUMIN  09/01/2018  . HEMOGLOBIN A1C  09/07/2018  . INFLUENZA VACCINE  Completed  . PNA vac Low Risk Adult  Completed   Fall Risk  09/09/2017 09/09/2017 06/12/2016 09/10/2015 03/08/2015  Falls in the past year? No No No Yes No  Number falls in past yr: - - - 1 -  Injury with Fall? - - - Yes -  Follow up - - - Falls prevention discussed -   Functional Status Survey:      Vitals:   06/21/18 1436  BP: 130/74  Pulse: 78  Resp: 18  Temp: 97.7 F (36.5 C)  TempSrc: Oral  SpO2: 95%  --Manual blood pressure today lying was 112/52 Physical Exam   In general this is a pleasant elderly male in no distress lying at this point comfortably in his bed.  His skin is warm and dry.  Eyes he continues to complain of diplopia he can make out I am holding a correct number of fingers but says  it looks a little blurred and doubled.  Sclera and conjunctival are clear.  Pupils appear to be reactive.  Oropharynx is clear mucous membranes appear fairly moist.  Chest is clear to auscultation there is no labored breathing.  Heart is regular rate and rhythm with a slight systolic murmur he does not really have significant lower extremity edema.  His abdomen is soft nontender with positive bowel sounds.  Musculoskeletal Limited exam since she is in bed but appears able to move all extremities x4 with lower extremity weakness especially on the left where the hip surgery was done.  There is covering over the left hip surgical site   Neurologic appears grossly intact his speech is clear.  Psych he is alert and oriented pleasant and appropriate  Labs reviewed: Recent Labs    12/23/17 0512  06/05/18 0203 06/05/18 0526 06/14/18 0843  NA 136   < > 133* 136 136  K 4.0   < > 3.6 3.6 3.8  CL 103   < > 107 108 102  CO2 22   < > 18* 18* 24  GLUCOSE 137*   < > 150* 119* 133*  BUN 16   < > 29* 25* 20  CREATININE 1.18   < > 1.16 1.03 0.89  CALCIUM 9.2   < > 8.6* 8.9 8.7*  MG 2.0  --   --   --   --    < > = values in this interval not displayed.   Recent Labs    08/31/17 0806 12/18/17 1254  AST 28 23  ALT 14 20  ALKPHOS 64 56  BILITOT 0.5 1.0  PROT 7.2 7.0  ALBUMIN 4.6 4.0   Recent Labs    06/05/18 0203 06/05/18 0526 06/14/18 0854  WBC 10.5 8.6 9.4  NEUTROABS 8.1* 5.9 6.3  HGB 14.0 13.7 11.4*  HCT 42.9 43.1 37.0*  MCV 89.9 90.4 93.7  PLT 187 188  395   Lab Results  Component Value Date   TSH 2.530 12/30/2017   Lab Results  Component Value Date   HGBA1C 6.6 (A) 03/08/2018   Lab Results  Component Value Date   CHOL 137 08/31/2017   HDL 43 08/31/2017   LDLCALC 80 08/31/2017   TRIG 68 08/31/2017   CHOLHDL 3.2 08/31/2017    Significant Diagnostic Results in last 30 days:  Dg Knee 2 Views Right  Result Date: 06/05/2018 CLINICAL DATA:  Slip and fall landing on hardwood floor today, right hip pain radiating to the knee. EXAM: RIGHT KNEE - 1-2 VIEW COMPARISON:  Radiograph 06/01/2018 FINDINGS: No acute fracture or dislocation. Tricompartmental osteoarthritis with peripheral spurring. Marked medial tibiofemoral joint space narrowing. Chondrocalcinosis most prominent in the lateral tibiofemoral compartment. Intra-articular ossified bodies posteriorly in the knee joint. Trace joint effusion. IMPRESSION: Tricompartmental osteoarthritis without acute fracture or dislocation. Electronically Signed   By: Keith Rake M.D.   On: 06/05/2018 03:00   Ct Hip Right Wo Contrast  Result Date: 06/05/2018 CLINICAL DATA:  Right hip pain. Status post fall on a hardwood floor yesterday. Initial encounter. EXAM: CT OF THE RIGHT HIP WITHOUT CONTRAST TECHNIQUE: Multidetector CT imaging of the right hip was performed according to the standard protocol. Multiplanar CT image reconstructions were also generated. COMPARISON:  CT chest, abdomen and pelvis 12/08/2017. FINDINGS: Bones/Joint/Cartilage The patient has an acute fracture through the base of the femoral neck. There is also a nondisplaced and incomplete fracture in the greater trochanter. Multiple lucencies are seen through the cortex of the  femoral neck and intertrochanteric femur and proximal metaphysis. These are new since the prior CT scan and not present on the left or elsewhere in the imaged pelvis. The femoral head is located. Ligaments Suboptimally assessed by CT. Muscles and Tendons Appear intact.  Rounded density anterior to the proximal diaphysis of the femur in the vastus lateralis measures 4.1 cm AP x 5.6 cm transverse by approximately 8 cm craniocaudal. Soft tissues Atherosclerosis noted. IMPRESSION: Acute fracture through the base of the right femoral neck with extension into the greater trochanter. Lucencies in the cortex of the right femoral neck and intertrochanteric right femur which are new since the comparison examination and not present on the left raise the possibility of a pathologic fracture. Rounded soft tissue area anterior to the right femur in the vastus lateralis is likely a hematoma in this patient who is on aspirin but could represent a soft tissue mass. Electronically Signed   By: Inge Rise M.D.   On: 06/05/2018 08:53   Dg Chest Portable 1 View  Result Date: 06/05/2018 CLINICAL DATA:  Chest pain, fall with right hip fracture. EXAM: PORTABLE CHEST 1 VIEW COMPARISON:  05/04/2018 FINDINGS: The cardiomediastinal contours are normal. Post TAVR. Pulmonary vasculature is normal. No consolidation, pleural effusion, or pneumothorax. No acute osseous abnormalities are seen. IMPRESSION: No acute chest findings. Electronically Signed   By: Keith Rake M.D.   On: 06/05/2018 03:42   Dg Hip Unilat W Or Wo Pelvis 2-3 Views Right  Result Date: 06/05/2018 CLINICAL DATA:  Slip and fall landing on hardwood floor today, right hip pain radiating to the knee. EXAM: DG HIP (WITH OR WITHOUT PELVIS) 2-3V RIGHT COMPARISON:  None. FINDINGS: Mildly displaced transcervical fracture of the right femoral neck. Femoral head remains seated. No significant angulation. No additional fracture. Pubic rami are intact. Pubic symphysis and sacroiliac joints are congruent. IMPRESSION: Mildly displaced transcervical right femoral neck fracture. Electronically Signed   By: Keith Rake M.D.   On: 06/05/2018 02:58   Dg Femur Min 2 Views Right  Result Date: 06/05/2018 CLINICAL DATA:  Slip and fall landing  on hardwood floor today, right hip pain radiating to the knee. EXAM: RIGHT FEMUR 2 VIEWS COMPARISON:  None. FINDINGS: Mildly displaced transcervical femoral neck fracture. The distal femur is intact. No additional fracture. Degenerative change of the knee, better characterized on concurrent knee radiographs. IMPRESSION: Mildly displaced transcervical femoral neck fracture. Distal femur is intact. Electronically Signed   By: Keith Rake M.D.   On: 06/05/2018 02:59   Dg Knee 3 Views Right  Result Date: 06/01/2018 Clinical:  Pain right knee for two weeks, no trauma X-rays were done of the right knee, three views. 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  There is severe changes of the medial compartment with bone on bone narrowing, small osteophyte formation.  No fracture is noted.  Bone quality is good. 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 Electronically Signed Sanjuana Kava, MD 12/31/20199:01 AM    Assessment/Plan  #1 history of right hip fracture with ORIF-she does have a history of metastatic disease per PET scan there has been diffuse progression.  He currently has oxycodone 10 mg every 4 hours and apparently an additional 15 mg for severe pain.  Will make his oxycodone 10 mg routine twice a day and continue the PRN every 4 hours-hopefully this will give a little better routine control-hold for any sedation or respiratory depression.  He also  continues on Lovenox for DVT prophylaxis and is on Robaxin PRN.  As noted above I did discuss therapy with his granddaughter- this was discussed with Dr. Lyndel Safe as well and physical therapy has been consulted- has been decided he  would benefit from therapy but there will have to be care involved- again patient does remain at risk for further fractures and this continues to be a challenging situation and his granddaughter has expressed  understanding    2.  Hypertension-this appears to be controlled actually borderline on the lower end-apparently has complained of some dizziness at times and his granddaughter is concerned that the metoprolol dose may be a bit much-I would tend to agree will decrease the metoprolol down to 12.5 mg nightly- and monitor-hopefully we can get orthostatic blood pressures once a day for little better input on this.  3.-  History of diplopia-he has been evaluated by ophthalmology at [facility]- I did discuss this with his granddaughter as well and granddaughter will try to arrange follow-up at [facility] for this- he did have a CT scan of the head which apparently did not really show any acute process-again this will warrant follow-up at [facility].  She also has arranged an upcoming eye appointment and has been able to expediently move this appointment up.   4 history of diabetes type 2 continues on Glucotrol 2.5 mg twice daily as well as Lantus 10 units every afternoon and Jardiance 25 mg a day-this appears to be relatively  well controlled with blood sugars in the lower mid 100s in the morning and at 9 PM somewhat more variable ranging from 132 up to 243 I do not see consistent elevations above 200  #5 history of Merkel cell carcinoma of face with metastasis- again PET scan did show diffuse metastasis-this again will need follow-up expediently with oncology at [facility].  6.  Status post TAVR is on aspirin I see per cardiology note that Plavix should be discontinued on January 24.  Will write an order for that.     Again patient will need expedient follow-up at [facility] granddaughter has been quite diligent trying to line this up she states she has had some difficulty with this-I did express that if she continues to have difficulty to notify us and we will certainly try to help in this matter.  [id number] note greater than 35 minutes spent assessing patient- discussing his  status via phone x2 with his [name]  as well as reviewing his chart and labs- coordinating and formulating a plan of care- including discussion with Dr. Lyndel Safe --of note greater than 50% of time spent coordinating a plan of care with input as noted above

## 2018-06-24 ENCOUNTER — Ambulatory Visit: Payer: Medicare Other | Admitting: Family Medicine

## 2018-06-28 ENCOUNTER — Encounter: Payer: Self-pay | Admitting: Internal Medicine

## 2018-06-28 ENCOUNTER — Other Ambulatory Visit (HOSPITAL_COMMUNITY)
Admission: RE | Admit: 2018-06-28 | Discharge: 2018-06-28 | Disposition: A | Discharge status: Home / Self Care | Payer: Medicare Other | Source: Skilled Nursing Facility | Attending: Internal Medicine | Admitting: Internal Medicine

## 2018-06-28 ENCOUNTER — Other Ambulatory Visit: Payer: Self-pay | Admitting: Family Medicine

## 2018-06-28 ENCOUNTER — Non-Acute Institutional Stay (SKILLED_NURSING_FACILITY): Payer: Medicare Other | Admitting: Internal Medicine

## 2018-06-28 DIAGNOSIS — E119 Type 2 diabetes mellitus without complications: Secondary | ICD-10-CM | POA: Insufficient documentation

## 2018-06-28 DIAGNOSIS — I10 Essential (primary) hypertension: Secondary | ICD-10-CM

## 2018-06-28 DIAGNOSIS — S72001A Fracture of unspecified part of neck of right femur, initial encounter for closed fracture: Secondary | ICD-10-CM | POA: Diagnosis not present

## 2018-06-28 DIAGNOSIS — Z794 Long term (current) use of insulin: Secondary | ICD-10-CM

## 2018-06-28 DIAGNOSIS — C4A9 Merkel cell carcinoma, unspecified: Secondary | ICD-10-CM | POA: Insufficient documentation

## 2018-06-28 DIAGNOSIS — E785 Hyperlipidemia, unspecified: Secondary | ICD-10-CM | POA: Insufficient documentation

## 2018-06-28 DIAGNOSIS — Z952 Presence of prosthetic heart valve: Secondary | ICD-10-CM

## 2018-06-28 DIAGNOSIS — C4A30 Merkel cell carcinoma of unspecified part of face: Secondary | ICD-10-CM | POA: Diagnosis not present

## 2018-06-28 LAB — BASIC METABOLIC PANEL
Anion gap: 10 (ref 5–15)
BUN: 27 mg/dL — ABNORMAL HIGH (ref 8–23)
CO2: 23 mmol/L (ref 22–32)
Calcium: 8.9 mg/dL (ref 8.9–10.3)
Chloride: 99 mmol/L (ref 98–111)
Creatinine, Ser: 1.23 mg/dL (ref 0.61–1.24)
GFR calc Af Amer: >60 mL/min (ref >60)
GFR calc non Af Amer: 56 mL/min — ABNORMAL LOW (ref >60)
Glucose, Bld: 218 mg/dL — ABNORMAL HIGH (ref 70–99)
Potassium: 4.4 mmol/L (ref 3.5–5.1)
Sodium: 132 mmol/L — ABNORMAL LOW (ref 135–145)

## 2018-06-28 LAB — CBC WITH DIFFERENTIAL/PLATELET
Abs Immature Granulocytes: 0.06 10*3/uL (ref 0.00–0.07)
Basophils Absolute: 0 10*3/uL (ref 0.0–0.1)
Basophils Relative: 1 %
Eosinophils Absolute: 0.1 10*3/uL (ref 0.0–0.5)
Eosinophils Relative: 1 %
HCT: 35.3 % — ABNORMAL LOW (ref 39.0–52.0)
Hemoglobin: 11.1 g/dL — ABNORMAL LOW (ref 13.0–17.0)
Immature Granulocytes: 1 %
Lymphocytes Relative: 22 %
Lymphs Abs: 1.6 10*3/uL (ref 0.7–4.0)
MCH: 29.1 pg (ref 26.0–34.0)
MCHC: 31.4 g/dL (ref 30.0–36.0)
MCV: 92.4 fL (ref 80.0–100.0)
Monocytes Absolute: 0.7 10*3/uL (ref 0.1–1.0)
Monocytes Relative: 10 %
Neutro Abs: 4.6 10*3/uL (ref 1.7–7.7)
Neutrophils Relative %: 65 %
PLATELETS: 439 10*3/uL — AB (ref 150–400)
RBC: 3.82 MIL/uL — ABNORMAL LOW (ref 4.22–5.81)
RDW: 14 % (ref 11.5–15.5)
WBC: 7.1 10*3/uL (ref 4.0–10.5)
nRBC: 0 % (ref 0.0–0.2)

## 2018-06-28 MED ORDER — OXYCODONE HCL 10 MG PO TABS: 10.0000 mg | ORAL_TABLET | Freq: Two times a day (BID) | ORAL | 0 refills | Status: DC

## 2018-06-28 MED ORDER — OXYCODONE HCL 10 MG PO TABS: 10.0000 mg | ORAL_TABLET | ORAL | 0 refills | Status: DC | PRN

## 2018-06-28 NOTE — Progress Notes (Signed)
Location:    [facility] Room Number: 155/P Place of Service:  SNF [phone])  Provider: Granville Lewis PA-C  PCP: [name], MD Patient Care Team: [name], MD as PCP - General (Family Medicine)  Extended Emergency Contact Information Primary Emergency Contact: Perrin Smack, [place] [address] Johnnette Litter of [place] Phone: [phone] Mobile Phone: [phone] Relation: Granddaughter Secondary Emergency Contact: Lupita Raider,  [address] Johnnette Litter of [place] Phone: [phone] Work Phone: [phone] Mobile Phone: [phone] Relation: Daughter  Code Status: Full Code Goals of care:  Advanced Directive information Advanced Directives 06/28/2018  Does Patient Have a Medical Advance Directive? Yes  Type of Advance Directive (No Data)  Does patient want to make changes to medical advance directive? No - Patient declined  Copy of [place] in Chart? -  Pre-existing out of facility DNR order (yellow form or pink MOST form) -     Allergies  Allergen Reactions  . Cortisone Other (See Comments)    swelling  . Tetanus Toxoids Other (See Comments)    Swelling, not specified  . Hydrocodone-Homatropine Nausea And Vomiting    Chief Complaint  Patient presents with  . Discharge Note    Discharge Visit    HPI:  78 y.o. male seen today for discharge from facility.  Patient will be going home more he lives with his spouse and granddaughter-.  Patient was here for rehab and therapy.  He has a complicated medical history including an admission recently to [facility] for right hip fracture.  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  Apparently recent course started with a fall at home was transferred to [facility] imaging was  consistent with a metastatic lesion.  He underwent an ORIF on January 7-biopsy was consistent with Merkel cell carcinoma- postop apparently he did fairly well and was sent to skilled nursing for therapy.  Apparently at some point he also developed double vision in his left eye was seen by ophthalmology-CT scan was negative for anything acute- apparently adjustments were made with glasses to help him see better with the diplopia.  He is scheduled to start chemotherapy this week.  In regards to diabetes he is on Glucotrol 2.5 mg twice daily as well as Jardiance 25 mg a day and Lantus 10 units nightly- blood sugars in the morning appear to be more in the mid 100s and fairly consistent later in the day appear to be more in the higher 100s occasionally over 200.   In regards to pain management he appears to be doing well with the oxycodone 10 mg routinely twice daily he seldom takes the PRN dose will take it occasionally.  He continues on Lovenox for DVT prophylaxis.  He also was thought to be somewhat hypotensive at times with dizziness we did lower his Lopressor down to 12.5 mg a day I got a manual blood pressure of 110/56 today.     I do see a listed blood pressure of 96/61 yesterday but see previous ones 133/76- 118/64- this appears to be more baseline he does not complain of dizziness today.        Past Medical History:  Diagnosis Date  . 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 Arthritis   .  Cancer ([place])    skin cancer  . Chronic low back pain   . Complication of anesthesia   . Diabetes mellitus   . GERD (gastroesophageal reflux disease)   . Hyperlipemia   . Hypertension   . Merkel cell carcinoma of face ([place]) 02/18/2016    Followed by [facility], underwent surgery now undergoing radiation September 2017  . PONV (postoperative nausea and vomiting)   . 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 BIOPSY  01/16/2012   Procedure: BIOPSY;  Surgeon: Rogene Houston, MD;  Location: [facility]O [facility];  Service: Endoscopy;  Laterality: N/A;  . CATARACT EXTRACTION Right 7/06  . CATARACT EXTRACTION W/PHACO Left 01/05/2014   Procedure: CATARACT EXTRACTION PHACO AND INTRAOCULAR LENS PLACEMENT (IOC);  Surgeon: Tonny Branch, MD;  Location: [facility] ORS;  Service: Ophthalmology;  Laterality: Left;  CDE:15.98  . COLONOSCOPY  01/29/12   abnormal repeat in one year  . ESOPHAGOGASTRODUODENOSCOPY    . KNEE ARTHROSCOPY     2 right knee surgeries and 1 left knee surgery  . RIGHT/LEFT HEART CATH AND CORONARY ANGIOGRAPHY N/A 11/25/2017   Procedure: RIGHT/LEFT HEART CATH AND CORONARY ANGIOGRAPHY;  Surgeon: Burnell Blanks, MD;  Location: [facility] CV LAB;  Service: Cardiovascular;  Laterality: N/A;  . TEE WITHOUT CARDIOVERSION N/A 12/22/2017   Procedure: TRANSESOPHAGEAL ECHOCARDIOGRAM (TEE);  Surgeon: Burnell Blanks, MD;  Location: [facility];  Service: Open Heart Surgery;  Laterality: N/A;  . TONSILLECTOMY    . TRANSCATHETER AORTIC VALVE REPLACEMENT, TRANSFEMORAL N/A 12/22/2017   Procedure: TRANSCATHETER AORTIC VALVE REPLACEMENT, TRANSFEMORAL;  Surgeon: Burnell Blanks, MD;  Location: [facility];  Service: Open Heart Surgery;  Laterality: N/A;  . VASECTOMY        reports that he has never smoked. He has never used smokeless tobacco. He reports that he does not drink alcohol or use drugs. Social History   Socioeconomic History  . Marital status: Married    Spouse name: Not on file  . Number of children: Not on file  . Years of education: Not on file  . Highest education level: Not on file  Occupational History  . Not on file  Social Needs  . Financial resource strain: Not on file  . Food insecurity:    Worry: Not on file    Inability: Not on file  . Transportation needs:    Medical: Not on file      Non-medical: Not on file  Tobacco Use  . Smoking status: Never Smoker  . Smokeless tobacco: Never Used  . Tobacco comment: From spouse. Wife does not smoke around him now  Substance and Sexual Activity  . Alcohol use: No    Alcohol/week: 0.0 standard drinks  . Drug use: No  . Sexual activity: Not on file  Lifestyle  . Physical activity:    Days per week: Not on file    Minutes per session: Not on file  . Stress: Not on file  Relationships  . Social connections:    Talks on phone: Not on file    Gets together: Not on file    Attends religious service: Not on file    Active member of club or organization: Not on file    Attends meetings of clubs or organizations: Not on file    Relationship status: Not on  file  . Intimate partner violence:    Fear of current or ex partner: Not on file    Emotionally abused: Not on file    Physically abused: Not on file    Forced sexual activity: Not on file  Other Topics Concern  . Not on file  Social History Narrative  . Not on file   Functional Status Survey:    Allergies  Allergen Reactions  . Cortisone Other (See Comments)    swelling  . Tetanus Toxoids Other (See Comments)    Swelling, not specified  . Hydrocodone-Homatropine Nausea And Vomiting    Pertinent  Health Maintenance Due  Topic Date Due  . FOOT EXAM  01/22/2018  . OPHTHALMOLOGY EXAM  07/15/2018 (Originally 02/16/2015)  . URINE MICROALBUMIN  09/01/2018  . HEMOGLOBIN A1C  09/07/2018  . INFLUENZA VACCINE  Completed  . PNA vac Low Risk Adult  Completed    Medications: Outpatient Encounter Medications as of 06/28/2018  Medication Sig  . acetaminophen (TYLENOL) 500 MG tablet Take 1,000 mg by mouth 3 (three) times daily.  Marland Kitchen aspirin EC 81 MG tablet Take 81 mg by mouth daily.  . benzonatate (TESSALON) 200 MG capsule Take 200 mg by mouth 3 (three) times daily as needed for cough.  . diphenoxylate-atropine (LOMOTIL) 2.5-0.025 MG tablet Take 1 tablet by mouth 4 (four)  times daily as needed for diarrhea or loose stools.  . empagliflozin (JARDIANCE) 25 MG TABS tablet Take 25 mg by mouth daily.  Marland Kitchen enoxaparin (LOVENOX) 40 MG/0.4ML injection Inject 40 mg into the skin daily.  . ergocalciferol (VITAMIN D2) 1.25 MG (50000 UT) capsule Take 50,000 Units by mouth once a week.  Marland Kitchen glipiZIDE (GLUCOTROL) 5 MG tablet Take 2.5 mg by mouth 2 (two) times daily before a meal.   . insulin glargine (LANTUS) 100 UNIT/ML injection Inject 10 Units into the skin at bedtime.  . lovastatin (MEVACOR) 20 MG tablet Take 20 mg by mouth every evening.  . methocarbamol (ROBAXIN) 500 MG tablet Take 500 mg by mouth 3 (three) times daily as needed for muscle spasms.  . metoprolol succinate (TOPROL-XL) 25 MG 24 hr tablet Take 12.5 mg by mouth at bedtime.  Marland Kitchen omeprazole (PRILOSEC) 20 MG capsule Take 20 mg by mouth daily.  . Oxycodone HCl 10 MG TABS Take 10 mg by mouth 2 (two) times daily.  . Oxycodone HCl 10 MG TABS Take 20 mg by mouth every 4 (four) hours as needed.  . polyethylene glycol (MIRALAX / GLYCOLAX) packet Take 17 g by mouth 2 (two) times daily.  Marland Kitchen senna (SENOKOT) 8.6 MG tablet Take 2 tablets by mouth at bedtime as needed for constipation.   . [DISCONTINUED] clopidogrel (PLAVIX) 75 MG tablet Take 75 mg by mouth every evening.  . [DISCONTINUED] metoprolol succinate (TOPROL-XL) 25 MG 24 hr tablet Take 1 tablet (25 mg total) by mouth daily. (Patient taking differently: Take 12.5 mg by mouth at bedtime. )  . [DISCONTINUED] oxyCODONE (OXY IR/ROXICODONE) 5 MG immediate release tablet Give 2 tablets (10 mg) by mouth every 4 hours prn for pain level of 1-5. Give 3 tablets (15 mg) by mouth every 4 hours prn for pain level of 6-10 (Patient taking differently: Give 2 tablets (10 mg) by mouth every 4 hours prn. Give 1 tablets (5 mg) by mouth twice a day)   No facility-administered encounter medications on file as of 06/28/2018.      Review of Systems   In general is not complaining of any fever  chills says he is feeling a bit stronger and feels well today is happy to be going home.  Skin does not complain of rashes or itching.  Head ears eyes nose mouth and throat does not complain of visual changes or sore throat.  Respiratory does not complain of shortness of breath or cough.  Cardiac is not complaining of chest pain or increased edema.  GI does not complain of abdominal pain vomiting constipation or diarrhea does continue to complain at times of nausea.  Musculoskeletal has weakness but is not complaining of pain at this point apparently the routine oxycodone is helping.  Neurologic is not complaining of dizziness headache or numbness at this time he does have continued weakness.  Psych appears to be in good spirits is not complaining of being depressed or anxious  Vitals:   06/28/18 1151  BP: 113/62  Pulse: 94  Resp: 18  Temp: 98.2 F (36.8 C)  TempSrc: Oral  SpO2: 95%  Manual blood pressure was 110/56  Physical Exam   In general this is a pleasant elderly male in no distress lying comfortably in bed.  His skin is warm and dry.  Eyes does have some history of diplopia but this appears to be baseline-sclera and conjunctive are clear.  Oropharynx is clear mucous membranes moist.  Chest is clear to auscultation with somewhat shallow air entry there is no labored breathing.  Heart is regular rate and rhythm with a systolic murmur-he does not appear to have significant lower extremity edema.  His abdomen is soft nontender with positive bowel sounds.  Musculoskeletal Limited exam since he is in bed but is able to move all extremities x4 he is weightbearing as tolerated upper extremity strength appears preserved does have lower extremity weakness.  Neurologic appears grossly intact his speech is clear could not appreciate lateralizing findings.  Psych he is alert and oriented pleasant and appropriate  Labs reviewed: Basic Metabolic Panel: Recent Labs     12/23/17 0512  06/05/18 0203 06/05/18 0526 06/14/18 0843  NA 136   < > 133* 136 136  K 4.0   < > 3.6 3.6 3.8  CL 103   < > 107 108 102  CO2 22   < > 18* 18* 24  GLUCOSE 137*   < > 150* 119* 133*  BUN 16   < > 29* 25* 20  CREATININE 1.18   < > 1.16 1.03 0.89  CALCIUM 9.2   < > 8.6* 8.9 8.7*  MG 2.0  --   --   --   --    < > = values in this interval not displayed.   Liver Function Tests: Recent Labs    08/31/17 0806 12/18/17 1254  AST 28 23  ALT 14 20  ALKPHOS 64 56  BILITOT 0.5 1.0  PROT 7.2 7.0  ALBUMIN 4.6 4.0   No results for input(s): LIPASE, AMYLASE in the last 8760 hours. No results for input(s): AMMONIA in the last 8760 hours. CBC: Recent Labs    06/05/18 0203 06/05/18 0526 06/14/18 0854  WBC 10.5 8.6 9.4  NEUTROABS 8.1* 5.9 6.3  HGB 14.0 13.7 11.4*  HCT 42.9 43.1 37.0*  MCV 89.9 90.4 93.7  PLT 187 188 395   Cardiac Enzymes: Recent Labs    06/05/18 0203 06/05/18 0738 06/05/18 1356  TROPONINI <0.03 <0.03 <0.03   BNP: Invalid input(s): POCBNP CBG: Recent Labs    12/23/17 1615 06/05/18 0948 06/05/18 1209  GLUCAP 180* 99 84  Procedures and Imaging Studies During Stay: Dg Knee 2 Views Right  Result Date: 06/05/2018 CLINICAL DATA:  Slip and fall landing on hardwood floor today, right hip pain radiating to the knee. EXAM: RIGHT KNEE - 1-2 VIEW COMPARISON:  Radiograph 06/01/2018 FINDINGS: No acute fracture or dislocation. Tricompartmental osteoarthritis with peripheral spurring. Marked medial tibiofemoral joint space narrowing. Chondrocalcinosis most prominent in the lateral tibiofemoral compartment. Intra-articular ossified bodies posteriorly in the knee joint. Trace joint effusion. IMPRESSION: Tricompartmental osteoarthritis without acute fracture or dislocation. Electronically Signed   By: Keith Rake M.D.   On: 06/05/2018 03:00   Ct Hip Right Wo Contrast  Result Date: 06/05/2018 CLINICAL DATA:  Right hip pain. Status post fall on a hardwood  floor yesterday. Initial encounter. EXAM: CT OF THE RIGHT HIP WITHOUT CONTRAST TECHNIQUE: Multidetector CT imaging of the right hip was performed according to the standard protocol. Multiplanar CT image reconstructions were also generated. COMPARISON:  CT chest, abdomen and pelvis 12/08/2017. FINDINGS: Bones/Joint/Cartilage The patient has an acute fracture through the base of the femoral neck. There is also a nondisplaced and incomplete fracture in the greater trochanter. Multiple lucencies are seen through the cortex of the femoral neck and intertrochanteric femur and proximal metaphysis. These are new since the prior CT scan and not present on the left or elsewhere in the imaged pelvis. The femoral head is located. Ligaments Suboptimally assessed by CT. Muscles and Tendons Appear intact. Rounded density anterior to the proximal diaphysis of the femur in the vastus lateralis measures 4.1 cm AP x 5.6 cm transverse by approximately 8 cm craniocaudal. Soft tissues Atherosclerosis noted. IMPRESSION: Acute fracture through the base of the right femoral neck with extension into the greater trochanter. Lucencies in the cortex of the right femoral neck and intertrochanteric right femur which are new since the comparison examination and not present on the left raise the possibility of a pathologic fracture. Rounded soft tissue area anterior to the right femur in the vastus lateralis is likely a hematoma in this patient who is on aspirin but could represent a soft tissue mass. Electronically Signed   By: Inge Rise M.D.   On: 06/05/2018 08:53   Dg Chest Portable 1 View  Result Date: 06/05/2018 CLINICAL DATA:  Chest pain, fall with right hip fracture. EXAM: PORTABLE CHEST 1 VIEW COMPARISON:  05/04/2018 FINDINGS: The cardiomediastinal contours are normal. Post TAVR. Pulmonary vasculature is normal. No consolidation, pleural effusion, or pneumothorax. No acute osseous abnormalities are seen. IMPRESSION: No acute chest  findings. Electronically Signed   By: Keith Rake M.D.   On: 06/05/2018 03:42   Dg Hip Unilat W Or Wo Pelvis 2-3 Views Right  Result Date: 06/05/2018 CLINICAL DATA:  Slip and fall landing on hardwood floor today, right hip pain radiating to the knee. EXAM: DG HIP (WITH OR WITHOUT PELVIS) 2-3V RIGHT COMPARISON:  None. FINDINGS: Mildly displaced transcervical fracture of the right femoral neck. Femoral head remains seated. No significant angulation. No additional fracture. Pubic rami are intact. Pubic symphysis and sacroiliac joints are congruent. IMPRESSION: Mildly displaced transcervical right femoral neck fracture. Electronically Signed   By: Keith Rake M.D.   On: 06/05/2018 02:58   Dg Femur Min 2 Views Right  Result Date: 06/05/2018 CLINICAL DATA:  Slip and fall landing on hardwood floor today, right hip pain radiating to the knee. EXAM: RIGHT FEMUR 2 VIEWS COMPARISON:  None. FINDINGS: Mildly displaced transcervical femoral neck fracture. The distal femur is intact. No additional fracture. Degenerative change of  the knee, better characterized on concurrent knee radiographs. IMPRESSION: Mildly displaced transcervical femoral neck fracture. Distal femur is intact. Electronically Signed   By: Keith Rake M.D.   On: 06/05/2018 02:59   Dg Knee 3 Views Right  Result Date: 06/01/2018 Clinical:  Pain right knee for two weeks, no trauma X-rays were done of the right knee, three views. 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  There is severe changes of the medial compartment with bone on bone narrowing, small osteophyte formation.  No fracture is noted.  Bone quality is good. 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 Electronically Signed Sanjuana Kava, MD 12/31/20199:01 AM    Assessment/Plan:    #1 history of right hip fracture with ORIF he does have a history of metastatic disease per PET scan with  diffuse progression.  He has been started on oxycodone routinely 10 mg twice daily this appears to be helping he also has OxyIR 5 mg every 4 hours as needed which he seldom uses.  He is on Lovenox for DVT prophylaxis and continues on Robaxin as needed.  2.  History of Merkel cell carcinoma with metastasis- PET scan did show diffuse metastasis he will have follow-up by oncology and actually has chemo scheduled to start later this week.  3.  History of type 2 diabetes is on Glucotrol as well as Lantus and Jardiance as noted above this appears relatively well controlled CBGs are largely in the 100s with occasional but not frequent spikes above 200.  4.-  History of hypertension again metoprolol has been decreased secondary hypotension concerns blood pressure appears to be fairly stabilized I see systolics listed in the 09W yesterday but this does not appear to be the norm I got 110/56 today which appears to be more baseline.  Pulse rate appears to be controlled as well.  5.  History of diplopia he has been evaluated by ophthalmology at [facility] scan at the hospital apparently did not show any acute process- this will have follow-up apparently at [facility].  6.-  History of anemia-appears hemoglobin on most recent lab dropped somewhat down to 11.4.  Baseline and been more around 13--14 previously-I suspect there could be a chronic disease element here but will have this updated before discharge and this will be followed by oncology as well  \#7 history of status post TAVR- Plavix has been stopped per orders to again per cardiology on January 24 continues on aspirin 81 mg a day again he is on Lovenox as well.  8.  History of nausea he continues with Zofran as needed apparently he has been receiving this with relief in the facility.  9.  History of GERD he continues on Prilosec.  CPT- 99316-of note greater than 30 minutes spent on this discharge summary- greater than 50% of time spent  coordinating a plan of care for numerous diagnoses  Addendum we have obtain the updated labs which show stability white count is 7.1 hemoglobin is 11.1--2 weeks ago was 11.4.  Platelet count is 439,000.  Sodium is slightly low at 132 potassium 4.4 BUN of 27 creatinine 1.23 which is up slightly from previous baseline which was around 1  Again he will be going to oncology for chemo later this week I suspect they will do updated labs as well.  We will give a limited supply of the narcotics with no refills- he will have follow-up by oncology as well as primary  care provider

## 2018-06-29 ENCOUNTER — Telehealth: Payer: Self-pay | Admitting: Family Medicine

## 2018-06-29 NOTE — Telephone Encounter (Signed)
Please advise. Thank you

## 2018-06-29 NOTE — Telephone Encounter (Signed)
pts granddaughter Erline Levine) (on [place]) calling requesting a hospital bed for pt. Pt was discharged from nursing home yesterday and they didn't feel he needed a hospital bed. He is unable to get comfortable in bed and has trouble getting in and out of bed. Could one please be ordered through [place], [place]

## 2018-06-29 NOTE — Telephone Encounter (Signed)
Requesting verbal order for: Skilled nursing 1 time a week for 5 weeks-wound care  Regular on Asprin and Plavix and currently on Lovenox injections until end of Feburary, [place] would like to know does Dr.Scott recommend holding Asprin or Plavix until finsihed with Lovenox. Advise.

## 2018-06-29 NOTE — Telephone Encounter (Signed)
Rx written awaiting signature. 

## 2018-06-29 NOTE — Telephone Encounter (Signed)
Let us move forward with ordering hospital bed due to metastatic cancer and recent leg surgery [facility]

## 2018-06-29 NOTE — Telephone Encounter (Signed)
Script faxed to [facility] and [name] notified.

## 2018-07-01 NOTE — Telephone Encounter (Signed)
Left message to return call 

## 2018-07-01 NOTE — Telephone Encounter (Signed)
Jesse Cordova with [facility] is aware of all.

## 2018-07-01 NOTE — Telephone Encounter (Signed)
May have physical therapy order  While patient is on Lovenox hold aspirin  Notify us if any problems

## 2018-07-05 ENCOUNTER — Telehealth: Payer: Self-pay | Admitting: *Deleted

## 2018-07-05 NOTE — Telephone Encounter (Signed)
That would be fine to go ahead and give verbal order regarding that

## 2018-07-05 NOTE — Telephone Encounter (Signed)
[name] styons from [facility] [phone] calling to get verbal orders for physical therapy and also to get a verbal order to have staples removed. He saw ortho at [place] last Tuesday and had staples taken out and when he returned home family noticed that the second incision was forgot and they did not remove those staples. [facility] would like verbal order to remove staples also.

## 2018-07-05 NOTE — Telephone Encounter (Signed)
Verbal order given to Stacy at [facility].

## 2018-07-06 ENCOUNTER — Ambulatory Visit: Payer: Medicare Other | Admitting: Family Medicine

## 2018-07-06 ENCOUNTER — Telehealth: Payer: Self-pay | Admitting: Family Medicine

## 2018-07-06 DIAGNOSIS — E119 Type 2 diabetes mellitus without complications: Secondary | ICD-10-CM

## 2018-07-06 DIAGNOSIS — E7849 Other hyperlipidemia: Secondary | ICD-10-CM

## 2018-07-06 DIAGNOSIS — N182 Chronic kidney disease, stage 2 (mild): Secondary | ICD-10-CM

## 2018-07-06 DIAGNOSIS — C7B1 Secondary Merkel cell carcinoma: Secondary | ICD-10-CM

## 2018-07-06 DIAGNOSIS — R54 Age-related physical debility: Secondary | ICD-10-CM | POA: Diagnosis not present

## 2018-07-06 DIAGNOSIS — R634 Abnormal weight loss: Secondary | ICD-10-CM

## 2018-07-06 DIAGNOSIS — C4A30 Merkel cell carcinoma of unspecified part of face: Secondary | ICD-10-CM

## 2018-07-06 DIAGNOSIS — Z794 Long term (current) use of insulin: Secondary | ICD-10-CM

## 2018-07-06 DIAGNOSIS — E1122 Type 2 diabetes mellitus with diabetic chronic kidney disease: Secondary | ICD-10-CM | POA: Insufficient documentation

## 2018-07-06 MED ORDER — OXYCODONE HCL 10 MG PO TABS: ORAL_TABLET | ORAL | 0 refills | Status: AC

## 2018-07-06 NOTE — Telephone Encounter (Signed)
Nurse(Cat) with [facility] needing verbal orders for 2 times a week for 2 weeks with physical therapy with patient. [phone]

## 2018-07-06 NOTE — Telephone Encounter (Signed)
Verbal orders given to Cat with [facility].

## 2018-07-06 NOTE — Progress Notes (Signed)
Subjective:     Patient ID: Jesse Cordova, male   DOB: [birth year], 78 y.o.   MRN: [id number]  HPI This patient presents today for follow-up regarding leg surgery He had a fracture due to a pathologic fracture.  It is been necessary for this patient to get surgery at [facility] they are treating him for metastatic cancer.  In addition to this patient is having significant amount of pain having to take oxycodone 10 mg every 4-6 hours as needed for pain.  Does take stool softener and MiraLAX to help keep bowels soft and regular denies black or bloody stools  Appetite has been poor his oncologist is working at getting a medication approved for him to help his appetite  Patient denies fever sweats chills shortness of breath wheezing difficulty breathing.  Patient has been losing weight so therefore may need to adjust medicine he is due for some blood work for diabetes.  Also his specialist would like to have additional lab work done for him.  Review of Systems  Constitutional: Negative for activity change.  HENT: Negative for congestion and rhinorrhea.   Respiratory: Negative for cough and shortness of breath.   Cardiovascular: Negative for chest pain.  Gastrointestinal: Negative for abdominal pain, diarrhea, nausea and vomiting.  Genitourinary: Negative for dysuria and hematuria.  Musculoskeletal: Positive for arthralgias and back pain.  Neurological: Negative for weakness and headaches.  Psychiatric/Behavioral: Negative for behavioral problems and confusion.       Objective:   Physical Exam Vitals signs reviewed.  Constitutional:      General: He is not in acute distress. HENT:     Head: Normocephalic and atraumatic.  Eyes:     General:        Right eye: No discharge.        Left eye: No discharge.  Neck:     Trachea: No tracheal deviation.  Cardiovascular:     Rate and Rhythm: Normal rate and regular rhythm.     Heart sounds: Normal heart sounds. No murmur.  Pulmonary:     Effort:  Pulmonary effort is normal. No respiratory distress.     Breath sounds: Normal breath sounds.  Lymphadenopathy:     Cervical: No cervical adenopathy.  Skin:    General: Skin is warm and dry.  Neurological:     Mental Status: He is alert.     Coordination: Coordination normal.  Psychiatric:        Behavior: Behavior normal.        Assessment:     See diagnosis    Plan:     1. Merkel cell carcinoma of face [facility]) Patient being treated by [place]   2. 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 Frailty Oncologist try to get him a appetite stimulant  4. Abnormal weight loss Weight loss related to his cancer and encourage supplements  5. Type 2 diabetes mellitus without complication, with long-term current use of insulin (HCC) Overall seems to be doing well check lab work  6. Other hyperlipidemia Check lab work watch diet stay active as possible  7. CKD stage 2 due to type 2 diabetes mellitus ([place]) Will check lab work  Chronic pain related to the cancer recommend oxycodone 10 mg every 4-6 hours caution drowsiness family will monitor use  Lab work will be ordered  Face-to-face evaluation was done on July 06, 2018.  Patient has a healing metastatic fracture of the leg.  Patient is  very debilitated and frail.  He is unable to get comfortable in a standard bed.  It is necessary to use a hospital bed to get proper positioning.  Patient needs to change positions frequently and cannot do so with a standard bed.  Patient also at times has to change the height of the bed for safety measures.  Hospital bed is medically indicated and will be necessary to use probably lifetime.  His overall prognosis with metastatic cancer unfortunately is poor but we hope for the best  We will reduce some of his medications.

## 2018-07-06 NOTE — Telephone Encounter (Signed)
Please go ahead with this order thank you

## 2018-07-08 ENCOUNTER — Ambulatory Visit: Payer: Medicare Other | Admitting: Family Medicine

## 2018-07-08 ENCOUNTER — Encounter: Payer: Self-pay | Admitting: *Deleted

## 2018-07-08 DIAGNOSIS — I10 Essential (primary) hypertension: Secondary | ICD-10-CM

## 2018-07-08 DIAGNOSIS — M1991 Primary osteoarthritis, unspecified site: Secondary | ICD-10-CM

## 2018-07-08 DIAGNOSIS — W01198D Fall on same level from slipping, tripping and stumbling with subsequent striking against other object, subsequent encounter: Secondary | ICD-10-CM

## 2018-07-08 DIAGNOSIS — S72101D Unspecified trochanteric fracture of right femur, subsequent encounter for closed fracture with routine healing: Secondary | ICD-10-CM

## 2018-07-08 DIAGNOSIS — E119 Type 2 diabetes mellitus without complications: Secondary | ICD-10-CM | POA: Diagnosis not present

## 2018-07-08 NOTE — Progress Notes (Signed)
Message sent via my chart

## 2018-07-08 NOTE — Progress Notes (Signed)
Orders given to [facility] at [facility] -labs will be drawn 07/14/18

## 2018-07-13 ENCOUNTER — Other Ambulatory Visit: Payer: Self-pay | Admitting: Family Medicine

## 2018-07-14 ENCOUNTER — Encounter: Payer: Self-pay | Admitting: Family Medicine

## 2018-07-15 ENCOUNTER — Telehealth: Payer: Self-pay | Admitting: Family Medicine

## 2018-07-15 ENCOUNTER — Encounter (HOSPITAL_COMMUNITY): Payer: Self-pay | Admitting: *Deleted

## 2018-07-15 ENCOUNTER — Emergency Department (HOSPITAL_COMMUNITY)
Admission: EM | Admit: 2018-07-15 | Discharge: 2018-07-15 | Disposition: A | Discharge status: Home / Self Care | Payer: Medicare Other | Attending: Emergency Medicine | Admitting: Emergency Medicine

## 2018-07-15 ENCOUNTER — Other Ambulatory Visit: Payer: Self-pay

## 2018-07-15 ENCOUNTER — Encounter: Payer: Self-pay | Admitting: Family Medicine

## 2018-07-15 DIAGNOSIS — I129 Hypertensive chronic kidney disease with stage 1 through stage 4 chronic kidney disease, or unspecified chronic kidney disease: Secondary | ICD-10-CM | POA: Diagnosis not present

## 2018-07-15 DIAGNOSIS — I2510 Atherosclerotic heart disease of native coronary artery without angina pectoris: Secondary | ICD-10-CM | POA: Insufficient documentation

## 2018-07-15 DIAGNOSIS — E119 Type 2 diabetes mellitus without complications: Secondary | ICD-10-CM | POA: Diagnosis not present

## 2018-07-15 DIAGNOSIS — Z7982 Long term (current) use of aspirin: Secondary | ICD-10-CM | POA: Insufficient documentation

## 2018-07-15 DIAGNOSIS — Z79899 Other long term (current) drug therapy: Secondary | ICD-10-CM | POA: Insufficient documentation

## 2018-07-15 DIAGNOSIS — E861 Hypovolemia: Secondary | ICD-10-CM | POA: Diagnosis not present

## 2018-07-15 DIAGNOSIS — E860 Dehydration: Secondary | ICD-10-CM | POA: Diagnosis not present

## 2018-07-15 DIAGNOSIS — Z794 Long term (current) use of insulin: Secondary | ICD-10-CM | POA: Diagnosis not present

## 2018-07-15 DIAGNOSIS — I959 Hypotension, unspecified: Secondary | ICD-10-CM | POA: Diagnosis present

## 2018-07-15 DIAGNOSIS — N182 Chronic kidney disease, stage 2 (mild): Secondary | ICD-10-CM | POA: Diagnosis not present

## 2018-07-15 DIAGNOSIS — I9589 Other hypotension: Secondary | ICD-10-CM

## 2018-07-15 HISTORY — DX: Other specified postprocedural states: Z98.890

## 2018-07-15 LAB — CBC WITH DIFFERENTIAL/PLATELET
Abs Immature Granulocytes: 0 10*3/uL (ref 0.00–0.07)
BASOS PCT: 1 %
Basophils Absolute: 0 10*3/uL (ref 0.0–0.1)
EOS ABS: 0 10*3/uL (ref 0.0–0.5)
Eosinophils Relative: 2 %
HCT: 32.9 % — ABNORMAL LOW (ref 39.0–52.0)
Hemoglobin: 10.1 g/dL — ABNORMAL LOW (ref 13.0–17.0)
Immature Granulocytes: 0 %
Lymphocytes Relative: 57 %
Lymphs Abs: 1.1 10*3/uL (ref 0.7–4.0)
MCH: 30.1 pg (ref 26.0–34.0)
MCHC: 30.7 g/dL (ref 30.0–36.0)
MCV: 97.9 fL (ref 80.0–100.0)
Monocytes Absolute: 0.2 10*3/uL (ref 0.1–1.0)
Monocytes Relative: 12 %
Neutro Abs: 0.5 10*3/uL — ABNORMAL LOW (ref 1.7–7.7)
Neutrophils Relative %: 28 %
Platelets: 235 10*3/uL (ref 150–400)
RBC: 3.36 MIL/uL — ABNORMAL LOW (ref 4.22–5.81)
RDW: 14.9 % (ref 11.5–15.5)
WBC: 1.9 10*3/uL — ABNORMAL LOW (ref 4.0–10.5)
nRBC: 0 % (ref 0.0–0.2)

## 2018-07-15 LAB — BASIC METABOLIC PANEL
Anion gap: 9 (ref 5–15)
BUN: 21 mg/dL (ref 8–23)
CO2: 25 mmol/L (ref 22–32)
Calcium: 8.6 mg/dL — ABNORMAL LOW (ref 8.9–10.3)
Chloride: 102 mmol/L (ref 98–111)
Creatinine, Ser: 1.38 mg/dL — ABNORMAL HIGH (ref 0.61–1.24)
GFR calc Af Amer: 56 mL/min — ABNORMAL LOW (ref >60)
GFR, EST NON AFRICAN AMERICAN: 49 mL/min — AB (ref >60)
Glucose, Bld: 220 mg/dL — ABNORMAL HIGH (ref 70–99)
Potassium: 4 mmol/L (ref 3.5–5.1)
Sodium: 136 mmol/L (ref 135–145)

## 2018-07-15 LAB — CBG MONITORING, ED: GLUCOSE-CAPILLARY: 190 mg/dL — AB (ref 70–99)

## 2018-07-15 MED ORDER — SODIUM CHLORIDE 0.9 % IV BOLUS
500.0000 mL | Freq: Once | INTRAVENOUS | Status: AC
  Administered 2018-07-15: 500 mL via INTRAVENOUS

## 2018-07-15 NOTE — ED Triage Notes (Signed)
Patient with home health, initially hypotensive, felt lightheaded when stood up this morning. Denies LOC or fall.

## 2018-07-15 NOTE — Telephone Encounter (Signed)
[place] daughter contacted office to inform us that patient blood pressure is 80/50. [name] states that he could not get comfortable last night, pain in knee is "killing him". Pt has Oxy 10 at 6:30 pm and then another one at 7:30pm. Pt had oxy 10 at and one Tylenol 500 mg this morning and 8:30 am. Pt ate breakfast and is eating. No fever no shortness of breath, no chest pain. Per Dr.Scott; recheck BP if still low go to ER. If BP comes up, keep check at home. [place] daughter had Occupational Therapist check BP again and it was 90/50 but pt began to feel lightheaded. [place] daughter informed to take patient to ER for evaluation. [place] daughter states he does not feel like going. Informed grand daughter that he may be dehydrated. [place] daughter states that she will call EMS or home health nurse to get them to come out and check patient.

## 2018-07-15 NOTE — Discharge Instructions (Addendum)
Please make sure to stay well hydrated at home.  Stop metoprolol for now and please follow up with your primary care doctor for further instructions.

## 2018-07-15 NOTE — ED Provider Notes (Signed)
[facility] EMERGENCY DEPARTMENT Provider Note   CSN: [id number] Arrival date & time: 07/15/18  1029     History   Chief Complaint Chief Complaint  Patient presents with  . Near Syncope    HPI Jesse Cordova is a 78 y.o. male with PMH significant for recent R femoral neck fracture s/p CMN in January, merkel cell carcinoma on chemo, DM, HTN, HLD, h/o TAVR here with hypotension. Daughter is historian and also niece who is POA was available by phone.  Patient was at home this morning working with home OT, daughter was present, when he was noted to be sleepy and lightheaded. He was sitting down at the time, his BP was checked and noted to be low. His PCP was called and recommended ER evaluation for hypotension. Daughter states he did not lose LOC or having any seizure like activity. He denies chest pain or palpitations or SOB or vision changes at that time or now. She states that he was always able to answer questions appropriately through this episode and was mentating well. He was recently placed back on his home metoprolol in the setting of elevated BPs from pain in his R hip. Prior to that he had been taken off due to lower BPs. He has also been taking pain medications for cancer related pain that daughter thinks makes him quite drowsy. Other medication change was that he was recently on lovenox, last dose on 07/11/18 He eats and drinks very little but has been doing relatively well on po intake recently until last night. Last night he had a very difficulty time with pain. Of note, his wife died last week and he is under a lot of stress.       Past Medical History:  Diagnosis Date  . 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 Arthritis   . Cancer ([place])    skin cancer  . Chronic low back pain   . Complication of anesthesia   . Diabetes mellitus   . GERD (gastroesophageal reflux disease)   . History of hip surgery 06/02/2018   right hip, right knee  . Hyperlipemia   . Hypertension   .  Merkel cell carcinoma of face ([place]) 02/18/2016    Followed by [facility], underwent surgery now undergoing radiation September 2017  . PONV (postoperative nausea and vomiting)   . 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 CKD stage 2 due to type 2 diabetes mellitus ([place]) 07/06/2018  . Fracture of femoral neck, right, closed ([place]) 06/05/2018  . GERD (gastroesophageal reflux disease) 06/05/2018  . CAD (coronary artery disease) 06/05/2018  . Chest pain 06/05/2018  . 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 Severe aortic stenosis 12/22/2017  . Merkel cell carcinoma of face ([place]) 02/18/2016  . Essential hypertension, benign 11/15/2012  . Hyperlipidemia 11/15/2012  . Controlled diabetes mellitus type 2 with complications ([place]) 33/29/5188  . Adrenal nodule ([place]) 11/10/2011    Past Surgical History:  Procedure Laterality Date  . BIOPSY  01/16/2012   Procedure: BIOPSY;  Surgeon: Rogene Houston, MD;  Location: [facility] ENDO SUITE;  Service: Endoscopy;  Laterality: N/A;  . CATARACT EXTRACTION Right 7/06  . CATARACT EXTRACTION W/PHACO Left 01/05/2014   Procedure: CATARACT EXTRACTION PHACO AND INTRAOCULAR LENS PLACEMENT (IOC);  Surgeon: Tonny Branch, MD;  Location: [facility]  ORS;  Service: Ophthalmology;  Laterality: Left;  CDE:15.98  . COLONOSCOPY  01/29/12   abnormal repeat in one year  . ESOPHAGOGASTRODUODENOSCOPY    . KNEE ARTHROSCOPY     2 right knee surgeries and 1 left knee surgery  . RIGHT/LEFT HEART CATH AND CORONARY ANGIOGRAPHY N/A 11/25/2017   Procedure: RIGHT/LEFT HEART CATH AND CORONARY ANGIOGRAPHY;  Surgeon: Burnell Blanks, MD;  Location: [place] CV LAB;  Service: Cardiovascular;  Laterality: N/A;  . TEE WITHOUT CARDIOVERSION N/A 12/22/2017   Procedure: TRANSESOPHAGEAL ECHOCARDIOGRAM  (TEE);  Surgeon: Burnell Blanks, MD;  Location: [place];  Service: Open Heart Surgery;  Laterality: N/A;  . TONSILLECTOMY    . TRANSCATHETER AORTIC VALVE REPLACEMENT, TRANSFEMORAL N/A 12/22/2017   Procedure: TRANSCATHETER AORTIC VALVE REPLACEMENT, TRANSFEMORAL;  Surgeon: Burnell Blanks, MD;  Location: [place];  Service: Open Heart Surgery;  Laterality: N/A;  . VASECTOMY          Home Medications    Prior to Admission medications   Medication Sig Start Date End Date Taking? Authorizing Provider  acetaminophen (TYLENOL) 500 MG tablet Take 1,000 mg by mouth 3 (three) times daily.    [provider]  aspirin EC 81 MG tablet Take 81 mg by mouth daily.    [provider]  benzonatate (TESSALON) 200 MG capsule Take 200 mg by mouth 3 (three) times daily as needed for cough.    [provider]  diphenoxylate-atropine (LOMOTIL) 2.5-0.025 MG tablet Take 1 tablet by mouth 4 (four) times daily as needed for diarrhea or loose stools.    [provider]  empagliflozin (JARDIANCE) 25 MG TABS tablet Take 25 mg by mouth daily.    [provider]  enoxaparin (LOVENOX) 40 MG/0.4ML injection Inject 40 mg into the skin daily.    [provider]  ergocalciferol (VITAMIN D2) 1.25 MG (50000 UT) capsule Take 50,000 Units by mouth once a week.    [provider]  glipiZIDE (GLUCOTROL) 5 MG tablet Take 2.5 mg by mouth 2 (two) times daily before a meal.     [provider]  insulin glargine (LANTUS) 100 UNIT/ML injection Inject 10 Units into the skin at bedtime.    [provider]  lovastatin (MEVACOR) 20 MG tablet Take 20 mg by mouth every evening.    [provider]  methocarbamol (ROBAXIN) 500 MG tablet Take 500 mg by mouth 3 (three) times daily as needed for muscle spasms.    [provider]  metoprolol succinate (TOPROL-XL) 25 MG 24 hr tablet TAKE 1 TABLET BY MOUTH ONCE A DAY. 06/29/18   [name],  MD  omeprazole (PRILOSEC) 20 MG capsule Take 20 mg by mouth daily.    [provider]  Oxycodone HCl 10 MG TABS 1 every 4 hours as needed severe pain caution drowsiness metastatic cancer 07/06/18   [name], MD  polyethylene glycol (MIRALAX / GLYCOLAX) packet Take 17 g by mouth 2 (two) times daily.    [provider]  senna (SENOKOT) 8.6 MG tablet Take 2 tablets by mouth at bedtime as needed for constipation.     [provider]    Family History Family History  Problem Relation Age of Onset  . Diabetes Sister   . Heart disease Sister   . Atrial fibrillation Sister   . Heart disease Other   . Diabetes Other   . Depression Maternal Grandfather     Social History Social History   Tobacco Use  .  Smoking status: Never Smoker  . Smokeless tobacco: Never Used  . Tobacco comment: From spouse. Wife does not smoke around him now  Substance Use Topics  . Alcohol use: No    Alcohol/week: 0.0 standard drinks  . Drug use: No     Allergies   Cortisone; Tetanus toxoids; and Hydrocodone-homatropine   Review of Systems Review of Systems  Constitutional: Negative for chills and fever.  HENT: Negative for congestion, rhinorrhea and sore throat.   Respiratory: Negative for cough and shortness of breath.   Cardiovascular: Negative for chest pain and palpitations.  Gastrointestinal: Negative for abdominal pain, blood in stool, diarrhea, nausea and vomiting.  Genitourinary: Negative for difficulty urinating and dysuria.  Musculoskeletal:       R hip pain  Neurological: Positive for weakness and light-headedness. Negative for tremors, seizures and speech difficulty.  Psychiatric/Behavioral: Negative for confusion.     Physical Exam Updated Vital Signs BP (!) 96/49   Pulse 90   Temp 98 F (36.7 C) (Oral)   Resp 12   Ht 6\' 1"  (1.854 m)   Wt 71.7 kg   SpO2 96%   BMI 20.85 kg/m   Physical Exam Constitutional:      General: He is not in acute  distress.    Appearance: He is not ill-appearing, toxic-appearing or diaphoretic.     Comments: Frail appearing. Drowsy but easily arousable to voice.  HENT:     Head: Normocephalic and atraumatic.     Nose: Nose normal.     Mouth/Throat:     Mouth: Mucous membranes are dry.     Pharynx: Oropharynx is clear. No oropharyngeal exudate or posterior oropharyngeal erythema.  Eyes:     Extraocular Movements: Extraocular movements intact.     Conjunctiva/sclera: Conjunctivae normal.     Pupils: Pupils are equal, round, and reactive to light.  Neck:     Musculoskeletal: Normal range of motion.  Cardiovascular:     Rate and Rhythm: Normal rate and regular rhythm.     Heart sounds: Murmur (systolic grade III) present.  Pulmonary:     Effort: Pulmonary effort is normal.     Breath sounds: Normal breath sounds. No wheezing or rhonchi.  Abdominal:     General: Abdomen is flat. Bowel sounds are normal.     Tenderness: There is no abdominal tenderness. There is no guarding or rebound.  Musculoskeletal:     Right lower leg: No edema.     Left lower leg: No edema.  Skin:    General: Skin is warm and dry.     Coloration: Skin is not pale.  Neurological:     General: No focal deficit present.  Psychiatric:        Mood and Affect: Mood normal.      ED Treatments / Results  Labs (all labs ordered are listed, but only abnormal results are displayed) Labs Reviewed  CBC WITH DIFFERENTIAL/PLATELET - Abnormal; Notable for the following components:      Result Value   WBC 1.9 (*)    RBC 3.36 (*)    Hemoglobin 10.1 (*)    HCT 32.9 (*)    Neutro Abs 0.5 (*)    All other components within normal limits  BASIC METABOLIC PANEL - Abnormal; Notable for the following components:   Glucose, Bld 220 (*)    Creatinine, Ser 1.38 (*)    Calcium 8.6 (*)    GFR calc non Af Amer 49 (*)    GFR calc Af  Amer 56 (*)    All other components within normal limits  CBG MONITORING, ED - Abnormal; Notable for  the following components:   Glucose-Capillary 190 (*)    All other components within normal limits    EKG EKG Interpretation  Date/Time:  Thursday July 15 2018 10:42:10 EST Ventricular Rate:  89 PR Interval:    QRS Duration: 90 QT Interval:  377 QTC Calculation: 459 R Axis:   -8 Text Interpretation:  Sinus rhythm Left ventricular hypertrophy Borderline T abnormalities, inferior leads Confirmed by Elnora Morrison [phone]) on 07/15/2018 11:34:09 AM   Radiology No results found.  Procedures Procedures (including critical care time)  Medications Ordered in ED Medications  sodium chloride 0.9 % bolus 500 mL (500 mLs Intravenous New Bag/Given 07/15/18 1230)     Initial Impression / Assessment and Plan / ED Course  I have reviewed the triage vital signs and the nursing notes.  Pertinent labs & imaging results that were available during my care of the patient were reviewed by me and considered in my medical decision making (see chart for details).    Patient with lightheadedness and found to be hypotensive at home. BP on arrival 98/51 but afebrile and mentating well. Likely due to dehydration as appeared dry on exam which may have been exacerbated by recent restart of metoprolol as well as the chronic pain medications for patient's recent metastatic cancer diagnosis. Hgb stable at 10.1 though slightly decreased from baseline 11 which may be due to being on lovenox recently but no acute bleeding and patient is no longer on lovenox treatments.  BP improved with 500cc bolus to 112/59, patient is more alert and interactive. Feels well with stable vital signs. Stable for DC home. Recommended hoping metoprolol on DC, family and patient voiced good understanding.  Final Clinical Impressions(s) / ED Diagnoses   Final diagnoses:  Dehydration  Hypotension due to hypovolemia    ED Discharge Orders    None       Bufford Lope, DO 07/15/18 1353    Elnora Morrison, MD 07/15/18  1614

## 2018-07-15 NOTE — Telephone Encounter (Signed)
I do not recommend muscle relaxers for a patient this age-refuse please

## 2018-07-15 NOTE — Telephone Encounter (Signed)
Erica Please fax most recent lab work for Jesse Cordova-please do this Friday morning first thing thank you CBC, met 7 Fax this to [facility] attention [place] nurse navigator Fax number [phone] Thank you

## 2018-07-16 ENCOUNTER — Telehealth: Payer: Self-pay | Admitting: *Deleted

## 2018-07-16 NOTE — Telephone Encounter (Signed)
Faxed over to Nurse Nevin Bloodgood this morning.

## 2018-07-16 NOTE — Telephone Encounter (Signed)
Spoke with pt's granddaughterLaverda Cordova )re meds Pt was recently discharged from nursing home and pt was taking Metoprolol and Lisinopril Granddaughter thought Metoprolol was discontinued when pt seen Jesse Leitz PA back in August after reviewing office note pt was to stop Lisinopril and continue Metoprolol Per granddaughter pt was recently in ED for hypotension will resume meds as directed ./cy

## 2018-07-16 NOTE — Telephone Encounter (Signed)
I did review over the ER records We will do regular follow-ups with this patient

## 2018-07-17 ENCOUNTER — Telehealth: Payer: Self-pay | Admitting: Family Medicine

## 2018-07-20 ENCOUNTER — Inpatient Hospital Stay (HOSPITAL_COMMUNITY)
Admission: EM | Admit: 2018-07-20 | Discharge: 2018-07-23 | DRG: 864 | Disposition: A | Discharge status: Home Health | Payer: Medicare Other | Attending: Family Medicine | Admitting: Family Medicine

## 2018-07-20 ENCOUNTER — Emergency Department (HOSPITAL_COMMUNITY): Payer: Medicare Other

## 2018-07-20 ENCOUNTER — Encounter (HOSPITAL_COMMUNITY): Payer: Self-pay

## 2018-07-20 ENCOUNTER — Other Ambulatory Visit: Payer: Self-pay

## 2018-07-20 DIAGNOSIS — C4A30 Merkel cell carcinoma of unspecified part of face: Secondary | ICD-10-CM | POA: Diagnosis present

## 2018-07-20 DIAGNOSIS — Z961 Presence of intraocular lens: Secondary | ICD-10-CM | POA: Diagnosis present

## 2018-07-20 DIAGNOSIS — A419 Sepsis, unspecified organism: Secondary | ICD-10-CM

## 2018-07-20 DIAGNOSIS — Z887 Allergy status to serum and vaccine status: Secondary | ICD-10-CM | POA: Diagnosis not present

## 2018-07-20 DIAGNOSIS — E785 Hyperlipidemia, unspecified: Secondary | ICD-10-CM | POA: Diagnosis present

## 2018-07-20 DIAGNOSIS — M1990 Unspecified osteoarthritis, unspecified site: Secondary | ICD-10-CM | POA: Diagnosis present

## 2018-07-20 DIAGNOSIS — Z885 Allergy status to narcotic agent status: Secondary | ICD-10-CM | POA: Diagnosis not present

## 2018-07-20 DIAGNOSIS — K219 Gastro-esophageal reflux disease without esophagitis: Secondary | ICD-10-CM | POA: Diagnosis present

## 2018-07-20 DIAGNOSIS — R6510 Systemic inflammatory response syndrome (SIRS) of non-infectious origin without acute organ dysfunction: Secondary | ICD-10-CM | POA: Diagnosis present

## 2018-07-20 DIAGNOSIS — Z9841 Cataract extraction status, right eye: Secondary | ICD-10-CM | POA: Diagnosis not present

## 2018-07-20 DIAGNOSIS — I2510 Atherosclerotic heart disease of native coronary artery without angina pectoris: Secondary | ICD-10-CM | POA: Diagnosis present

## 2018-07-20 DIAGNOSIS — Z953 Presence of xenogenic heart valve: Secondary | ICD-10-CM | POA: Diagnosis not present

## 2018-07-20 DIAGNOSIS — E871 Hypo-osmolality and hyponatremia: Secondary | ICD-10-CM | POA: Diagnosis present

## 2018-07-20 DIAGNOSIS — Z79891 Long term (current) use of opiate analgesic: Secondary | ICD-10-CM

## 2018-07-20 DIAGNOSIS — Z7901 Long term (current) use of anticoagulants: Secondary | ICD-10-CM

## 2018-07-20 DIAGNOSIS — Z794 Long term (current) use of insulin: Secondary | ICD-10-CM

## 2018-07-20 DIAGNOSIS — Z923 Personal history of irradiation: Secondary | ICD-10-CM | POA: Diagnosis not present

## 2018-07-20 DIAGNOSIS — D72819 Decreased white blood cell count, unspecified: Secondary | ICD-10-CM | POA: Diagnosis present

## 2018-07-20 DIAGNOSIS — Z818 Family history of other mental and behavioral disorders: Secondary | ICD-10-CM | POA: Diagnosis not present

## 2018-07-20 DIAGNOSIS — Z833 Family history of diabetes mellitus: Secondary | ICD-10-CM | POA: Diagnosis not present

## 2018-07-20 DIAGNOSIS — Z8249 Family history of ischemic heart disease and other diseases of the circulatory system: Secondary | ICD-10-CM | POA: Diagnosis not present

## 2018-07-20 DIAGNOSIS — E119 Type 2 diabetes mellitus without complications: Secondary | ICD-10-CM | POA: Diagnosis present

## 2018-07-20 DIAGNOSIS — Z888 Allergy status to other drugs, medicaments and biological substances status: Secondary | ICD-10-CM

## 2018-07-20 DIAGNOSIS — Z9842 Cataract extraction status, left eye: Secondary | ICD-10-CM

## 2018-07-20 DIAGNOSIS — R509 Fever, unspecified: Secondary | ICD-10-CM

## 2018-07-20 DIAGNOSIS — Z79899 Other long term (current) drug therapy: Secondary | ICD-10-CM

## 2018-07-20 DIAGNOSIS — Z9221 Personal history of antineoplastic chemotherapy: Secondary | ICD-10-CM | POA: Diagnosis not present

## 2018-07-20 DIAGNOSIS — I10 Essential (primary) hypertension: Secondary | ICD-10-CM | POA: Diagnosis present

## 2018-07-20 DIAGNOSIS — M545 Low back pain: Secondary | ICD-10-CM | POA: Diagnosis present

## 2018-07-20 LAB — COMPREHENSIVE METABOLIC PANEL
ALT: 10 U/L (ref 0–44)
AST: 26 U/L (ref 15–41)
Albumin: 3.5 g/dL (ref 3.5–5.0)
Alkaline Phosphatase: 84 U/L (ref 38–126)
Anion gap: 12 (ref 5–15)
BUN: 17 mg/dL (ref 8–23)
CO2: 21 mmol/L — ABNORMAL LOW (ref 22–32)
Calcium: 8.8 mg/dL — ABNORMAL LOW (ref 8.9–10.3)
Chloride: 97 mmol/L — ABNORMAL LOW (ref 98–111)
Creatinine, Ser: 1.17 mg/dL (ref 0.61–1.24)
GFR calc Af Amer: >60 mL/min (ref >60)
GFR calc non Af Amer: 59 mL/min — ABNORMAL LOW (ref >60)
GLUCOSE: 148 mg/dL — AB (ref 70–99)
Potassium: 4.6 mmol/L (ref 3.5–5.1)
Sodium: 130 mmol/L — ABNORMAL LOW (ref 135–145)
Total Bilirubin: 0.7 mg/dL (ref 0.3–1.2)
Total Protein: 7.2 g/dL (ref 6.5–8.1)

## 2018-07-20 LAB — URINALYSIS, ROUTINE W REFLEX MICROSCOPIC
Bacteria, UA: NONE SEEN
Bilirubin Urine: NEGATIVE
Glucose, UA: >=500 mg/dL — AB
Hgb urine dipstick: NEGATIVE
KETONES UR: 20 mg/dL — AB
LEUKOCYTE UA: NEGATIVE
Nitrite: NEGATIVE
Protein, ur: NEGATIVE mg/dL
Specific Gravity, Urine: 1.021 (ref 1.005–1.030)
pH: 5 (ref 5.0–8.0)

## 2018-07-20 LAB — CBC WITH DIFFERENTIAL/PLATELET
Abs Immature Granulocytes: 0.02 10*3/uL (ref 0.00–0.07)
BASOS ABS: 0 10*3/uL (ref 0.0–0.1)
Basophils Relative: 1 %
Eosinophils Absolute: 0 10*3/uL (ref 0.0–0.5)
Eosinophils Relative: 0 %
HCT: 35.9 % — ABNORMAL LOW (ref 39.0–52.0)
Hemoglobin: 11.3 g/dL — ABNORMAL LOW (ref 13.0–17.0)
Immature Granulocytes: 1 %
LYMPHS ABS: 0.8 10*3/uL (ref 0.7–4.0)
Lymphocytes Relative: 25 %
MCH: 29.4 pg (ref 26.0–34.0)
MCHC: 31.5 g/dL (ref 30.0–36.0)
MCV: 93.5 fL (ref 80.0–100.0)
Monocytes Absolute: 0.8 10*3/uL (ref 0.1–1.0)
Monocytes Relative: 25 %
Neutro Abs: 1.7 10*3/uL (ref 1.7–7.7)
Neutrophils Relative %: 48 %
Platelets: 386 10*3/uL (ref 150–400)
RBC: 3.84 MIL/uL — ABNORMAL LOW (ref 4.22–5.81)
RDW: 15.1 % (ref 11.5–15.5)
WBC: 3.4 10*3/uL — ABNORMAL LOW (ref 4.0–10.5)
nRBC: 0 % (ref 0.0–0.2)

## 2018-07-20 LAB — PROTIME-INR
INR: 1.14
Prothrombin Time: 14.5 seconds (ref 11.4–15.2)

## 2018-07-20 LAB — GLUCOSE, CAPILLARY: Glucose-Capillary: 91 mg/dL (ref 70–99)

## 2018-07-20 LAB — INFLUENZA PANEL BY PCR (TYPE A & B)
INFLAPCR: NEGATIVE
Influenza B By PCR: NEGATIVE

## 2018-07-20 LAB — LACTIC ACID, PLASMA
LACTIC ACID, VENOUS: 1 mmol/L (ref 0.5–1.9)
Lactic Acid, Venous: 0.9 mmol/L (ref 0.5–1.9)

## 2018-07-20 MED ORDER — VANCOMYCIN HCL IN DEXTROSE 750-5 MG/150ML-% IV SOLN
750.0000 mg | Freq: Two times a day (BID) | INTRAVENOUS | Status: DC
  Administered 2018-07-21 – 2018-07-23 (×6): 750 mg via INTRAVENOUS
  Filled 2018-07-20 (×6): qty 150

## 2018-07-20 MED ORDER — PIPERACILLIN-TAZOBACTAM 3.375 G IVPB
3.3750 g | Freq: Three times a day (TID) | INTRAVENOUS | Status: DC
  Administered 2018-07-20 – 2018-07-23 (×9): 3.375 g via INTRAVENOUS
  Filled 2018-07-20 (×9): qty 50

## 2018-07-20 MED ORDER — SODIUM CHLORIDE 0.9 % IV BOLUS
2000.0000 mL | Freq: Once | INTRAVENOUS | Status: AC
  Administered 2018-07-20: 2000 mL via INTRAVENOUS

## 2018-07-20 MED ORDER — ENOXAPARIN SODIUM 40 MG/0.4ML ~~LOC~~ SOLN
40.0000 mg | SUBCUTANEOUS | Status: DC
  Administered 2018-07-20 – 2018-07-22 (×3): 40 mg via SUBCUTANEOUS
  Filled 2018-07-20 (×3): qty 0.4

## 2018-07-20 MED ORDER — VANCOMYCIN HCL IN DEXTROSE 1-5 GM/200ML-% IV SOLN
1000.0000 mg | Freq: Once | INTRAVENOUS | Status: AC
  Administered 2018-07-20: 1000 mg via INTRAVENOUS
  Filled 2018-07-20: qty 200

## 2018-07-20 MED ORDER — SODIUM CHLORIDE 0.9 % IV SOLN
INTRAVENOUS | Status: AC
  Administered 2018-07-20: 21:00:00 via INTRAVENOUS

## 2018-07-20 MED ORDER — ONDANSETRON HCL 4 MG PO TABS: 4.0000 mg | ORAL_TABLET | Freq: Four times a day (QID) | ORAL | Status: DC | PRN

## 2018-07-20 MED ORDER — ACETAMINOPHEN 650 MG RE SUPP: 650.0000 mg | Freq: Four times a day (QID) | RECTAL | Status: DC | PRN

## 2018-07-20 MED ORDER — PRAVASTATIN SODIUM 10 MG PO TABS
20.0000 mg | ORAL_TABLET | Freq: Every day | ORAL | Status: DC
  Administered 2018-07-20 – 2018-07-23 (×4): 20 mg via ORAL
  Filled 2018-07-20 (×4): qty 2

## 2018-07-20 MED ORDER — MORPHINE SULFATE ER 15 MG PO TBCR
15.0000 mg | EXTENDED_RELEASE_TABLET | Freq: Two times a day (BID) | ORAL | Status: DC
  Administered 2018-07-20 – 2018-07-23 (×6): 15 mg via ORAL
  Filled 2018-07-20 (×6): qty 1

## 2018-07-20 MED ORDER — PIPERACILLIN-TAZOBACTAM 3.375 G IVPB 30 MIN
3.3750 g | Freq: Once | INTRAVENOUS | Status: AC
  Administered 2018-07-20: 3.375 g via INTRAVENOUS
  Filled 2018-07-20: qty 50

## 2018-07-20 MED ORDER — ACETAMINOPHEN 325 MG PO TABS
650.0000 mg | ORAL_TABLET | Freq: Four times a day (QID) | ORAL | Status: DC | PRN
  Administered 2018-07-22: 650 mg via ORAL
  Filled 2018-07-20: qty 2

## 2018-07-20 MED ORDER — INSULIN ASPART 100 UNIT/ML ~~LOC~~ SOLN: 0.0000 [IU] | Freq: Every day | SUBCUTANEOUS | Status: DC

## 2018-07-20 MED ORDER — FUROSEMIDE 10 MG/ML IJ SOLN: 20.0000 mg | Freq: Once | INTRAMUSCULAR | Status: DC

## 2018-07-20 MED ORDER — OXYCODONE HCL 5 MG PO TABS: 10.0000 mg | ORAL_TABLET | Freq: Every evening | ORAL | Status: DC | PRN

## 2018-07-20 MED ORDER — SODIUM CHLORIDE 0.9% FLUSH
3.0000 mL | Freq: Once | INTRAVENOUS | Status: AC
  Administered 2018-07-20: 3 mL via INTRAVENOUS

## 2018-07-20 MED ORDER — ONDANSETRON HCL 4 MG/2ML IJ SOLN: 4.0000 mg | Freq: Four times a day (QID) | INTRAMUSCULAR | Status: DC | PRN

## 2018-07-20 MED ORDER — ACETAMINOPHEN 325 MG PO TABS
650.0000 mg | ORAL_TABLET | Freq: Once | ORAL | Status: AC
  Administered 2018-07-20: 650 mg via ORAL
  Filled 2018-07-20: qty 2

## 2018-07-20 NOTE — ED Notes (Signed)
ED TO INPATIENT HANDOFF REPORT  Name/Age/Gender Jesse Cordova 78 y.o. male  Code Status Code Status History    Date Active Date Inactive Code Status Order ID Comments User Context   06/05/2018 0356 06/05/2018 1712 Full Code [id number]  Reubin Milan, MD ED   06/05/2018 0352 06/05/2018 0356 Full Code [id number]  Reubin Milan, MD ED   12/22/2017 1626 12/23/2017 2040 Full Code [id number]  Eileen Stanford, PA-C Inpatient   11/25/2017 0929 11/25/2017 1647 Full Code [id number]  Burnell Blanks, MD Inpatient   11/10/2011 0740 11/13/2011 1442 Full Code [id number]  Arna Medici, RN Inpatient    Advance Directive Documentation     Most Recent Value  Type of Advance Directive  Living will  Pre-existing out of facility DNR order (yellow form or pink MOST form)  -  "MOST" Form in Place?  -      Home/SNF/Other Home  Chief Complaint altered mental status  Level of Care/Admitting Diagnosis ED Disposition    ED Disposition Condition [place]: [facility] [[id number]]  Level of Care: Telemetry [5]  Diagnosis: Sepsis [facility]) [[id number]]  Admitting Physician: Bethena Roys [phone]  Attending Physician: Bethena Roys [phone]  Estimated length of stay: past midnight tomorrow  Certification:: I certify this patient will need inpatient services for at least 2 midnights  PT Class (Do Not Modify): Inpatient [101]  PT Acc Code (Do Not Modify): Private [1]       Medical History Past Medical History:  Diagnosis Date  . Adrenal nodule ([place]) 11/10/2011  . Arthritis   . Cancer ([place])    skin cancer  . Chronic low back pain   . Complication of anesthesia   . Diabetes mellitus   . GERD (gastroesophageal reflux disease)   . History of hip surgery 06/02/2018   right hip, right knee  . Hyperlipemia   . Hypertension   . 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 PONV (postoperative nausea and vomiting)   . 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 Cortisone Other (See Comments)    swelling  . Tetanus Toxoids Other (See Comments)    Swelling, not specified  . Hydrocodone-Homatropine Nausea And Vomiting    IV Location/Drains/Wounds Patient Lines/Drains/Airways Status   Active Line/Drains/Airways    Name:   Placement date:   Placement time:   Site:   Days:   Peripheral IV 07/20/18 Right Forearm   07/20/18    1403    Forearm   less than 1   Peripheral IV 07/20/18 Left Forearm   07/20/18    1403    Forearm   less than 1   Incision (Closed) 01/05/14 Eye Left   01/05/14    1135     1657   Incision (Closed) 12/22/17 Groin Right   12/22/17    1352     210   Incision (Closed) 12/22/17 Groin Left   12/22/17    1352     210          Labs/Imaging Results for orders placed or performed during the hospital encounter of 07/20/18 (from the past 48 hour(s))  Comprehensive metabolic panel     Status: Abnormal   Collection  Time: 07/20/18  2:01 PM  Result Value Ref Range   Sodium 130 (L) 135 - 145 mmol/L   Potassium 4.6 3.5 - 5.1 mmol/L   Chloride 97 (L) 98 - 111 mmol/L   CO2 21 (L) 22 - 32 mmol/L   Glucose, Bld 148 (H) 70 - 99 mg/dL   BUN 17 8 - 23 mg/dL   Creatinine, Ser 1.17 0.61 - 1.24 mg/dL   Calcium 8.8 (L) 8.9 - 10.3 mg/dL   Total Protein 7.2 6.5 - 8.1 g/dL   Albumin 3.5 3.5 - 5.0 g/dL   AST 26 15 - 41 U/L   ALT 10 0 - 44 U/L   Alkaline Phosphatase 84 38 - 126 U/L   Total Bilirubin 0.7 0.3 - 1.2 mg/dL   GFR calc non Af Amer 59 (L) >60 mL/min   GFR calc Af Amer >60 >60 mL/min   Anion gap 12 5 - 15    Comment: Performed at [facility], [address] [place], [place] [address]  Lactic acid, plasma     Status: None   Collection Time: 07/20/18  2:01 PM  Result Value  Ref Range   Lactic Acid, Venous 1.0 0.5 - 1.9 mmol/L    Comment: Performed at [facility], [address], [place], [place] [address]  CBC with Differential     Status: Abnormal   Collection Time: 07/20/18  2:01 PM  Result Value Ref Range   WBC 3.4 (L) 4.0 - 10.5 K/uL   RBC 3.84 (L) 4.22 - 5.81 MIL/uL   Hemoglobin 11.3 (L) 13.0 - 17.0 g/dL   HCT 35.9 (L) 39.0 - 52.0 %   MCV 93.5 80.0 - 100.0 fL   MCH 29.4 26.0 - 34.0 pg   MCHC 31.5 30.0 - 36.0 g/dL   RDW 15.1 11.5 - 15.5 %   Platelets 386 150 - 400 K/uL   nRBC 0.0 0.0 - 0.2 %   Neutrophils Relative % 48 %   Neutro Abs 1.7 1.7 - 7.7 K/uL   Lymphocytes Relative 25 %   Lymphs Abs 0.8 0.7 - 4.0 K/uL   Monocytes Relative 25 %   Monocytes Absolute 0.8 0.1 - 1.0 K/uL   Eosinophils Relative 0 %   Eosinophils Absolute 0.0 0.0 - 0.5 K/uL   Basophils Relative 1 %   Basophils Absolute 0.0 0.0 - 0.1 K/uL   Immature Granulocytes 1 %   Abs Immature Granulocytes 0.02 0.00 - 0.07 K/uL    Comment: Performed at [facility], [address], [place], [place] [address]  Protime-INR     Status: None   Collection Time: 07/20/18  2:01 PM  Result Value Ref Range   Prothrombin Time 14.5 11.4 - 15.2 seconds   INR 1.14     Comment: Performed at [facility], [address] [place], [place] [address]  Culture, blood (Routine x 2)     Status: None (Preliminary result)   Collection Time: 07/20/18  2:01 PM  Result Value Ref Range   Specimen Description BLOOD RIGHT FOREARM DRAWN BY RN    Special Requests      BOTTLES DRAWN AEROBIC AND ANAEROBIC Blood Culture results may not be optimal due to an excessive volume of blood received in culture bottles Performed at [facility], [address] [place], [place] [address]    Culture PENDING    Report Status PENDING   Urinalysis, Routine w reflex microscopic     Status: Abnormal   Collection Time: 07/20/18  2:01  PM  Result Value Ref Range   Color, Urine YELLOW YELLOW   APPearance CLEAR CLEAR   Specific  Gravity, Urine 1.021 1.005 - 1.030   pH 5.0 5.0 - 8.0   Glucose, UA >=500 (A) NEGATIVE mg/dL   Hgb urine dipstick NEGATIVE NEGATIVE   Bilirubin Urine NEGATIVE NEGATIVE   Ketones, ur 20 (A) NEGATIVE mg/dL   Protein, ur NEGATIVE NEGATIVE mg/dL   Nitrite NEGATIVE NEGATIVE   Leukocytes,Ua NEGATIVE NEGATIVE   RBC / HPF 0-5 0 - 5 RBC/hpf   WBC, UA 0-5 0 - 5 WBC/hpf   Bacteria, UA NONE SEEN NONE SEEN   Squamous Epithelial / LPF 0-5 0 - 5    Comment: Performed at [facility], [address], [place], [place] [address]  Culture, blood (Routine x 2)     Status: None (Preliminary result)   Collection Time: 07/20/18  2:06 PM  Result Value Ref Range   Specimen Description BLOOD LEFT FOREARM DRAWN BY RN    Special Requests      BOTTLES DRAWN AEROBIC AND ANAEROBIC Blood Culture results may not be optimal due to an excessive volume of blood received in culture bottles Performed at [facility], [address] [place], [place] [address]    Culture PENDING    Report Status PENDING   Influenza panel by PCR (type A & B)     Status: None   Collection Time: 07/20/18  2:24 PM  Result Value Ref Range   Influenza A By PCR NEGATIVE NEGATIVE   Influenza B By PCR NEGATIVE NEGATIVE    Comment: (NOTE) The Xpert Xpress Flu assay is intended as an aid in the diagnosis of  influenza and should not be used as a sole basis for treatment.  This  assay is FDA approved for nasopharyngeal swab specimens only. Nasal  washings and aspirates are unacceptable for Xpert Xpress Flu testing. Performed at [facility], [address], [place], [place] [address]   Lactic acid, plasma     Status: None   Collection Time: 07/20/18  3:49 PM  Result Value Ref Range   Lactic Acid, Venous 0.9 0.5 - 1.9 mmol/L    Comment: Performed at [facility], [address], [place], [place] [address]   Dg Chest Portable 1 View  Result Date: 07/20/2018 CLINICAL DATA:  Fever EXAM: PORTABLE CHEST 1 VIEW COMPARISON:  06/05/2018 FINDINGS: No  focal consolidation or significant effusion. Normal cardiomediastinal silhouette with aortic atherosclerosis. No pneumothorax. IMPRESSION: No acute focal airspace disease. Electronically Signed   By: Donavan Foil M.D.   On: 07/20/2018 14:40    Pending Labs Unresulted Labs (From admission, onward)    Start     Ordered   07/20/18 1638  Culture, Urine  Add-on,   R     07/20/18 1637   Signed and Held  Basic metabolic panel  Tomorrow morning,   R     Signed and Held   Signed and Held  CBC  Tomorrow morning,   R     Signed and Held          Vitals/Pain Today's Vitals   07/20/18 1700 07/20/18 1730 07/20/18 1800 07/20/18 1830  BP: (!) 106/48 (!) 113/40 115/61 (!) 114/56  Pulse: (!) 110 (!) 110 (!) 109 (!) 110  Resp: 18 19 17 18   Temp:      TempSrc:      SpO2: 96% 96% 97% 97%  Weight:      Height:  PainSc:        Isolation Precautions No active isolations  Medications Medications  vancomycin (VANCOCIN) IVPB 750 mg/150 ml premix (has no administration in time range)  piperacillin-tazobactam (ZOSYN) IVPB 3.375 g (has no administration in time range)  sodium chloride flush (NS) 0.9 % injection 3 mL (3 mLs Intravenous Given 07/20/18 1422)  sodium chloride 0.9 % bolus 2,000 mL (0 mLs Intravenous Stopped 07/20/18 1548)  acetaminophen (TYLENOL) tablet 650 mg (650 mg Oral Given 07/20/18 1432)  vancomycin (VANCOCIN) IVPB 1000 mg/200 mL premix (0 mg Intravenous Stopped 07/20/18 1613)  piperacillin-tazobactam (ZOSYN) IVPB 3.375 g (0 g Intravenous Stopped 07/20/18 1505)    Mobility walks

## 2018-07-20 NOTE — ED Notes (Signed)
X-ray at bedside

## 2018-07-20 NOTE — H&P (Signed)
History and Physical    Jesse Cordova EVO:[id number] DOB: [birth year] DOA: 07/20/2018  PCP: [name], MD   Patient coming from: Home  I have personally briefly reviewed patient's old medical records in [place]  Chief Complaint: Generalized weakness  HPI: JAY HASKEW is a 78 y.o. 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  Apart from reports of patient wanting to talk it or drink, daughters deny confusion or disorientation.  Patient reports nausea, and 2 episodes of loose stools daily for the past 2 days, none today , no vomiting.  No abdominal pain.  No difficulty breathing or cough.  No dysuria.  No headaches or neck stiffness.  No chest pain.  No ulcers or wounds.  No recent dental work-up.  Patient has had poor p.o. intake over the past few weeks.  Blood sugar this morning was 60 at home.  Patient recently was brought to [facility] for femoral neck fracture, after a mechanical fall but there was concern for pathologic fracture 06/2018.  Patient completed short course of Lovenox for DVT prophylaxis.  ED Course: febrile 101.6, tachycardic to 121, WBC 3.4.  Normal lactic acid 1.  UA no bacteria, 0-5 WBC, ketones positive.  Chest x-ray negative for acute abnormality.  Sinus tachycardia.  Influenza panel negative.  Patient was given 2 L bolus, blood cultures obtained and started on broad-spectrum antibiotics - vancomycin and Zosyn, hospitalist to admit for sepsis.   Review of Systems: As per HPI all other systems reviewed and negative.  Past Medical History:  Diagnosis Date  . Adrenal nodule ([place]) 11/10/2011  . Arthritis   . Cancer ([place])    skin cancer  . Chronic low back pain   . Complication of anesthesia   . Diabetes mellitus   . GERD  (gastroesophageal reflux disease)   . History of hip surgery 06/02/2018   right hip, right knee  . Hyperlipemia   . Hypertension   . 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 PONV (postoperative nausea and vomiting)   . 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 BIOPSY  01/16/2012   Procedure: BIOPSY;  Surgeon: Rogene Houston, MD;  Location: [facility];  Service: Endoscopy;  Laterality: N/A;  . CATARACT EXTRACTION Right 7/06  . CATARACT EXTRACTION W/PHACO Left 01/05/2014   Procedure: CATARACT EXTRACTION PHACO AND INTRAOCULAR LENS PLACEMENT (IOC);  Surgeon: Tonny Branch, MD;  Location: [facility] ORS;  Service: Ophthalmology;  Laterality: Left;  CDE:15.98  . COLONOSCOPY  01/29/12   abnormal repeat in one year  . ESOPHAGOGASTRODUODENOSCOPY    . KNEE ARTHROSCOPY     2 right knee surgeries and 1 left knee surgery  . RIGHT/LEFT HEART CATH AND CORONARY ANGIOGRAPHY N/A 11/25/2017   Procedure: RIGHT/LEFT HEART CATH AND CORONARY ANGIOGRAPHY;  Surgeon: Burnell Blanks, MD;  Location: [place] CV LAB;  Service: Cardiovascular;  Laterality: N/A;  . TEE WITHOUT CARDIOVERSION N/A 12/22/2017   Procedure: TRANSESOPHAGEAL ECHOCARDIOGRAM (TEE);  Surgeon: Burnell Blanks, MD;  Location: [place];  Service: Open Heart Surgery;  Laterality: N/A;  . TONSILLECTOMY    . TRANSCATHETER AORTIC VALVE REPLACEMENT, TRANSFEMORAL N/A 12/22/2017   Procedure: TRANSCATHETER AORTIC VALVE REPLACEMENT, TRANSFEMORAL;  Surgeon: Burnell Blanks, MD;  Location: [place];  Service: Open Heart Surgery;  Laterality: N/A;  . VASECTOMY       reports that he has never smoked. He has never used smokeless tobacco. He reports that he does not drink alcohol or  use drugs.  Allergies  Allergen Reactions  . Cortisone Other (See Comments)    swelling  . Tetanus Toxoids Other (See Comments)    Swelling, not specified  . Hydrocodone-Homatropine Nausea And Vomiting    Family History  Problem Relation Age of Onset  . Diabetes Sister   . Heart disease Sister   . Atrial fibrillation Sister   . Heart disease Other   . Diabetes Other   . Depression Maternal Grandfather     Prior to Admission medications   Medication Sig Start Date End Date Taking? Authorizing Provider  acetaminophen (TYLENOL) 500 MG tablet Take 500 mg by mouth every 4 (four) hours as needed for mild pain or moderate pain.    Yes [provider]  Cholecalciferol (DIALYVITE VITAMIN D 5000) 125 MCG (5000 UT) capsule Take 5,000 Units by mouth daily.   Yes [provider]  empagliflozin (JARDIANCE) 25 MG TABS tablet Take 25 mg by mouth daily.   Yes [provider]  glipiZIDE (GLUCOTROL) 5 MG tablet TAKE 1/2 TABLET BY MOUTH TWICE DAILY BEFORE A MEAL. Patient taking differently: Take 2.5 mg by mouth 2 (two) times daily before a meal.  07/19/18  Yes Luking, Scott A, MD  insulin glargine (LANTUS) 100 UNIT/ML injection Inject 20 Units into the skin at bedtime.    Yes [provider]  lovastatin (MEVACOR) 20 MG tablet Take 20 mg by mouth every evening.   Yes [provider]  morphine (MS CONTIN) 15 MG 12 hr tablet Take 15 mg by mouth every 12 (twelve) hours as needed. For pain 07/16/18  Yes [provider]  ondansetron (ZOFRAN) 8 MG tablet Take 8 mg by mouth every 8 (eight) hours as needed for nausea or vomiting.   Yes [provider]  Oxycodone HCl 10 MG TABS 1 every 4 hours as needed severe pain caution drowsiness metastatic cancer Patient taking differently: Take 10 mg by mouth at bedtime as needed. 1 every 4 hours as needed severe pain caution drowsiness metastatic cancer 07/06/18  Yes Luking, Scott A, MD  pantoprazole (PROTONIX) 40  MG tablet Take 40 mg by mouth daily.   Yes [provider]  polyethylene glycol (MIRALAX / GLYCOLAX) packet Take 17 g by mouth 2 (two) times daily.   Yes [provider]  prochlorperazine (COMPAZINE) 10 MG tablet Take 10 mg by mouth every 6 (six) hours as needed for nausea or vomiting.   Yes [provider]  enoxaparin (LOVENOX) 40 MG/0.4ML injection Inject 40 mg into the skin daily.    [provider]    Physical Exam: Vitals:   07/20/18 1500 07/20/18 1530 07/20/18 1549 07/20/18 1600  BP: (!) 115/56 (!) 111/54  (!) 109/58  Pulse: (!) 115 (!) 111  (!) 112  Resp:  13  14  Temp:   99.5 F (37.5 C)   TempSrc:   Oral   SpO2: 95% 95%  96%  Weight:      Height:        Constitutional: NAD, calm, comfortable  Vitals:   07/20/18 1500 07/20/18 1530 07/20/18 1549 07/20/18 1600  BP: (!) 115/56 (!) 111/54  (!) 109/58  Pulse: (!) 115 (!) 111  (!) 112  Resp:  13  14  Temp:   99.5 F (37.5 C)   TempSrc:   Oral   SpO2: 95% 95%  96%  Weight:      Height:       Eyes: PERRL, lids and conjunctivae normal ENMT: Mucous membranes are dry. Posterior pharynx clear of any exudate or lesions. Dentures present. Neck: normal, supple, no masses, no thyromegaly Respiratory: clear to auscultation bilaterally, no wheezing, no crackles. Normal respiratory effort. No accessory muscle use.  Cardiovascular: Tachycardic, Regular rate and rhythm, no murmurs / rubs / gallops. No extremity edema. 2+ pedal pulses. No carotid bruits.  Abdomen: no tenderness, no masses palpated. No hepatosplenomegaly. Bowel sounds positive.  Musculoskeletal: no clubbing / cyanosis. No joint deformity upper and lower extremities. Good ROM, no contractures. Normal muscle tone.  Skin: no rashes, lesions, ulcers. No induration Neurologic: CN 2-12 grossly intact.   Strength 5/5 in all 4.  Psychiatric: Normal judgment and insight. Alert and oriented x 3. Normal mood.   Labs on Admission: I have personally  reviewed following labs and imaging studies  CBC: Recent Labs  Lab 07/15/18 1152 07/20/18 1401  WBC 1.9* 3.4*  NEUTROABS 0.5* 1.7  HGB 10.1* 11.3*  HCT 32.9* 35.9*  MCV 97.9 93.5  PLT 235 073   Basic Metabolic Panel: Recent Labs  Lab 07/15/18 1152 07/20/18 1401  NA 136 130*  K 4.0 4.6  CL 102 97*  CO2 25 21*  GLUCOSE 220* 148*  BUN 21 17  CREATININE 1.38* 1.17  CALCIUM 8.6* 8.8*   Liver Function Tests: Recent Labs  Lab 07/20/18 1401  AST 26  ALT 10  ALKPHOS 84  BILITOT 0.7  PROT 7.2  ALBUMIN 3.5   Coagulation Profile: Recent Labs  Lab 07/20/18 1401  INR 1.14   CBG: Recent Labs  Lab 07/15/18 1041  GLUCAP 190*    Urine analysis:    Component Value Date/Time   COLORURINE YELLOW 07/20/2018 1401   APPEARANCEUR CLEAR 07/20/2018 1401   LABSPEC 1.021 07/20/2018 1401   PHURINE 5.0 07/20/2018 1401   GLUCOSEU >=500 (A) 07/20/2018 1401   HGBUR NEGATIVE 07/20/2018 1401   BILIRUBINUR NEGATIVE 07/20/2018 1401   KETONESUR 20 (A) 07/20/2018 1401   PROTEINUR NEGATIVE 07/20/2018 1401   UROBILINOGEN 0.2 11/10/2011 0040   NITRITE NEGATIVE 07/20/2018 1401   LEUKOCYTESUR NEGATIVE 07/20/2018 1401    Radiological Exams on Admission: Dg Chest Portable 1 View  Result Date: 07/20/2018 CLINICAL DATA:  Fever EXAM: PORTABLE CHEST 1 VIEW COMPARISON:  06/05/2018 FINDINGS: No focal consolidation or significant effusion. Normal cardiomediastinal silhouette with aortic atherosclerosis. No pneumothorax. IMPRESSION: No acute focal airspace disease. Electronically Signed   By: Donavan Foil M.D.   On: 07/20/2018 14:40    EKG: Independently reviewed.  Sinus rhythm.  Artifacts.  Assessment/Plan Active Problems:   Sepsis ([place])  Sepsis-tachycardia, febrile. Leukopenic-sepsis versus chemotherapy versus malignancy.  Ruled in for sepsis but with normal lactic acid 1.  UA and chest x-ray, history does not suggest focus of infection at this time.  Influenza negative. 2L bolus  given. - Continue broad-spectrum antibiotics with vancomycin and Zosyn -Follow-up blood cultures -Add on urine cultures -CBC BMP a.m. - IVF N/s 100cc/hr x 15 hrs -If bacteremic patient has history of TAVR will need evaluation  Merkel cell carcinoma of the face-patient with  high disease burden, follows with hem[facility], just started on chemo 1/30 and will be starting immunotherapy 2/21.  Leukopenia- 3.4, but with absolute neutrophil count 1.7 WNL.  -Continue home pain control with MS Contin 15 every 12hr and oxycodone 10 nightly.  TAVR-for severe aortic stenosis, 12/22/17.  Aspirin and Plavix mentioned in cardiology note but this is not on patient's med list currently- ?  Probably discontinued  while on Lovenox for DVT prophylaxis for recent hip surgery.  HTN-systolic [id number]. Not on antihypertensives.  DM-glucose 108.  Reported blood sugar 60 this a.m. -Hold home Lantus, canagliflozin, glipizide. - SSI  DVT prophylaxis: Lovenox Code Status: Full Family Communication: 2 daughters at bedside Disposition Plan: Per rounding team Consults called: None Admission status: inpt, tele.  Patient will require at least 2 midnights to rule out bacteremia on blood cultures, considering new immunocompromised status with active malignancy and chemotherapy,  while receiving broad-spectrum antibiotics IV.   Bethena Roys MD [facility]  07/20/2018, 5:56 PM

## 2018-07-20 NOTE — ED Notes (Signed)
Attempted to call report

## 2018-07-20 NOTE — ED Provider Notes (Signed)
[facility] EMERGENCY DEPARTMENT Provider Note   CSN: [id number] Arrival date & time: 07/20/18  1351    History   Chief Complaint Chief Complaint  Patient presents with  . Altered Mental Status    HPI Jesse Cordova is a 78 y.o. male.     Presents to the emergency department with significant fatigue and fever starting today.  He is unable to stand up and walk now.  He does not have any other complaints.  His wife recently died in the last week and he is getting chemotherapy for Merkel cancer.  He received his chemotherapy last 3 weeks ago  The history is provided by the patient. No language interpreter was used.  Fever  Max temp prior to arrival:  101.6 Temp source:  Oral Severity:  Moderate Onset quality:  Sudden Timing:  Constant Progression:  Worsening Chronicity:  New Relieved by:  Acetaminophen Worsened by:  Nothing Ineffective treatments:  None tried Associated symptoms: no chest pain, no congestion, no cough, no diarrhea, no headaches and no rash   Risk factors: no contaminated food     Past Medical History:  Diagnosis Date  . 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 Arthritis   . Cancer ([place])    skin cancer  . Chronic low back pain   . Complication of anesthesia   . Diabetes mellitus   . GERD (gastroesophageal reflux disease)   . History of hip surgery 06/02/2018   right hip, right knee  . Hyperlipemia   . Hypertension   . 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 PONV (postoperative nausea and vomiting)   . 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 CKD stage 2 due to type 2 diabetes mellitus ([place]) 07/06/2018  . Fracture of femoral neck, right, closed ([place]) 06/05/2018  . GERD  (gastroesophageal reflux disease) 06/05/2018  . CAD (coronary artery disease) 06/05/2018  . Chest pain 06/05/2018  . 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 Severe aortic stenosis 12/22/2017  . Merkel cell carcinoma of face ([place]) 02/18/2016  . Essential hypertension, benign 11/15/2012  . Hyperlipidemia 11/15/2012  . Controlled diabetes mellitus type 2 with complications ([place]) 24/23/5361  . Adrenal nodule ([place]) 11/10/2011    Past Surgical History:  Procedure Laterality Date  . BIOPSY  01/16/2012   Procedure: BIOPSY;  Surgeon: Rogene Houston, MD;  Location: [facility] ENDO SUITE;  Service: Endoscopy;  Laterality: N/A;  . CATARACT EXTRACTION Right 7/06  . CATARACT EXTRACTION W/PHACO Left 01/05/2014   Procedure: CATARACT EXTRACTION PHACO AND INTRAOCULAR LENS PLACEMENT (IOC);  Surgeon: Tonny Branch, MD;  Location: [facility] ORS;  Service: Ophthalmology;  Laterality: Left;  CDE:15.98  . COLONOSCOPY  01/29/12   abnormal repeat in one year  . ESOPHAGOGASTRODUODENOSCOPY    . KNEE ARTHROSCOPY     2 right knee surgeries and 1 left knee surgery  . RIGHT/LEFT HEART CATH AND CORONARY ANGIOGRAPHY N/A 11/25/2017   Procedure: RIGHT/LEFT HEART CATH AND CORONARY ANGIOGRAPHY;  Surgeon: Burnell Blanks, MD;  Location: [place] CV LAB;  Service: Cardiovascular;  Laterality: N/A;  . TEE WITHOUT CARDIOVERSION N/A 12/22/2017   Procedure: TRANSESOPHAGEAL ECHOCARDIOGRAM (TEE);  Surgeon: Burnell Blanks, MD;  Location: [place];  Service: Open Heart  Surgery;  Laterality: N/A;  . TONSILLECTOMY    . TRANSCATHETER AORTIC VALVE REPLACEMENT, TRANSFEMORAL N/A 12/22/2017   Procedure: TRANSCATHETER AORTIC VALVE REPLACEMENT, TRANSFEMORAL;  Surgeon: Burnell Blanks, MD;  Location: [place];  Service: Open Heart Surgery;  Laterality: N/A;  . VASECTOMY          Home Medications    Prior to Admission medications   Medication Sig Start Date End Date Taking? Authorizing Provider  acetaminophen  (TYLENOL) 500 MG tablet Take 500 mg by mouth every 4 (four) hours as needed for mild pain or moderate pain.    Yes [provider]  Cholecalciferol (DIALYVITE VITAMIN D 5000) 125 MCG (5000 UT) capsule Take 5,000 Units by mouth daily.   Yes [provider]  empagliflozin (JARDIANCE) 25 MG TABS tablet Take 25 mg by mouth daily.   Yes [provider]  glipiZIDE (GLUCOTROL) 5 MG tablet TAKE 1/2 TABLET BY MOUTH TWICE DAILY BEFORE A MEAL. Patient taking differently: Take 2.5 mg by mouth 2 (two) times daily before a meal.  07/19/18  Yes Luking, Scott A, MD  insulin glargine (LANTUS) 100 UNIT/ML injection Inject 20 Units into the skin at bedtime.    Yes [provider]  lovastatin (MEVACOR) 20 MG tablet Take 20 mg by mouth every evening.   Yes [provider]  morphine (MS CONTIN) 15 MG 12 hr tablet Take 15 mg by mouth every 12 (twelve) hours as needed. For pain 07/16/18  Yes [provider]  ondansetron (ZOFRAN) 8 MG tablet Take 8 mg by mouth every 8 (eight) hours as needed for nausea or vomiting.   Yes [provider]  Oxycodone HCl 10 MG TABS 1 every 4 hours as needed severe pain caution drowsiness metastatic cancer Patient taking differently: Take 10 mg by mouth at bedtime as needed. 1 every 4 hours as needed severe pain caution drowsiness metastatic cancer 07/06/18  Yes Luking, Scott A, MD  pantoprazole (PROTONIX) 40 MG tablet Take 40 mg by mouth daily.   Yes [provider]  polyethylene glycol (MIRALAX / GLYCOLAX) packet Take 17 g by mouth 2 (two) times daily.   Yes [provider]  prochlorperazine (COMPAZINE) 10 MG tablet Take 10 mg by mouth every 6 (six) hours as needed for nausea or vomiting.   Yes [provider]  enoxaparin (LOVENOX) 40 MG/0.4ML injection Inject 40 mg into the skin daily.    [provider]    Family History Family History  Problem Relation Age of Onset  . Diabetes Sister   .  Heart disease Sister   . Atrial fibrillation Sister   . Heart disease Other   . Diabetes Other   . Depression Maternal Grandfather     Social History Social History   Tobacco Use  . Smoking status: Never Smoker  . Smokeless tobacco: Never Used  . Tobacco comment: From spouse. Wife does not smoke around him now  Substance Use Topics  . Alcohol use: No    Alcohol/week: 0.0 standard drinks  . Drug use: No     Allergies   Cortisone; Tetanus toxoids; and Hydrocodone-homatropine   Review of Systems Review of Systems  Constitutional: Positive for fatigue and fever. Negative for appetite change.  HENT: Negative for congestion, ear discharge and sinus pressure.   Eyes: Negative for discharge.  Respiratory: Negative for cough.   Cardiovascular: Negative for chest pain.  Gastrointestinal: Negative for abdominal pain and diarrhea.  Genitourinary: Negative for frequency and hematuria.  Musculoskeletal:  Negative for back pain.  Skin: Negative for rash.  Neurological: Negative for seizures and headaches.  Psychiatric/Behavioral: Negative for hallucinations.     Physical Exam Updated Vital Signs BP (!) 109/58   Pulse (!) 112   Temp 99.5 F (37.5 C) (Oral)   Resp 14   Ht 6\' 1"  (1.854 m)   Wt 71 kg   SpO2 96%   BMI 20.65 kg/m   Physical Exam Vitals signs and nursing note reviewed.  Constitutional:      Appearance: He is well-developed.  HENT:     Head: Normocephalic.     Nose: Nose normal.  Eyes:     General: No scleral icterus.    Conjunctiva/sclera: Conjunctivae normal.  Neck:     Musculoskeletal: Neck supple.     Thyroid: No thyromegaly.  Cardiovascular:     Rate and Rhythm: Regular rhythm.     Heart sounds: No murmur. No friction rub. No gallop.      Comments: Tachycardia Pulmonary:     Breath sounds: No stridor. No wheezing or rales.  Chest:     Chest wall: No tenderness.  Abdominal:     General: There is no distension.     Tenderness: There is no  abdominal tenderness. There is no rebound.  Musculoskeletal: Normal range of motion.  Lymphadenopathy:     Cervical: No cervical adenopathy.  Skin:    Findings: No erythema or rash.  Neurological:     Mental Status: He is alert and oriented to person, place, and time.     Motor: No abnormal muscle tone.     Coordination: Coordination normal.  Psychiatric:        Behavior: Behavior normal.      ED Treatments / Results  Labs (all labs ordered are listed, but only abnormal results are displayed) Labs Reviewed  COMPREHENSIVE METABOLIC PANEL - Abnormal; Notable for the following components:      Result Value   Sodium 130 (*)    Chloride 97 (*)    CO2 21 (*)    Glucose, Bld 148 (*)    Calcium 8.8 (*)    GFR calc non Af Amer 59 (*)    All other components within normal limits  CBC WITH DIFFERENTIAL/PLATELET - Abnormal; Notable for the following components:   WBC 3.4 (*)    RBC 3.84 (*)    Hemoglobin 11.3 (*)    HCT 35.9 (*)    All other components within normal limits  URINALYSIS, ROUTINE W REFLEX MICROSCOPIC - Abnormal; Notable for the following components:   Glucose, UA >=500 (*)    Ketones, ur 20 (*)    All other components within normal limits  CULTURE, BLOOD (ROUTINE X 2)  CULTURE, BLOOD (ROUTINE X 2)  URINE CULTURE  LACTIC ACID, PLASMA  PROTIME-INR  INFLUENZA PANEL BY PCR (TYPE A & B)  LACTIC ACID, PLASMA    EKG None  Radiology Dg Chest Portable 1 View  Result Date: 07/20/2018 CLINICAL DATA:  Fever EXAM: PORTABLE CHEST 1 VIEW COMPARISON:  06/05/2018 FINDINGS: No focal consolidation or significant effusion. Normal cardiomediastinal silhouette with aortic atherosclerosis. No pneumothorax. IMPRESSION: No acute focal airspace disease. Electronically Signed   By: Donavan Foil M.D.   On: 07/20/2018 14:40    Procedures Procedures (including critical care time)  Medications Ordered in ED Medications  sodium chloride flush (NS) 0.9 % injection 3 mL (3 mLs  Intravenous Given 07/20/18 1422)  sodium chloride 0.9 % bolus 2,000 mL (0 mLs Intravenous  Stopped 07/20/18 1548)  acetaminophen (TYLENOL) tablet 650 mg (650 mg Oral Given 07/20/18 1432)  vancomycin (VANCOCIN) IVPB 1000 mg/200 mL premix (0 mg Intravenous Stopped 07/20/18 1613)  piperacillin-tazobactam (ZOSYN) IVPB 3.375 g (0 g Intravenous Stopped 07/20/18 1505)     Initial Impression / Assessment and Plan / ED Course  I have reviewed the triage vital signs and the nursing notes.  Pertinent labs & imaging results that were available during my care of the patient were reviewed by me and considered in my medical decision making (see chart for details).    Labs show hyponatremia mildly elevated glucose.  Patient has had blood cultures done.  Chest x-ray and urinalysis do not show any infection.  Patient will be admitted to medicine for continued tachycardia and fever  Final Clinical Impressions(s) / ED Diagnoses   Final diagnoses:  Febrile illness, acute    ED Discharge Orders    None       Milton Ferguson, MD 07/20/18 1640

## 2018-07-20 NOTE — ED Triage Notes (Addendum)
Pt brought in by EMS. Due to weakness and altered mental status due to not wanting to speak as usual. Also reports weakness Wife recently died. Pt initially did not want to come. Pt has cancer. CBG 152.Marland Kitchen HR 122.

## 2018-07-20 NOTE — Progress Notes (Signed)
Pharmacy Antibiotic Note  Jesse Cordova is a 78 y.o. male admitted on 07/20/2018 with sepsis.  Pharmacy has been consulted for Vancomycin and zosyn dosing.  Plan: Vancomycin 1000mg  IV loading dose, then 750mg  IV every 12 hours.  Goal trough 15-20 mcg/mL. Zosyn 3.375g IV q8h (4 hour infusion).  F/U cxs and clinical progress Monitor V/S, labs and levels as indicated  Height: 6\' 1"  (185.4 cm) Weight: 156 lb 8.4 oz (71 kg) IBW/kg (Calculated) : 79.9  Temp (24hrs), Avg:100.6 F (38.1 C), Min:99.5 F (37.5 C), Max:101.6 F (38.7 C)  Recent Labs  Lab 07/15/18 1152 07/20/18 1401 07/20/18 1549  WBC 1.9* 3.4*  --   CREATININE 1.38* 1.17  --   LATICACIDVEN  --  1.0 0.9    Estimated Creatinine Clearance: 52.3 mL/min (by C-G formula based on SCr of 1.17 mg/dL).    Allergies  Allergen Reactions  . Cortisone Other (See Comments)    swelling  . Tetanus Toxoids Other (See Comments)    Swelling, not specified  . Hydrocodone-Homatropine Nausea And Vomiting    Antimicrobials this admission: Vancomycin 2/18 >>  Zosyn 2/18 >>   Dose adjustments this admission: N/A  Microbiology results: 2/18 BCx: pending 2/18 UCx: pending  MRSA PCR:   Thank you for allowing pharmacy to be a part of this patient's care.  Isac Sarna, BS [place], [place] Clinical Pharmacist Pager [phone] 07/20/2018 5:12 PM

## 2018-07-21 LAB — CBC
HCT: 32.9 % — ABNORMAL LOW (ref 39.0–52.0)
Hemoglobin: 10.3 g/dL — ABNORMAL LOW (ref 13.0–17.0)
MCH: 29.9 pg (ref 26.0–34.0)
MCHC: 31.3 g/dL (ref 30.0–36.0)
MCV: 95.4 fL (ref 80.0–100.0)
Platelets: 375 10*3/uL (ref 150–400)
RBC: 3.45 MIL/uL — ABNORMAL LOW (ref 4.22–5.81)
RDW: 15.2 % (ref 11.5–15.5)
WBC: 4.7 10*3/uL (ref 4.0–10.5)
nRBC: 0 % (ref 0.0–0.2)

## 2018-07-21 LAB — BASIC METABOLIC PANEL
Anion gap: 11 (ref 5–15)
BUN: 15 mg/dL (ref 8–23)
CO2: 22 mmol/L (ref 22–32)
Calcium: 8.2 mg/dL — ABNORMAL LOW (ref 8.9–10.3)
Chloride: 99 mmol/L (ref 98–111)
Creatinine, Ser: 1.19 mg/dL (ref 0.61–1.24)
GFR calc Af Amer: >60 mL/min (ref >60)
GFR, EST NON AFRICAN AMERICAN: 58 mL/min — AB (ref >60)
Glucose, Bld: 98 mg/dL (ref 70–99)
Potassium: 3.9 mmol/L (ref 3.5–5.1)
SODIUM: 132 mmol/L — AB (ref 135–145)

## 2018-07-21 LAB — GLUCOSE, CAPILLARY
Glucose-Capillary: 95 mg/dL (ref 70–99)
Glucose-Capillary: 116 mg/dL — ABNORMAL HIGH (ref 70–99)
Glucose-Capillary: 103 mg/dL — ABNORMAL HIGH (ref 70–99)
Glucose-Capillary: 171 mg/dL — ABNORMAL HIGH (ref 70–99)

## 2018-07-21 MED ORDER — METOPROLOL TARTRATE 25 MG PO TABS
12.5000 mg | ORAL_TABLET | Freq: Two times a day (BID) | ORAL | Status: DC
  Administered 2018-07-21 – 2018-07-23 (×4): 12.5 mg via ORAL
  Filled 2018-07-21 (×5): qty 1

## 2018-07-21 MED ORDER — ASPIRIN 81 MG PO CHEW
81.0000 mg | CHEWABLE_TABLET | Freq: Every day | ORAL | Status: DC
  Administered 2018-07-21 – 2018-07-23 (×3): 81 mg via ORAL
  Filled 2018-07-21 (×3): qty 1

## 2018-07-21 MED ORDER — VANCOMYCIN HCL IN DEXTROSE 750-5 MG/150ML-% IV SOLN
INTRAVENOUS | Status: AC
  Filled 2018-07-21: qty 150

## 2018-07-21 MED ORDER — ENSURE ENLIVE PO LIQD
237.0000 mL | Freq: Two times a day (BID) | ORAL | Status: DC
  Administered 2018-07-23 (×2): 237 mL via ORAL

## 2018-07-21 MED ORDER — SODIUM CHLORIDE 0.9 % IV SOLN
INTRAVENOUS | Status: AC
  Administered 2018-07-21: 17:00:00 via INTRAVENOUS

## 2018-07-21 NOTE — Progress Notes (Signed)
Patient Demographics:    Jesse Cordova, is a 78 y.o. male, DOB - 11/30/1940, NTI:[id number]  Admit date - 07/20/2018   Admitting Physician Ejiroghene Arlyce Dice, MD  Outpatient Primary MD for the patient is Jesse Drown, MD  LOS - 1   Chief Complaint  Patient presents with  . Altered Mental Status        Subjective:     Jesse Cordova today has no emesis,  No chest pain, complains of fatigue and malaise, possible chills  Assessment  & Plan :    Active Problems:   Sepsis [facility])  Brief summary 78 y.o. 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 admitted on 07/20/2018 with fevers up to 101.6, tachycardia with heart rate of 121 leukopenia with WBC of 3.4 without any focus of infection, added on Vanco and Zosyn pending cultures  Plan:- 1)SIRS----fever curve trending down, WBC up to 4.7, sats pathophysiology appears to be improving overall, patient never had elevated lactic acid levels, continue IV fluids, continue Vantin Zosyn pending cultures, chest x-ray negative  2)Merkel cell carcinoma of the face--- patient apparently has pretty high disease burden, undergoing chemo at [facility] hematology oncology department, last chemo treatment 1/30/ 2020, plans are for immunotherapy starting 07/23/2018-----, WBC trending up  3)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--- stable, if positive blood cultures will get echo or TEE  4)DM2-A1c was 6.6 on 03/21/2018 reflecting excellent diabetic control, hold Jardiance, Lantus and Glucotrol, Use Novolog/Humalog Sliding scale insulin with Accu-Cheks/Fingersticks as ordered  5)H/o CAD --stable, no ACS type symptoms restart metoprolol 12.5 mg twice daily, continue pravastatin 20 mg daily, aspirin 81 mg daily    Disposition/Need for in-Hospital Stay- patient unable to be discharged at this time due to SIRS (Fever,  leukopenia and tachycardia) in a patient who is immunocompromised due to ongoing chemo treatments for skin cancer--- continue IV Vanco and Zosyn pending cultures  Code Status : Full  Family Communication:   None at bedside   Disposition Plan  : TBD  Consults  :  na  DVT Prophylaxis  :  Lovenox -  Lab Results  Component Value Date   PLT 375 07/21/2018    Inpatient Medications  Scheduled Meds: . aspirin  81 mg Oral Daily  . enoxaparin (LOVENOX) injection  40 mg Subcutaneous Q24H  . [START ON 07/22/2018] feeding supplement (ENSURE ENLIVE)  237 mL Oral BID BM  . insulin aspart  0-5 Units Subcutaneous QHS  . metoprolol tartrate  12.5 mg Oral BID  . morphine  15 mg Oral Q12H  . pravastatin  20 mg Oral q1800   Continuous Infusions: . piperacillin-tazobactam (ZOSYN)  IV 3.375 g (07/21/18 1447)  . vancomycin 750 mg (07/21/18 1445)   PRN Meds:.acetaminophen **OR** acetaminophen, ondansetron **OR** ondansetron (ZOFRAN) IV, oxyCODONE    Anti-infectives (From admission, onward)   Start     Dose/Rate Route Frequency Ordered Stop   07/21/18 0300  vancomycin (VANCOCIN) IVPB 750 mg/150 ml premix     750 mg 150 mL/hr over 60 Minutes Intravenous Every 12 hours 07/20/18 1716     07/20/18 2200  piperacillin-tazobactam (ZOSYN) IVPB 3.375 g     3.375 g 12.5 mL/hr over 240 Minutes Intravenous  Every 8 hours 07/20/18 1716     07/20/18 1430  vancomycin (VANCOCIN) IVPB 1000 mg/200 mL premix     1,000 mg 200 mL/hr over 60 Minutes Intravenous  Once 07/20/18 1415 07/20/18 1613   07/20/18 1430  piperacillin-tazobactam (ZOSYN) IVPB 3.375 g     3.375 g 100 mL/hr over 30 Minutes Intravenous  Once 07/20/18 1415 07/20/18 1505        Objective:   Vitals:   07/20/18 2030 07/20/18 2031 07/20/18 2146 07/21/18 0519  BP:  121/61  (!) 101/48  Pulse:  (!) 117  (!) 108  Resp:    16  Temp:  98.3 F (36.8 C)  100.3 F (37.9 C)  TempSrc:  Oral  Oral  SpO2:  98% 98% 99%  Weight: 79.1 kg     Height:  6\' 1"  (1.854 m)       Wt Readings from Last 3 Encounters:  07/20/18 79.1 kg  07/15/18 71.7 kg  06/05/18 79.3 kg     Intake/Output Summary (Last 24 hours) at 07/21/2018 1709 Last data filed at 07/21/2018 1300 Gross per 24 hour  Intake 1040 ml  Output 400 ml  Net 640 ml     Physical Exam Patient is examined daily including today on 07/21/18 , exams remain the same as of yesterday except that has changed   Gen:- Awake Alert,  In no apparent distress  HEENT:- [place].AT, No sclera icterus Neck-Supple Neck,No JVD,.  Lungs-  CTAB , fair symmetrical air movement CV- S1, S2 normal, regular  Abd-  +ve B.Sounds, Abd Soft, No tenderness,    Extremity/Skin:- No  edema, pedal pulses present  Psych-affect is appropriate, oriented x3 Neuro-no new focal deficits, no tremors   Data Review:   Micro Results Recent Results (from the past 240 hour(s))  Culture, blood (Routine x 2)     Status: None (Preliminary result)   Collection Time: 07/20/18  2:01 PM  Result Value Ref Range Status   Specimen Description BLOOD RIGHT FOREARM DRAWN BY RN  Final   Special Requests   Final    BOTTLES DRAWN AEROBIC AND ANAEROBIC Blood Culture results may not be optimal due to an excessive volume of blood received in culture bottles   Culture   Final    NO GROWTH < 24 HOURS Performed at [facility], [address] [place], [place] [address]    Report Status PENDING  Incomplete  Culture, blood (Routine x 2)     Status: None (Preliminary result)   Collection Time: 07/20/18  2:06 PM  Result Value Ref Range Status   Specimen Description BLOOD LEFT FOREARM DRAWN BY RN  Final   Special Requests   Final    BOTTLES DRAWN AEROBIC AND ANAEROBIC Blood Culture results may not be optimal due to an excessive volume of blood received in culture bottles   Culture   Final    NO GROWTH < 24 HOURS Performed at [facility], [address] [place], [place] [address]    Report Status PENDING  Incomplete    Radiology  Reports Dg Chest Portable 1 View  Result Date: 07/20/2018 CLINICAL DATA:  Fever EXAM: PORTABLE CHEST 1 VIEW COMPARISON:  06/05/2018 FINDINGS: No focal consolidation or significant effusion. Normal cardiomediastinal silhouette with aortic atherosclerosis. No pneumothorax. IMPRESSION: No acute focal airspace disease. Electronically Signed   By: Donavan Foil M.D.   On: 07/20/2018 14:40     CBC Recent Labs  Lab 07/15/18 1152 07/20/18 1401 07/21/18 0512  WBC 1.9*  3.4* 4.7  HGB 10.1* 11.3* 10.3*  HCT 32.9* 35.9* 32.9*  PLT 235 386 375  MCV 97.9 93.5 95.4  MCH 30.1 29.4 29.9  MCHC 30.7 31.5 31.3  RDW 14.9 15.1 15.2  LYMPHSABS 1.1 0.8  --   MONOABS 0.2 0.8  --   EOSABS 0.0 0.0  --   BASOSABS 0.0 0.0  --     Chemistries  Recent Labs  Lab 07/15/18 1152 07/20/18 1401 07/21/18 0512  NA 136 130* 132*  K 4.0 4.6 3.9  CL 102 97* 99  CO2 25 21* 22  GLUCOSE 220* 148* 98  BUN 21 17 15   CREATININE 1.38* 1.17 1.19  CALCIUM 8.6* 8.8* 8.2*  AST  --  26  --   ALT  --  10  --   ALKPHOS  --  84  --   BILITOT  --  0.7  --    ------------------------------------------------------------------------------------------------------------------ No results for input(s): CHOL, HDL, LDLCALC, TRIG, CHOLHDL, LDLDIRECT in the last 72 hours.  Lab Results  Component Value Date   HGBA1C 6.6 (A) 03/08/2018   ------------------------------------------------------------------------------------------------------------------ No results for input(s): TSH, T4TOTAL, T3FREE, THYROIDAB in the last 72 hours.  Invalid input(s): FREET3 ------------------------------------------------------------------------------------------------------------------ No results for input(s): VITAMINB12, FOLATE, FERRITIN, TIBC, IRON, RETICCTPCT in the last 72 hours.  Coagulation profile Recent Labs  Lab 07/20/18 1401  INR 1.14    No results for input(s): DDIMER in the last 72 hours.  Cardiac Enzymes No results for  input(s): CKMB, TROPONINI, MYOGLOBIN in the last 168 hours.  Invalid input(s): CK ------------------------------------------------------------------------------------------------------------------    Component Value Date/Time   BNP 144.0 (H) 12/18/2017 1254     Roxan Hockey M.D on 07/21/2018 at 5:09 PM  Go to [email or web address] - for contact info  [facility] - Office  [phone]

## 2018-07-21 NOTE — Progress Notes (Addendum)
Initial Nutrition Assessment  DOCUMENTATION CODES:      INTERVENTION:  Recommend liberalize diet to regular   Chocolate- Ensure Enlive po BID, each supplement provides 350 kcal and 20 grams of protein    NUTRITION DIAGNOSIS:   Increased nutrient needs related to cancer and cancer related treatments as evidenced by estimated needs.  GOAL:   Patient will meet greater than or equal to 90% of their needs MONITOR:  PO intake, Supplement acceptance, Weight trends, Labs  REASON FOR ASSESSMENT:   Malnutrition Screening Tool    ASSESSMENT: patient is a 78 yo male with hx of HTN, DM, CAD, metastatic Merkel cell skin carcinoma-had first chemotherapy 3 weeks ago. Complains of increased weakness, nausea and loose stools past 2 days. Febrile on admission, hyponatremia.   Patient spouse passed away last week per daughter.   Meal intake: 50% today and daughter brought in gravy biscuit for him today. His appetite has been fair. Expect he is not meeting 75% of est needs outlined below. Able to feed himself. Home diet is regular.  He does like oral nutrition drinks and is willing to take Ensure while he is here. Recommend he continue at home BID (a high kcal/high protein choice).  Patient weight has decreased 2-3 kg over the past 2 months - not significant for timeframe. Usual range since last July 79-82 kg.    Medications reviewed and include: SSI, Vancomycin, zosyn, Morphine (15 mg) q12 hr.  Labs: BMP Latest Ref Rng & Units 07/21/2018 07/20/2018 07/15/2018  Glucose 70 - 99 mg/dL 98 148(H) 220(H)  BUN 8 - 23 mg/dL 15 17 21   Creatinine 0.61 - 1.24 mg/dL 1.19 1.17 1.38(H)  BUN/Creat Ratio 10 - 24 - - -  Sodium 135 - 145 mmol/L 132(L) 130(L) 136  Potassium 3.5 - 5.1 mmol/L 3.9 4.6 4.0  Chloride 98 - 111 mmol/L 99 97(L) 102  CO2 22 - 32 mmol/L 22 21(L) 25  Calcium 8.9 - 10.3 mg/dL 8.2(L) 8.8(L) 8.6(L)     NUTRITION - FOCUSED PHYSICAL EXAM:    Most Recent Value  Orbital Region  Moderate  depletion  Upper Arm Region  Mild depletion  Buccal Region  Mild depletion  Clavicle Bone Region  Moderate depletion  Scapular Bone Region  Mild depletion  Dorsal Hand  No depletion  Patellar Region  Mild depletion  Edema (RD Assessment)  None      Diet Order:   Diet Order            Diet heart healthy/carb modified Room service appropriate? Yes; Fluid consistency: Thin  Diet effective now              EDUCATION NEEDS:  No education needs have been identified at this time Skin:  Skin Assessment: Reviewed RN Assessment  Last BM:  2/17  Height:   Ht Readings from Last 1 Encounters:  07/20/18 6\' 1"  (1.854 m)    Weight:   Wt Readings from Last 1 Encounters:  07/20/18 79.1 kg    Ideal Body Weight:  86.3 kg  BMI:  Body mass index is 23.01 kg/m.  Estimated Nutritional Needs:   Kcal:  1610-9604 (for repletion/wt stability)  Protein:  103-111 (1.3-1.4 gr/kg/bw)  Fluid:  2000 liters daily   Colman Cater MS,RD,CSG,LDN Office: [phone] Pager: [phone]

## 2018-07-22 LAB — URINE CULTURE: Culture: NO GROWTH

## 2018-07-22 LAB — GLUCOSE, CAPILLARY
GLUCOSE-CAPILLARY: 125 mg/dL — AB (ref 70–99)
Glucose-Capillary: 173 mg/dL — ABNORMAL HIGH (ref 70–99)
Glucose-Capillary: 148 mg/dL — ABNORMAL HIGH (ref 70–99)
Glucose-Capillary: 104 mg/dL — ABNORMAL HIGH (ref 70–99)

## 2018-07-22 NOTE — Progress Notes (Signed)
Patient Demographics:    Jesse Cordova, is a 78 y.o. male, DOB - [birth year], PQZ:[id number]  Admit date - 07/20/2018   Admitting Physician Ejiroghene Arlyce Dice, MD  Outpatient Primary MD for the patient is [name], MD  LOS - 2   Chief Complaint  Patient presents with  . 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--- stable, get echo if positive blood cultures  4)DM2-A1c was 6.6 on 03/21/2018 reflecting excellent diabetic control, hold Jardiance, Lantus and Glucotrol, Use Novolog/Humalog Sliding scale insulin with Accu-Cheks/Fingersticks as ordered  5)H/o CAD --stable, no ACS type symptoms, continue  metoprolol 12.5 mg twice daily, continue pravastatin 20 mg daily, aspirin 81 mg daily   Code Status :  Full  Family Communication:    Daughter and granddaughter at bedside   Disposition Plan  :  Home with home health    Disposition/Need for in-Hospital Stay- patient unable to be discharged at this time due to SIRS (Fever, leukopenia and tachycardia) in a patient who is immunocompromised due to ongoing chemo treatments for skin cancer--- continue IV Vanco and Zosyn pending cultures  Code Status : Full  Family Communication:   Granddaughter and daughter at bedside  Disposition Plan  : TBD  Consults  :  na  DVT Prophylaxis  :  Lovenox -  Lab Results  Component Value Date   PLT 375 07/21/2018    Inpatient Medications  Scheduled Meds: . aspirin  81 mg Oral Daily  . enoxaparin (LOVENOX) injection  40 mg Subcutaneous Q24H  . feeding supplement (ENSURE ENLIVE)  237 mL Oral BID BM  . insulin aspart  0-5 Units Subcutaneous QHS  . metoprolol tartrate  12.5 mg Oral BID  . morphine  15 mg Oral Q12H  . pravastatin  20 mg Oral q1800   Continuous Infusions: . piperacillin-tazobactam (ZOSYN)  IV 3.375 g (07/22/18 1516)  . vancomycin 750 mg (07/22/18 1516)   PRN Meds:.acetaminophen **OR** acetaminophen, ondansetron **OR** ondansetron (ZOFRAN) IV, oxyCODONE    Anti-infectives (From admission, onward)   Start     Dose/Rate Route Frequency Ordered Stop   07/21/18 0300  vancomycin (VANCOCIN) IVPB 750 mg/150 ml premix     750 mg 150 mL/hr over 60 Minutes Intravenous  Every 12 hours 07/20/18 1716     07/20/18 2200  piperacillin-tazobactam (ZOSYN) IVPB 3.375 g     3.375 g 12.5 mL/hr over 240 Minutes Intravenous Every 8 hours 07/20/18 1716     07/20/18 1430  vancomycin (VANCOCIN) IVPB 1000 mg/200 mL premix     1,000 mg 200 mL/hr over 60 Minutes Intravenous  Once 07/20/18 1415 07/20/18 1613   07/20/18 1430  piperacillin-tazobactam (ZOSYN) IVPB 3.375 g     3.375 g 100 mL/hr over 30 Minutes Intravenous  Once 07/20/18 1415 07/20/18 1505        Objective:   Vitals:   07/21/18 0519  07/21/18 1728 07/21/18 2100 07/22/18 0508  BP: (!) 101/48 (!) 112/49 (!) 107/58 106/61  Pulse: (!) 108 100 (!) 101 93  Resp: 16 16 18 16   Temp: 100.3 F (37.9 C) 98.1 F (36.7 C) 98.8 F (37.1 C) 99.1 F (37.3 C)  TempSrc: Oral Oral Oral Oral  SpO2: 99% 100% 97% 96%  Weight:      Height:        Wt Readings from Last 3 Encounters:  07/20/18 79.1 kg  07/15/18 71.7 kg  06/05/18 79.3 kg     Intake/Output Summary (Last 24 hours) at 07/22/2018 1800 Last data filed at 07/22/2018 1300 Gross per 24 hour  Intake 1477.84 ml  Output 1800 ml  Net -322.16 ml     Physical Exam Patient is examined daily including today on 07/22/18 , exams remain the same as of yesterday except that has changed   Gen:- Awake Alert,  In no apparent distress  HEENT:- [place].AT, No sclera icterus Neck-Supple Neck,No JVD,.  Lungs-  CTAB , fair symmetrical air movement CV- S1, S2 normal, regular  Abd-  +ve B.Sounds, Abd Soft, No tenderness,    Extremity/Skin:- No  edema, pedal pulses present  Psych-affect is appropriate, oriented x3 Neuro-no new focal deficits, no tremors   Data Review:   Micro Results Recent Results (from the past 240 hour(s))  Culture, blood (Routine x 2)     Status: None (Preliminary result)   Collection Time: 07/20/18  2:01 PM  Result Value Ref Range Status   Specimen Description BLOOD RIGHT FOREARM DRAWN BY RN  Final   Special Requests   Final    BOTTLES DRAWN AEROBIC AND ANAEROBIC Blood Culture results may not be optimal due to an excessive volume of blood received in culture bottles   Culture   Final    NO GROWTH 2 DAYS Performed at [facility], [address] [place], [place] [address]    Report Status PENDING  Incomplete  Culture, Urine     Status: None   Collection Time: 07/20/18  2:01 PM  Result Value Ref Range Status   Specimen Description   Final    URINE, CLEAN CATCH Performed at [facility], [address], [place], [place] [address]    Special Requests   Final     NONE Performed at [facility], [address] [place], [place] [address]    Culture   Final    NO GROWTH Performed at [facility], [place] [address], [place], [place] [address]    Report Status 07/22/2018 FINAL  Final  Culture, blood (Routine x 2)     Status: None (Preliminary result)   Collection Time: 07/20/18  2:06 PM  Result Value Ref Range Status   Specimen Description BLOOD LEFT FOREARM DRAWN BY RN  Final   Special Requests   Final  BOTTLES DRAWN AEROBIC AND ANAEROBIC Blood Culture results may not be optimal due to an excessive volume of blood received in culture bottles   Culture   Final    NO GROWTH 2 DAYS Performed at [facility], [address], [place], [place] [address]    Report Status PENDING  Incomplete    Radiology Reports Dg Chest Portable 1 View  Result Date: 07/20/2018 CLINICAL DATA:  Fever EXAM: PORTABLE CHEST 1 VIEW COMPARISON:  06/05/2018 FINDINGS: No focal consolidation or significant effusion. Normal cardiomediastinal silhouette with aortic atherosclerosis. No pneumothorax. IMPRESSION: No acute focal airspace disease. Electronically Signed   By: Donavan Foil M.D.   On: 07/20/2018 14:40     CBC Recent Labs  Lab 07/20/18 1401 07/21/18 0512  WBC 3.4* 4.7  HGB 11.3* 10.3*  HCT 35.9* 32.9*  PLT 386 375  MCV 93.5 95.4  MCH 29.4 29.9  MCHC 31.5 31.3  RDW 15.1 15.2  LYMPHSABS 0.8  --   MONOABS 0.8  --   EOSABS 0.0  --   BASOSABS 0.0  --     Chemistries  Recent Labs  Lab 07/20/18 1401 07/21/18 0512  NA 130* 132*  K 4.6 3.9  CL 97* 99  CO2 21* 22  GLUCOSE 148* 98  BUN 17 15  CREATININE 1.17 1.19  CALCIUM 8.8* 8.2*  AST 26  --   ALT 10  --   ALKPHOS 84  --   BILITOT 0.7  --    ------------------------------------------------------------------------------------------------------------------ No results for input(s): CHOL, HDL, LDLCALC, TRIG, CHOLHDL, LDLDIRECT in the last 72 hours.  Lab Results  Component Value Date   HGBA1C 6.6  (A) 03/08/2018   ------------------------------------------------------------------------------------------------------------------ No results for input(s): TSH, T4TOTAL, T3FREE, THYROIDAB in the last 72 hours.  Invalid input(s): FREET3 ------------------------------------------------------------------------------------------------------------------ No results for input(s): VITAMINB12, FOLATE, FERRITIN, TIBC, IRON, RETICCTPCT in the last 72 hours.  Coagulation profile Recent Labs  Lab 07/20/18 1401  INR 1.14    No results for input(s): DDIMER in the last 72 hours.  Cardiac Enzymes No results for input(s): CKMB, TROPONINI, MYOGLOBIN in the last 168 hours.  Invalid input(s): CK ------------------------------------------------------------------------------------------------------------------    Component Value Date/Time   BNP 144.0 (H) 12/18/2017 1254     Roxan Hockey M.D on 07/22/2018 at 6:00 PM  Go to [email or web address] - for contact info  [facility] - Office  [phone]

## 2018-07-23 LAB — BASIC METABOLIC PANEL
ANION GAP: 12 (ref 5–15)
BUN: 14 mg/dL (ref 8–23)
CO2: 21 mmol/L — ABNORMAL LOW (ref 22–32)
Calcium: 8.5 mg/dL — ABNORMAL LOW (ref 8.9–10.3)
Chloride: 98 mmol/L (ref 98–111)
Creatinine, Ser: 0.98 mg/dL (ref 0.61–1.24)
GFR calc Af Amer: >60 mL/min (ref >60)
GFR calc non Af Amer: >60 mL/min (ref >60)
Glucose, Bld: 106 mg/dL — ABNORMAL HIGH (ref 70–99)
Potassium: 4 mmol/L (ref 3.5–5.1)
Sodium: 131 mmol/L — ABNORMAL LOW (ref 135–145)

## 2018-07-23 LAB — CBC
HEMATOCRIT: 30.6 % — AB (ref 39.0–52.0)
Hemoglobin: 9.5 g/dL — ABNORMAL LOW (ref 13.0–17.0)
MCH: 29.1 pg (ref 26.0–34.0)
MCHC: 31 g/dL (ref 30.0–36.0)
MCV: 93.6 fL (ref 80.0–100.0)
Platelets: 363 10*3/uL (ref 150–400)
RBC: 3.27 MIL/uL — ABNORMAL LOW (ref 4.22–5.81)
RDW: 15.1 % (ref 11.5–15.5)
WBC: 5.8 10*3/uL (ref 4.0–10.5)
nRBC: 0 % (ref 0.0–0.2)

## 2018-07-23 LAB — GLUCOSE, CAPILLARY
GLUCOSE-CAPILLARY: 106 mg/dL — AB (ref 70–99)
Glucose-Capillary: 154 mg/dL — ABNORMAL HIGH (ref 70–99)
Glucose-Capillary: 100 mg/dL — ABNORMAL HIGH (ref 70–99)

## 2018-07-23 MED ORDER — AMOXICILLIN-POT CLAVULANATE 875-125 MG PO TABS: 1.0000 | ORAL_TABLET | Freq: Two times a day (BID) | ORAL | 0 refills | Status: AC

## 2018-07-23 MED ORDER — INSULIN GLARGINE 100 UNIT/ML ~~LOC~~ SOLN: 8.0000 [IU] | Freq: Every day | SUBCUTANEOUS | 11 refills | Status: AC

## 2018-07-23 MED ORDER — ENSURE ENLIVE PO LIQD: 237.0000 mL | Freq: Two times a day (BID) | ORAL | 12 refills | Status: AC

## 2018-07-23 MED ORDER — HALOPERIDOL LACTATE 5 MG/ML IJ SOLN
5.0000 mg | Freq: Once | INTRAMUSCULAR | Status: AC
  Administered 2018-07-23: 5 mg via INTRAMUSCULAR
  Filled 2018-07-23: qty 1

## 2018-07-23 NOTE — Plan of Care (Signed)
  Problem: Acute Rehab PT Goals(only PT should resolve) Goal: Pt Will Go Supine/Side To Sit Outcome: Progressing Flowsheets (Taken 07/23/2018 1238) Pt will go Supine/Side to Sit: Independently Goal: Patient Will Transfer Sit To/From Stand Outcome: Progressing Flowsheets (Taken 07/23/2018 1238) Patient will transfer sit to/from stand: with modified independence Note:  With RW Goal: Pt Will Transfer Bed To Chair/Chair To Bed Outcome: Progressing Flowsheets (Taken 07/23/2018 1238) Pt will Transfer Bed to Chair/Chair to Bed: with modified independence Note:  With RW Goal: Pt Will Ambulate Outcome: Progressing Flowsheets (Taken 07/23/2018 1238) Pt will Ambulate: > 125 feet; with min guard assist; with rolling walker   12:39 PM, 07/23/18 Talbot Grumbling, DPT Physical Therapist with [facility] [phone] office

## 2018-07-23 NOTE — Care Management Note (Signed)
Case Management Note  Patient Details  Name: Jesse Cordova MRN: [id number] Date of Birth: [birth year]  Subjective/Objective:    Admitted with sepsis. From home, strong family support. Spouse at bedside. Daughter POA. Pt active with [facility] pta. PT recommends HH PT. Family wishes to continue services through [facility]. Aware HH has 48 hrs to resume.                 Action/Plan: DC home today. [facility] rep aware of DC.   Expected Discharge Date:  07/22/18               Expected Discharge Plan:  [place]  In-House Referral:  NA  Discharge planning Services  CM Consult  Post Acute Care Choice:  Home Health, Resumption of Svcs/PTA Provider Choice offered to:  Spouse, Adult Children  DME Arranged:    DME Agency:     HH Arranged:  RN, PT [place] Agency:  [place]  Status of Service:  Completed, signed off  If discussed at [place] of Stay Meetings, dates discussed:    Additional Comments:  Sherald Barge, RN 07/23/2018, 12:34 PM

## 2018-07-23 NOTE — Discharge Instructions (Signed)
1) take medications including Augmentin as prescribed 2) follow-up with your oncologist within the next  5 days 3) stop glipizide 4) please decrease Lantus insulin to 8 units at bedtime from your previous dose of 20 units 5) check your blood sugar 3 times before each meal and at bedtime--- keep a record/diary of your blood suga

## 2018-07-23 NOTE — Progress Notes (Signed)
DR. Joesph Fillers by bedside. Pt evaluated. Pt appropriate but disoriented. DC instructions given to [name] the granddaughter. IV discontinued. Encouraged to return if pt unstable or with concerns.

## 2018-07-23 NOTE — Progress Notes (Signed)
Dr  Joesph Fillers at bedside. Order for Haldol 5mg  IM to be given prior to dc. Pt aware. Granddaughter at bedside.

## 2018-07-23 NOTE — Discharge Summary (Addendum)
Jesse Cordova, is a 78 y.o. male  DOB 08-17-1940  MRN [id number].  Admission date:  07/20/2018  Admitting Physician  Bethena Roys, MD  Discharge Date:  07/23/2018   Primary MD  [name], MD  Recommendations for primary care physician for things to follow:   1) take medications including Augmentin as prescribed 2) follow-up with your oncologist within the next  5 days 3) stop glipizide 4) please decrease Lantus insulin to 8 units at bedtime from your previous dose of 20 units 5) check your blood sugar 3 times before each meal and at bedtime--- keep a record/diary of your blood suga  Admission Diagnosis  Febrile illness, acute [R50.9]   Discharge Diagnosis  Febrile illness, acute [R50.9]    Active Problems:   Sepsis [facility])    Past Medical History:  Diagnosis Date  . 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 Arthritis   . Cancer ([place])    skin cancer  . Chronic low back pain   . Complication of anesthesia   . Diabetes mellitus   . GERD (gastroesophageal reflux disease)   . History of hip surgery 06/02/2018   right hip, right knee  . Hyperlipemia   . Hypertension   . Merkel cell carcinoma of face ([place]) 02/18/2016    Followed by [place], underwent surgery now undergoing radiation September 2017  . PONV (postoperative nausea and vomiting)   . 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 BIOPSY  01/16/2012   Procedure: BIOPSY;  Surgeon: Rogene Houston, MD;  [facility]: [facility];  Service: Endoscopy;  Laterality: N/A;  . CATARACT EXTRACTION Right 7/06  . CATARACT EXTRACTION W/PHACO Left 01/05/2014   Procedure: CATARACT EXTRACTION PHACO AND INTRAOCULAR LENS PLACEMENT (IOC);  Surgeon: Tonny Branch, MD;  Location: [facility]  ORS;  Service: Ophthalmology;  Laterality: Left;  CDE:15.98  . COLONOSCOPY  01/29/12   abnormal repeat in one year  . ESOPHAGOGASTRODUODENOSCOPY    . KNEE ARTHROSCOPY     2 right knee surgeries and 1 left knee surgery  . RIGHT/LEFT HEART CATH AND CORONARY ANGIOGRAPHY N/A 11/25/2017   Procedure: RIGHT/LEFT HEART CATH AND CORONARY ANGIOGRAPHY;  Surgeon: Burnell Blanks, MD;  Location: [place] CV LAB;  Service: Cardiovascular;  Laterality: N/A;  . TEE WITHOUT CARDIOVERSION N/A 12/22/2017   Procedure: TRANSESOPHAGEAL ECHOCARDIOGRAM (TEE);  Surgeon: Burnell Blanks, MD;  Location: [place];  Service: Open Heart Surgery;  Laterality: N/A;  . TONSILLECTOMY    . TRANSCATHETER AORTIC VALVE REPLACEMENT, TRANSFEMORAL N/A 12/22/2017   Procedure: TRANSCATHETER AORTIC VALVE REPLACEMENT, TRANSFEMORAL;  Surgeon: Burnell Blanks, MD;  Location: [place];  Service: Open Heart Surgery;  Laterality: N/A;  . VASECTOMY       HPI  from the history and physical done on the day of admission:    Chief Complaint: Generalized weakness  HPI:  Jesse Cordova is a 79 y.o. 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  Apart from reports of patient wanting to talk it or drink, daughters deny confusion or disorientation.  Patient reports nausea, and 2 episodes of loose stools daily for the past 2 days, none today , no vomiting.  No abdominal pain.  No difficulty breathing or cough.  No dysuria.  No headaches or neck stiffness.  No chest pain.  No ulcers or wounds.  No recent dental work-up.  Patient has had poor p.o. intake over the past few weeks.  Blood sugar this morning was 60 at home.  Patient recently was brought to [facility] for femoral neck fracture, after a mechanical fall but  there was concern for pathologic fracture 06/2018.  Patient completed short course of Lovenox for DVT prophylaxis.  ED Course: febrile 101.6, tachycardic to 121, WBC 3.4.  Normal lactic acid 1.  UA no bacteria, 0-5 WBC, ketones positive.  Chest x-ray negative for acute abnormality.  Sinus tachycardia.  Influenza panel negative.  Patient was given 2 L bolus, blood cultures obtained and started on broad-spectrum antibiotics - vancomycin and Zosyn, hospitalist to admit for sepsis.    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 admitted on 07/20/2018 with fevers up to 101.6, tachycardia with heart rate of 121 leukopenia with WBC of 3.4 without any focus of infection, on Vanco and Zosyn pending cultures  Plan:- 1)SIRS----post-chemotherapy patient with concerns about leukopenic fevers , SIRS pathophysiology including fevers and leukopenia resolved ,   patient never had elevated lactic acid levels, chest x-ray negative, blood cultures remain negative patient was treated with Vanco and Zosyn will discharge with Augmentin for additional 4 days  2)Merkel cell carcinoma of the face--- patient apparently has pretty high disease burden, undergoing chemo at [facility] hematology oncology department, last chemo treatment 1/30/ 2020, plans are for immunotherapy starting 07/23/2018-----,  leukopenia is resolved  3)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--- stable, no need for echo as blood cultures are negative  4)DM2-A1c was 6.6 on 03/21/2018 reflecting excellent diabetic control,  restart Jardiance, stop glipizide, decrease Lantus to 8 units every afternoon to avoid hypoglycemia    5)H/o CAD --stable, no ACS type symptoms.,  Continue metoprolol 12.5 mg twice daily, continue pravastatin 20 mg daily, aspirin 81 mg daily   Code Status : Full  Family Communication:    Daughter and granddaughter at  bedside   Disposition Plan  :  Home with home health  Discharge Condition: stable  Follow UP--- oncologist   Diet and Activity recommendation:  As advised  Discharge Instructions    Discharge Instructions    Call MD for:  difficulty breathing, headache or visual disturbances   Complete by:  As directed    Call MD for:  persistant dizziness or light-headedness   Complete by:  As directed    Call MD for:  persistant nausea and vomiting   Complete by:  As directed    Call MD for:  severe uncontrolled pain   Complete by:  As directed    Call MD for:  temperature >100.4   Complete by:  As directed    Diet - low sodium heart healthy   Complete by:  As directed    Discharge instructions   Complete by:  As directed    1) take medications including Augmentin as prescribed 2) follow-up with your oncologist within the next  5 days 3) stop glipizide 4) please decrease Lantus insulin to 8 units at bedtime from your previous dose of 20 units 5) check your blood sugar 3 times before each meal and at bedtime--- keep a record/diary of your blood sugars   Increase activity slowly   Complete by:  As directed        Discharge Medications     Allergies as of 07/23/2018      Reactions   Cortisone Other (See Comments)   swelling   Tetanus Toxoids Other (See Comments)   Swelling, not specified   Hydrocodone-homatropine Nausea And Vomiting      Medication List    STOP taking these medications   enoxaparin 40 MG/0.4ML injection Commonly known as:  LOVENOX   glipiZIDE 5 MG tablet Commonly known as:  GLUCOTROL     TAKE these medications   acetaminophen 500 MG tablet Commonly known as:  TYLENOL Take 500 mg by mouth every 4 (four) hours as needed for mild pain or moderate pain.   amoxicillin-clavulanate 875-125 MG tablet Commonly known as:  AUGMENTIN Take 1 tablet by mouth 2 (two) times daily for 4 days.   DIALYVITE VITAMIN D 5000 125 MCG (5000 UT) capsule Generic drug:   Cholecalciferol Take 5,000 Units by mouth daily.   feeding supplement (ENSURE ENLIVE) Liqd Take 237 mLs by mouth 2 (two) times daily between meals. Start taking on:  July 24, 2018   insulin glargine 100 UNIT/ML injection Commonly known as:  LANTUS Inject 0.08 mLs (8 Units total) into the skin at bedtime. What changed:  how much to take   JARDIANCE 25 MG Tabs tablet Generic drug:  empagliflozin Take 25 mg by mouth daily.   lovastatin 20 MG tablet Commonly known as:  MEVACOR Take 20 mg by mouth every evening.   metoprolol succinate 25 MG 24 hr tablet Commonly known as:  TOPROL-XL Take 12.5 mg by mouth daily.   morphine 15 MG 12 hr tablet Commonly known as:  MS CONTIN Take 15 mg by mouth every 12 (twelve) hours as needed. For pain   ondansetron 8 MG tablet Commonly known as:  ZOFRAN Take 8 mg by mouth every 8 (eight) hours as needed for nausea or vomiting.   Oxycodone HCl 10 MG Tabs 1 every 4 hours as needed severe pain caution drowsiness metastatic cancer What changed:    how much to take  how to take this  when to take this  reasons to take this   pantoprazole 40 MG tablet Commonly known as:  PROTONIX Take 40 mg by mouth daily.   polyethylene glycol packet Commonly known as:  MIRALAX / GLYCOLAX Take 17 g by mouth 2 (two) times daily.   prochlorperazine 10 MG tablet Commonly known as:  COMPAZINE Take 10 mg by mouth every 6 (six) hours as needed for nausea or vomiting.       Major procedures and Radiology Reports - PLEASE review detailed and final reports for all details, in brief -   Dg Chest Portable 1 View  Result Date: 07/20/2018 CLINICAL DATA:  Fever EXAM: PORTABLE CHEST 1 VIEW COMPARISON:  06/05/2018 FINDINGS: No focal consolidation or significant effusion. Normal cardiomediastinal silhouette with aortic atherosclerosis. No pneumothorax. IMPRESSION: No acute focal airspace disease. Electronically Signed   By: Madie Reno.D.  On: 07/20/2018  14:40    Micro Results   Recent Results (from the past 240 hour(s))  Culture, blood (Routine x 2)     Status: None (Preliminary result)   Collection Time: 07/20/18  2:01 PM  Result Value Ref Range Status   Specimen Description BLOOD RIGHT FOREARM DRAWN BY RN  Final   Special Requests   Final    BOTTLES DRAWN AEROBIC AND ANAEROBIC Blood Culture results may not be optimal due to an excessive volume of blood received in culture bottles   Culture   Final    NO GROWTH 3 DAYS Performed at [facility], [address] [place], [place] [address]    Report Status PENDING  Incomplete  Culture, Urine     Status: None   Collection Time: 07/20/18  2:01 PM  Result Value Ref Range Status   Specimen Description   Final    URINE, CLEAN CATCH Performed at [facility], [address] [place], [place] [address]    Special Requests   Final    NONE Performed at [facility], [address], [place], [place] [address]    Culture   Final    NO GROWTH Performed at [facility], [place] [address] [place], [place] [address]    Report Status 07/22/2018 FINAL  Final  Culture, blood (Routine x 2)     Status: None (Preliminary result)   Collection Time: 07/20/18  2:06 PM  Result Value Ref Range Status   Specimen Description BLOOD LEFT FOREARM DRAWN BY RN  Final   Special Requests   Final    BOTTLES DRAWN AEROBIC AND ANAEROBIC Blood Culture results may not be optimal due to an excessive volume of blood received in culture bottles   Culture   Final    NO GROWTH 3 DAYS Performed at [facility], [address] [place], [place] [address]    Report Status PENDING  Incomplete    Today   Subjective    [name] today has no new complaints, No fever  Or chills ,  No Nausea, Vomiting or Diarrhea   Patient has been seen and examined prior to discharge   Objective   Blood pressure 119/65, pulse 91, temperature 98.6 F (37 C), temperature source Oral, resp. rate 16, height 6\' 1"  (1.854 m),  weight 79.1 kg, SpO2 97 %.   Intake/Output Summary (Last 24 hours) at 07/23/2018 1740 Last data filed at 07/23/2018 0603 Gross per 24 hour  Intake 557.39 ml  Output 1600 ml  Net -1042.61 ml    Exam Gen:- Awake Alert,  In no apparent distress  HEENT:- [place].AT, No sclera icterus Neck-Supple Neck,No JVD,.  Lungs-  CTAB , fair symmetrical air movement CV- S1, S2 normal, regular  Abd-  +ve B.Sounds, Abd Soft, No tenderness,    Extremity/Skin:- No  edema, pedal pulses present  Psych-affect is appropriate, oriented x3 Neuro-no new focal deficits, no tremors   Data Review   CBC w Diff:  Lab Results  Component Value Date   WBC 5.8 07/23/2018   HGB 9.5 (L) 07/23/2018   HGB 15.6 12/30/2017   HCT 30.6 (L) 07/23/2018   HCT 46.9 12/30/2017   PLT 363 07/23/2018   PLT 244 12/30/2017   LYMPHOPCT 25 07/20/2018   MONOPCT 25 07/20/2018   EOSPCT 0 07/20/2018   BASOPCT 1 07/20/2018    CMP:  Lab Results  Component Value Date   NA 131 (L) 07/23/2018   NA 140 01/27/2018  K 4.0 07/23/2018   CL 98 07/23/2018   CO2 21 (L) 07/23/2018   BUN 14 07/23/2018   BUN 18 01/27/2018   CREATININE 0.98 07/23/2018   CREATININE 1.25 (H) 02/20/2016   PROT 7.2 07/20/2018   PROT 7.2 08/31/2017   ALBUMIN 3.5 07/20/2018   ALBUMIN 4.6 08/31/2017   BILITOT 0.7 07/20/2018   BILITOT 0.5 08/31/2017   ALKPHOS 84 07/20/2018   AST 26 07/20/2018   ALT 10 07/20/2018   Total Discharge time is about 33 minutes  Roxan Hockey M.D on 07/23/2018 at 5:40 PM  Go to [email or web address] -  for contact info  [facility] - Office  [phone]

## 2018-07-23 NOTE — Evaluation (Signed)
Physical Therapy Evaluation Patient Details Name: Jesse Cordova MRN: [id number] DOB: 09-18-1940 Today's Date: 07/23/2018   History of Present Illness  Jesse Cordova is a 78 y.o. 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  Apart from reports of patient wanting to talk it or drink, daughters deny confusion or disorientation.  Patient reports nausea, and 2 episodes of loose stools daily for the past 2 days, none today , no vomiting.  No abdominal pain.  No difficulty breathing or cough.  No dysuria.  No headaches or neck stiffness.  No chest pain.  No ulcers or wounds.  No recent dental work-up.  Patient has had poor p.o. intake over the past few weeks.  Blood sugar this morning was 60 at home.    Clinical Impression  Patient presents supine in bed and is agreeable to therapy. Patient near baseline for functional mobility and gait, slightly limited secondary to generalized weakness and fatigue with activity. Pt demonstrates initial unsteadiness upon standing, but resolves with time. Pt with 1 loss of balance during gait training with turning, but able to recover without physical assistance, demonstrates narrow BOS, decreased cadence, and decreased R single limb stance time secondary to pain complaints. Pt with mild labored movement requiring increased time for activities. Patient left up in chair with bil LE elevated, chair alarm on and with call bell in lap. Patient will benefit from continued physical therapy in hospital and recommendations below to increase strength, balance, endurance for safe ADLs and gait.     Follow Up Recommendations Home health PT;Supervision - Intermittent    Equipment Recommendations  None recommended by PT    Recommendations for Other  Services       Precautions / Restrictions Precautions Precautions: Fall Restrictions Weight Bearing Restrictions: No      Mobility  Bed Mobility Overal bed mobility: Modified Independent             General bed mobility comments: increased time  Transfers Overall transfer level: Needs assistance Equipment used: Rolling walker (2 wheeled) Transfers: Sit to/from [facility] Sit to Stand: Min guard Stand pivot transfers: Min guard       General transfer comment: initial unsteadiness upon standing resolving with time  Ambulation/Gait Ambulation/Gait assistance: Min guard Gait Distance (Feet): 80 Feet Assistive device: Rolling walker (2 wheeled) Gait Pattern/deviations: Decreased step length - right;Decreased step length - left;Decreased stride length;Narrow base of support Gait velocity: decreased   General Gait Details: Pt with decreased cadence, narrow BOS, occasional touching of medial feet, 1 loss of balance with turning but able to recover without physical assistance, limited secondary to c/o pain  Stairs            Wheelchair Mobility    Modified Rankin (Stroke Patients Only)       Balance Overall balance assessment: Needs assistance Sitting-balance support: Feet supported;No upper extremity supported Sitting balance-Leahy Scale: Good Sitting balance - Comments: seated EOB   Standing balance support: During functional activity;Bilateral upper extremity supported Standing balance-Leahy Scale: Fair Standing balance comment: with RW                             Pertinent Vitals/Pain Pain Assessment: 0-10 Pain Score: 2  Pain Location: low  back, R hip, R knee Pain Descriptors / Indicators: Aching;Sore Pain Intervention(s): Limited activity within patient's tolerance;Monitored during session;Repositioned    Home Living Family/patient expects to be discharged to:: Private residence Living Arrangements:  Alone;Children Available Help at Discharge: Family(Pt's daughter reports someone has been living with the pt since pt's spouse passed away) Type of Home: House Home Access: Ramped entrance     Home Layout: Multi-level;Able to live on main level with bedroom/bathroom Home Equipment: Gilford Rile - 2 wheels;Wheelchair - manual;Cane - single point;Bedside commode;Walker - 4 wheels      Prior Function Level of Independence: Needs assistance;Independent with assistive device(s)   Gait / Transfers Assistance Needed: Pt reports being Ind with transfers and ambulating household distances with RW  ADL's / Homemaking Assistance Needed: Pt reports family provides driving to community, family provides meals, and Ind with self care tasks  Comments: Pt reports just completing HH PT recently from R hip surgery in January 2020     Hand Dominance        Extremity/Trunk Assessment   Upper Extremity Assessment Upper Extremity Assessment: Overall WFL for tasks assessed    Lower Extremity Assessment Lower Extremity Assessment: Generalized weakness;RLE deficits/detail;LLE deficits/detail RLE Deficits / Details: grossly 3/5 secondary to pain complaints RLE Sensation: decreased light touch(Pt reports "decreased feeling" throughout R LE) RLE Coordination: WNL LLE Deficits / Details: grossly 3+/5 LLE Sensation: WNL LLE Coordination: WNL    Cervical / Trunk Assessment Cervical / Trunk Assessment: Normal  Communication   Communication: No difficulties  Cognition Arousal/Alertness: Awake/alert Behavior During Therapy: WFL for tasks assessed/performed Overall Cognitive Status: Within Functional Limits for tasks assessed                                        General Comments      Exercises     Assessment/Plan    PT Assessment Patient needs continued PT services  PT Problem List Decreased strength;Decreased activity tolerance;Decreased balance;Decreased mobility       PT  Treatment Interventions Gait training;Functional mobility training;Therapeutic activities;Therapeutic exercise;Balance training;Patient/family education    PT Goals (Current goals can be found in the Care Plan section)  Acute Rehab PT Goals Patient Stated Goal: return home with family support PT Goal Formulation: With patient/family Time For Goal Achievement: 07/30/18 Potential to Achieve Goals: Good    Frequency Min 2X/week   Barriers to discharge        Co-evaluation               AM-PAC PT "6 Clicks" Mobility  Outcome Measure Help needed turning from your back to your side while in a flat bed without using bedrails?: None Help needed moving from lying on your back to sitting on the side of a flat bed without using bedrails?: None Help needed moving to and from a bed to a chair (including a wheelchair)?: A Little Help needed standing up from a chair using your arms (e.g., wheelchair or bedside chair)?: A Little Help needed to walk in hospital room?: A Little Help needed climbing 3-5 steps with a railing? : A Little 6 Click Score: 20    End of Session Equipment Utilized During Treatment: Gait belt Activity Tolerance: Patient tolerated treatment well;Patient limited by fatigue;Patient limited by pain Patient left: in chair;with call bell/phone within reach;with chair alarm set Nurse Communication: Mobility status PT Visit Diagnosis: Unsteadiness on feet (R26.81);Other abnormalities of gait and  mobility (R26.89);Muscle weakness (generalized) (M62.81)    Time: 4801-6553 PT Time Calculation (min) (ACUTE ONLY): 30 min   Charges:   PT Evaluation $PT Eval Moderate Complexity: 1 Mod PT Treatments $Gait Training: 8-22 mins $Therapeutic Activity: 8-22 mins        12:37 PM, 07/23/18 Talbot Grumbling, DPT Physical Therapist with [facility] [phone] office

## 2018-07-25 LAB — CULTURE, BLOOD (ROUTINE X 2)
Culture: NO GROWTH
Culture: NO GROWTH

## 2018-07-26 ENCOUNTER — Telehealth: Payer: Self-pay | Admitting: Family Medicine

## 2018-07-26 NOTE — Telephone Encounter (Signed)
Granddaughter Erline Levine is aware and will meet you there. Girls up front aware to place on the schedule for Wednesday at [place].

## 2018-07-26 NOTE — Telephone Encounter (Signed)
Please advise 

## 2018-07-26 NOTE — Telephone Encounter (Signed)
Home visit Wednesday 5 PM on the schedule Let family know that I will be there somewhere between 73 and 6 PM if that works for them  If that does not work for them let me know

## 2018-07-26 NOTE — Telephone Encounter (Signed)
May have verbal order for physical therapy thank you

## 2018-07-26 NOTE — Telephone Encounter (Signed)
Requesting verbal order  Skilled nursing 2 times a week for 3 weeks social worker order  Order for IV fluid as needed  also if wanting labs drawn let [facility] know.

## 2018-07-26 NOTE — Telephone Encounter (Signed)
Requesting Verbal for physical therapy  Physical therapy twice a week for 3 weeks

## 2018-07-26 NOTE — Telephone Encounter (Signed)
Verbal orders given to [facility] at [facility]

## 2018-07-26 NOTE — Telephone Encounter (Signed)
Patient daughter Jesse Cordova is wanting you to come out to do home visit either this week or next week.He just got out of hospital on 2/18.

## 2018-07-26 NOTE — Telephone Encounter (Signed)
Left a message for [facility] with [facility].

## 2018-07-26 NOTE — Telephone Encounter (Signed)
Please go ahead and give verbal orders regarding these thank you

## 2018-07-26 NOTE — Telephone Encounter (Signed)
Jesse Cordova is aware. 

## 2018-07-28 ENCOUNTER — Telehealth: Payer: Self-pay | Admitting: Family Medicine

## 2018-07-28 NOTE — Telephone Encounter (Signed)
Nurses I was under the impression that I was doing a home visit on this patient My impression was it was supposed to be today Please try to verify with granddaughter see if possible if I can be there between 5 and 515 if that would work for family?

## 2018-07-28 NOTE — Telephone Encounter (Signed)
[place] daughter states pt [place] last night and hit hip. Pt is currently at [facility] seeing ortho. Grand daughter states she will call around 4 pm or so to say yes or no to come. Should be back home by 5 or so but  [place] daughter states she will not be there until about 5:30 pm

## 2018-07-29 NOTE — Telephone Encounter (Signed)
Patient granddaughter is aware of all and is ok with 5:30 on August 04, 2018. Audrea Muscat placed on the schedule.

## 2018-07-29 NOTE — Telephone Encounter (Signed)
They were unable to arrange for a home visit because he was in [place] seen specialist Please set up for home visit next Tuesday or Wednesday at 5 PM-impracticality I will be able to go to the patient's home in the evening somewhere between 5:15 and 530  If those dates do not suit for their schedule please find out what dates might I am unable to go for a home visit Thursday or Friday night of this week

## 2018-07-29 NOTE — Telephone Encounter (Signed)
Thank you :)

## 2018-08-04 ENCOUNTER — Ambulatory Visit: Payer: Medicare Other | Admitting: Family Medicine

## 2018-08-04 DIAGNOSIS — R634 Abnormal weight loss: Secondary | ICD-10-CM | POA: Diagnosis not present

## 2018-08-04 DIAGNOSIS — C7B1 Secondary Merkel cell carcinoma: Secondary | ICD-10-CM | POA: Diagnosis not present

## 2018-08-04 NOTE — Progress Notes (Signed)
Home visit Patient is homebound Has metastatic cancer He is fighting his best He relates right knee pain States he also had a fall the other day had a hairline fracture of the right hip Also was hospitalized because of dehydration as well as low glucose they stop glipizide He is currently on multiple medications including the Jardiance.  Blood pressure 114/72 lungs clear no crackles no respiratory distress heart regular no murmurs pulses normal extremities trace edema skin warm dry neurologic grossly normal  Patient with metastatic cancer long-term outlook is subpar Concerning for the possibility of developing cachexia weakness and increased falls for now he will do the best he can moving forward  I recommend stopping the Jardiance because I am concerned of him not drinking enough liquids and ending up having kidney dysfunction or failure with this  We will see the patient back again in approximately 4 weeks

## 2018-08-05 ENCOUNTER — Telehealth: Payer: Self-pay | Admitting: Family Medicine

## 2018-08-05 NOTE — Telephone Encounter (Signed)
Please send in fanny cream, 8 ounces, apply twice daily, if any ongoing troubles let us know, make sure home health checks this area next week when they go to see him

## 2018-08-05 NOTE — Telephone Encounter (Signed)
So noted 

## 2018-08-05 NOTE — Telephone Encounter (Signed)
I contacted Erline Levine to make her aware of appt that has been made for April 1st.   She wanted to ask Dr. Nicki Reaper if the fanny cream at [facility] would be good for Mr. Worley to use. He has skin break down on both sides of his bottom from sitting in the chair all the time. She wanted to know if that cream would be good or if another one can be recommended.   If Dr. Nicki Reaper agree with fanny cream please call in for them.   Erline Levine said if she is unable to answer please leave a voicemail letting her know.   CB# [phone]

## 2018-08-05 NOTE — Telephone Encounter (Signed)
Informational only: Mr.Broad is missing one physical therapy visit today due to chemo treatment.

## 2018-08-05 NOTE — Telephone Encounter (Signed)
Prescription sent electronically to pharmacy. Granddaughter (DPR) notified.

## 2018-08-09 ENCOUNTER — Telehealth: Payer: Self-pay | Admitting: Family Medicine

## 2018-08-09 MED ORDER — NYSTATIN 100000 UNIT/ML MT SUSP: OROMUCOSAL | 0 refills | Status: AC

## 2018-08-09 MED ORDER — FLUCONAZOLE 200 MG PO TABS: 200.0000 mg | ORAL_TABLET | Freq: Every day | ORAL | 0 refills | Status: DC

## 2018-08-09 NOTE — Telephone Encounter (Signed)
Colletta Maryland contacted and verbal ok given for condom cath at bedtime, UA, and to apply zinc cream to bottom.

## 2018-08-09 NOTE — Telephone Encounter (Signed)
[facility]) called stating patient had thrush and no appetite and wanting something called in for both to [facility]. [phone]

## 2018-08-09 NOTE — Telephone Encounter (Signed)
Please advise 

## 2018-08-09 NOTE — Telephone Encounter (Signed)
It is fine to go ahead with all of these  request

## 2018-08-09 NOTE — Telephone Encounter (Signed)
I recommend Diflucan 200 mg 1 daily for 7 days May also do nystatin oral solution 1 teaspoon swish and swallow 4 times daily for 7 days

## 2018-08-09 NOTE — Telephone Encounter (Signed)
   [facility] wants order for condom cathe for use QHS.Having burning with urination wants to know if ok to do UA. Wants to know if they can apply zinc cream to bottom.(already has at home.  Colletta Maryland is aware medication sent to requested pharmacy.

## 2018-08-10 ENCOUNTER — Telehealth: Payer: Self-pay | Admitting: Family Medicine

## 2018-08-10 NOTE — Telephone Encounter (Signed)
Spoke with [name] and she states that patient is taking Zofran 8 mg every 8 hours scheduled and grand daughter added Compazine 10 mg also. Pt has stopped the Jardiance. [place] daughter will keep [place] updated. Pt grand daughter verbalized understanding.

## 2018-08-10 NOTE — Telephone Encounter (Signed)
GLUCOSE READINGS  08/04/18 AM: 139 PM:217  08/05/18 AM:123 PM: 230  08/06/18 AM: 192 PM: 214  08/07/18 AM: 156  08/08/18 AM: 187 PM: 279  08/09/18 AM: 157  08/10/18 AM: 172  Granddaughter states Jesse Cordova started not feeling well last night, vomiting, and did not take insulin or want to check glucose.

## 2018-08-10 NOTE — Telephone Encounter (Signed)
Pt is not eating, pt threw up Ensure that granddaughter made him drink. Round of chemo Thursday and Friday. Pt is nauseous and feeling lousy. Urinated a lot  and he is not drinking a lot. Going about every hour to two hours. No insulin given last night due to vomited. Vomited x1. Pt granddaughter is going to call oncologist and inform them of what is going on also.

## 2018-08-10 NOTE — Telephone Encounter (Signed)
Is patient taking Zofran? If this continues he may need to go to ER to get IV fluids and nausea medicine Please double check with [name]- has patient stopped Jardiance?  If so patient needs to be off of Jardiance I agree with holding insulin as long as the glucoses are under 200 Keep [place] updated

## 2018-08-11 ENCOUNTER — Telehealth: Payer: Self-pay | Admitting: Family Medicine

## 2018-08-11 ENCOUNTER — Ambulatory Visit: Payer: Medicare Other | Admitting: Cardiovascular Disease

## 2018-08-11 NOTE — Telephone Encounter (Signed)
That would be fine 

## 2018-08-11 NOTE — Telephone Encounter (Signed)
Please advise. Thank you

## 2018-08-11 NOTE — Telephone Encounter (Signed)
Contacted [facility] and informed that Dr.Scott states that it would be fine for skilled nursing 2x for 2 weeks.

## 2018-08-11 NOTE — Telephone Encounter (Signed)
Jesse Cordova with [facility] is calling requesting skilled nursing 2x 2 weeks. She is going to see pt today and do lab work, urine culture and to give him IV fluids for dehydration.   CB# [phone]

## 2018-08-12 ENCOUNTER — Telehealth: Payer: Self-pay | Admitting: *Deleted

## 2018-08-12 DIAGNOSIS — I5189 Other ill-defined heart diseases: Secondary | ICD-10-CM

## 2018-08-12 NOTE — Telephone Encounter (Signed)
Spoke with patient Jesse Cordova, ok per DPR. Patient was not able to come to follow up this week secondary to not feeling well from chemo. She requested Dr Oval Linsey review patients PET scan regarding heart. Reviewed by Dr Oval Linsey and Echo recommended. [name] and she requested be done at [facility]. Order placed and message sent to scheduling.

## 2018-08-13 ENCOUNTER — Telehealth: Payer: Self-pay | Admitting: Family Medicine

## 2018-08-13 NOTE — Telephone Encounter (Signed)
[facility] requesting verbal for extension on physical therapy two times a week for 2 weeks.

## 2018-08-13 NOTE — Telephone Encounter (Signed)
Please advise. Thank you

## 2018-08-14 ENCOUNTER — Encounter (HOSPITAL_COMMUNITY): Payer: Self-pay | Admitting: *Deleted

## 2018-08-14 ENCOUNTER — Emergency Department (HOSPITAL_COMMUNITY): Payer: Medicare Other

## 2018-08-14 ENCOUNTER — Other Ambulatory Visit: Payer: Self-pay

## 2018-08-14 ENCOUNTER — Inpatient Hospital Stay (HOSPITAL_COMMUNITY)
Admission: EM | Admit: 2018-08-14 | Discharge: 2018-08-17 | DRG: 872 | Disposition: A | Discharge status: Home Health | Payer: Medicare Other | Attending: Internal Medicine | Admitting: Internal Medicine

## 2018-08-14 DIAGNOSIS — I2510 Atherosclerotic heart disease of native coronary artery without angina pectoris: Secondary | ICD-10-CM | POA: Diagnosis present

## 2018-08-14 DIAGNOSIS — Z8249 Family history of ischemic heart disease and other diseases of the circulatory system: Secondary | ICD-10-CM

## 2018-08-14 DIAGNOSIS — E1122 Type 2 diabetes mellitus with diabetic chronic kidney disease: Secondary | ICD-10-CM | POA: Diagnosis present

## 2018-08-14 DIAGNOSIS — Z885 Allergy status to narcotic agent status: Secondary | ICD-10-CM

## 2018-08-14 DIAGNOSIS — A419 Sepsis, unspecified organism: Secondary | ICD-10-CM | POA: Diagnosis not present

## 2018-08-14 DIAGNOSIS — C7931 Secondary malignant neoplasm of brain: Secondary | ICD-10-CM | POA: Diagnosis present

## 2018-08-14 DIAGNOSIS — Z887 Allergy status to serum and vaccine status: Secondary | ICD-10-CM

## 2018-08-14 DIAGNOSIS — G893 Neoplasm related pain (acute) (chronic): Secondary | ICD-10-CM | POA: Diagnosis present

## 2018-08-14 DIAGNOSIS — T451X5A Adverse effect of antineoplastic and immunosuppressive drugs, initial encounter: Secondary | ICD-10-CM | POA: Diagnosis present

## 2018-08-14 DIAGNOSIS — Z952 Presence of prosthetic heart valve: Secondary | ICD-10-CM

## 2018-08-14 DIAGNOSIS — R509 Fever, unspecified: Secondary | ICD-10-CM

## 2018-08-14 DIAGNOSIS — Z7982 Long term (current) use of aspirin: Secondary | ICD-10-CM

## 2018-08-14 DIAGNOSIS — I340 Nonrheumatic mitral (valve) insufficiency: Secondary | ICD-10-CM | POA: Diagnosis present

## 2018-08-14 DIAGNOSIS — I10 Essential (primary) hypertension: Secondary | ICD-10-CM | POA: Diagnosis present

## 2018-08-14 DIAGNOSIS — Z7401 Bed confinement status: Secondary | ICD-10-CM

## 2018-08-14 DIAGNOSIS — C4A30 Merkel cell carcinoma of unspecified part of face: Secondary | ICD-10-CM | POA: Diagnosis present

## 2018-08-14 DIAGNOSIS — K219 Gastro-esophageal reflux disease without esophagitis: Secondary | ICD-10-CM | POA: Diagnosis present

## 2018-08-14 DIAGNOSIS — Z79891 Long term (current) use of opiate analgesic: Secondary | ICD-10-CM

## 2018-08-14 DIAGNOSIS — Z833 Family history of diabetes mellitus: Secondary | ICD-10-CM

## 2018-08-14 DIAGNOSIS — N182 Chronic kidney disease, stage 2 (mild): Secondary | ICD-10-CM | POA: Diagnosis present

## 2018-08-14 DIAGNOSIS — Z888 Allergy status to other drugs, medicaments and biological substances status: Secondary | ICD-10-CM

## 2018-08-14 DIAGNOSIS — I313 Pericardial effusion (noninflammatory): Secondary | ICD-10-CM | POA: Diagnosis present

## 2018-08-14 DIAGNOSIS — M545 Low back pain: Secondary | ICD-10-CM | POA: Diagnosis present

## 2018-08-14 DIAGNOSIS — I350 Nonrheumatic aortic (valve) stenosis: Secondary | ICD-10-CM

## 2018-08-14 DIAGNOSIS — R8281 Pyuria: Secondary | ICD-10-CM | POA: Diagnosis present

## 2018-08-14 DIAGNOSIS — Z791 Long term (current) use of non-steroidal anti-inflammatories (NSAID): Secondary | ICD-10-CM

## 2018-08-14 DIAGNOSIS — I129 Hypertensive chronic kidney disease with stage 1 through stage 4 chronic kidney disease, or unspecified chronic kidney disease: Secondary | ICD-10-CM | POA: Diagnosis present

## 2018-08-14 DIAGNOSIS — Z794 Long term (current) use of insulin: Secondary | ICD-10-CM

## 2018-08-14 DIAGNOSIS — E871 Hypo-osmolality and hyponatremia: Secondary | ICD-10-CM | POA: Diagnosis present

## 2018-08-14 DIAGNOSIS — Z79899 Other long term (current) drug therapy: Secondary | ICD-10-CM

## 2018-08-14 DIAGNOSIS — N390 Urinary tract infection, site not specified: Secondary | ICD-10-CM

## 2018-08-14 DIAGNOSIS — E785 Hyperlipidemia, unspecified: Secondary | ICD-10-CM | POA: Diagnosis present

## 2018-08-14 DIAGNOSIS — D6959 Other secondary thrombocytopenia: Secondary | ICD-10-CM | POA: Diagnosis present

## 2018-08-14 LAB — CBC WITH DIFFERENTIAL/PLATELET
Abs Immature Granulocytes: 0.04 10*3/uL (ref 0.00–0.07)
Basophils Absolute: 0 10*3/uL (ref 0.0–0.1)
Basophils Relative: 1 %
Eosinophils Absolute: 0 10*3/uL (ref 0.0–0.5)
Eosinophils Relative: 1 %
HEMATOCRIT: 30.5 % — AB (ref 39.0–52.0)
Hemoglobin: 9.8 g/dL — ABNORMAL LOW (ref 13.0–17.0)
Immature Granulocytes: 1 %
LYMPHS ABS: 0.3 10*3/uL — AB (ref 0.7–4.0)
LYMPHS PCT: 6 %
MCH: 29.6 pg (ref 26.0–34.0)
MCHC: 32.1 g/dL (ref 30.0–36.0)
MCV: 92.1 fL (ref 80.0–100.0)
Monocytes Absolute: 0.1 10*3/uL (ref 0.1–1.0)
Monocytes Relative: 3 %
Neutro Abs: 4.5 10*3/uL (ref 1.7–7.7)
Neutrophils Relative %: 88 %
Platelets: 166 10*3/uL (ref 150–400)
RBC: 3.31 MIL/uL — ABNORMAL LOW (ref 4.22–5.81)
RDW: 15.3 % (ref 11.5–15.5)
WBC: 5.1 10*3/uL (ref 4.0–10.5)
nRBC: 0 % (ref 0.0–0.2)

## 2018-08-14 LAB — COMPREHENSIVE METABOLIC PANEL
ALBUMIN: 3.1 g/dL — AB (ref 3.5–5.0)
ALT: 10 U/L (ref 0–44)
AST: 38 U/L (ref 15–41)
Alkaline Phosphatase: 63 U/L (ref 38–126)
Anion gap: 9 (ref 5–15)
BUN: 22 mg/dL (ref 8–23)
CO2: 21 mmol/L — ABNORMAL LOW (ref 22–32)
CREATININE: 0.93 mg/dL (ref 0.61–1.24)
Calcium: 8.4 mg/dL — ABNORMAL LOW (ref 8.9–10.3)
Chloride: 102 mmol/L (ref 98–111)
GFR calc Af Amer: >60 mL/min (ref >60)
GFR calc non Af Amer: >60 mL/min (ref >60)
Glucose, Bld: 241 mg/dL — ABNORMAL HIGH (ref 70–99)
Potassium: 4.3 mmol/L (ref 3.5–5.1)
Sodium: 132 mmol/L — ABNORMAL LOW (ref 135–145)
Total Bilirubin: 0.4 mg/dL (ref 0.3–1.2)
Total Protein: 6.2 g/dL — ABNORMAL LOW (ref 6.5–8.1)

## 2018-08-14 LAB — URINALYSIS, ROUTINE W REFLEX MICROSCOPIC
Bacteria, UA: NONE SEEN
Bilirubin Urine: NEGATIVE
Glucose, UA: >=500 mg/dL — AB
Ketones, ur: NEGATIVE mg/dL
Nitrite: NEGATIVE
Protein, ur: 30 mg/dL — AB
RBC / HPF: >50 RBC/hpf — ABNORMAL HIGH (ref 0–5)
Specific Gravity, Urine: 1.016 (ref 1.005–1.030)
WBC, UA: >50 WBC/hpf — ABNORMAL HIGH (ref 0–5)
pH: 6 (ref 5.0–8.0)

## 2018-08-14 LAB — LACTIC ACID, PLASMA
Lactic Acid, Venous: 1.6 mmol/L (ref 0.5–1.9)
Lactic Acid, Venous: 1.1 mmol/L (ref 0.5–1.9)

## 2018-08-14 LAB — GLUCOSE, CAPILLARY: GLUCOSE-CAPILLARY: 181 mg/dL — AB (ref 70–99)

## 2018-08-14 LAB — INFLUENZA PANEL BY PCR (TYPE A & B)
INFLAPCR: NEGATIVE
INFLBPCR: NEGATIVE

## 2018-08-14 MED ORDER — IBUPROFEN 400 MG PO TABS
600.0000 mg | ORAL_TABLET | Freq: Two times a day (BID) | ORAL | Status: DC
  Administered 2018-08-15 – 2018-08-17 (×6): 600 mg via ORAL
  Filled 2018-08-14 (×6): qty 2

## 2018-08-14 MED ORDER — METOPROLOL SUCCINATE ER 25 MG PO TB24
12.5000 mg | ORAL_TABLET | Freq: Every day | ORAL | Status: DC
  Administered 2018-08-15 – 2018-08-17 (×3): 12.5 mg via ORAL
  Filled 2018-08-14 (×3): qty 1

## 2018-08-14 MED ORDER — ASPIRIN EC 81 MG PO TBEC
81.0000 mg | DELAYED_RELEASE_TABLET | Freq: Every day | ORAL | Status: DC
  Administered 2018-08-15 – 2018-08-17 (×3): 81 mg via ORAL
  Filled 2018-08-14 (×3): qty 1

## 2018-08-14 MED ORDER — SODIUM CHLORIDE 0.9% FLUSH: 3.0000 mL | INTRAVENOUS | Status: DC | PRN

## 2018-08-14 MED ORDER — FLUCONAZOLE 100 MG PO TABS
200.0000 mg | ORAL_TABLET | Freq: Every day | ORAL | Status: DC
  Administered 2018-08-15 – 2018-08-17 (×3): 200 mg via ORAL
  Filled 2018-08-14 (×3): qty 2

## 2018-08-14 MED ORDER — PIPERACILLIN-TAZOBACTAM 3.375 G IVPB 30 MIN
3.3750 g | Freq: Once | INTRAVENOUS | Status: AC
  Administered 2018-08-14: 3.375 g via INTRAVENOUS
  Filled 2018-08-14: qty 50

## 2018-08-14 MED ORDER — PANTOPRAZOLE SODIUM 40 MG PO TBEC
40.0000 mg | DELAYED_RELEASE_TABLET | Freq: Every day | ORAL | Status: DC
  Administered 2018-08-15 – 2018-08-17 (×3): 40 mg via ORAL
  Filled 2018-08-14 (×3): qty 1

## 2018-08-14 MED ORDER — VANCOMYCIN HCL 10 G IV SOLR
INTRAVENOUS | Status: AC
  Filled 2018-08-14: qty 1500

## 2018-08-14 MED ORDER — VITAMIN D 25 MCG (1000 UNIT) PO TABS
5000.0000 [IU] | ORAL_TABLET | Freq: Every day | ORAL | Status: DC
  Administered 2018-08-15 – 2018-08-17 (×3): 5000 [IU] via ORAL
  Filled 2018-08-14 (×3): qty 5

## 2018-08-14 MED ORDER — VANCOMYCIN HCL 10 G IV SOLR
1500.0000 mg | Freq: Once | INTRAVENOUS | Status: DC
  Filled 2018-08-14: qty 1500

## 2018-08-14 MED ORDER — POLYETHYLENE GLYCOL 3350 17 G PO PACK
17.0000 g | PACK | Freq: Two times a day (BID) | ORAL | Status: DC
  Administered 2018-08-15: 17 g via ORAL
  Filled 2018-08-14 (×4): qty 1

## 2018-08-14 MED ORDER — VANCOMYCIN HCL 1.5 G IV SOLR
1500.0000 mg | Freq: Once | INTRAVENOUS | Status: AC
  Administered 2018-08-14: 1500 mg via INTRAVENOUS
  Filled 2018-08-14: qty 1500

## 2018-08-14 MED ORDER — SODIUM CHLORIDE 0.9% FLUSH
3.0000 mL | Freq: Two times a day (BID) | INTRAVENOUS | Status: DC
  Administered 2018-08-15 – 2018-08-17 (×4): 3 mL via INTRAVENOUS

## 2018-08-14 MED ORDER — CALCIUM CARBONATE 1500 (600 CA) MG PO TABS
1.0000 | ORAL_TABLET | Freq: Every day | ORAL | Status: DC
  Filled 2018-08-14 (×2): qty 1

## 2018-08-14 MED ORDER — ONDANSETRON HCL 4 MG PO TABS: 8.0000 mg | ORAL_TABLET | Freq: Three times a day (TID) | ORAL | Status: DC | PRN

## 2018-08-14 MED ORDER — SODIUM CHLORIDE 0.9 % IV BOLUS
2000.0000 mL | Freq: Once | INTRAVENOUS | Status: AC
  Administered 2018-08-14: 2000 mL via INTRAVENOUS

## 2018-08-14 MED ORDER — VANCOMYCIN HCL IN DEXTROSE 1-5 GM/200ML-% IV SOLN
1000.0000 mg | Freq: Once | INTRAVENOUS | Status: DC
  Filled 2018-08-14: qty 200

## 2018-08-14 MED ORDER — ACETAMINOPHEN 500 MG PO TABS: 500.0000 mg | ORAL_TABLET | ORAL | Status: DC | PRN

## 2018-08-14 MED ORDER — INSULIN GLARGINE 100 UNIT/ML ~~LOC~~ SOLN
8.0000 [IU] | Freq: Every day | SUBCUTANEOUS | Status: DC
  Administered 2018-08-15 – 2018-08-16 (×3): 8 [IU] via SUBCUTANEOUS
  Filled 2018-08-14 (×4): qty 0.08

## 2018-08-14 MED ORDER — PROCHLORPERAZINE MALEATE 5 MG PO TABS: 10.0000 mg | ORAL_TABLET | Freq: Four times a day (QID) | ORAL | Status: DC | PRN

## 2018-08-14 MED ORDER — SODIUM CHLORIDE 0.9 % IV SOLN: 250.0000 mL | INTRAVENOUS | Status: DC | PRN

## 2018-08-14 NOTE — ED Triage Notes (Signed)
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  1 gram of Tylenol given by family for fever that began an hour ago.  100.9 per EMS.

## 2018-08-14 NOTE — H&P (Signed)
History and Physical    Jesse Cordova XBL:[id number] DOB: 08-27-1940 DOA: 08/14/2018  PCP: [name], MD  Patient coming from:  home  Chief Complaint: weakness  HPI: Jesse Cordova is a 78 y.o. male with medical history significant of merkels cancer, htn, on chemo at [facility] comes in with generalized weakness and fatigue with subjective fever at home.  No pain anywhere.  No n/v/d.  No cough or sob.  No rashes.  No flu contacts.  Found to be febrile here .  Flu pending.  ua pending.  Referred for admission for fever in chemo pt.  Review of Systems: As per HPI otherwise 10 point review of systems negative.   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   26 mm Edwards Sapien 3 transcatheter heart valve placed via percutaneous right transfemoral approach     Past Surgical History:  Procedure Laterality Date   BIOPSY  01/16/2012   Procedure: BIOPSY;  Surgeon: Rogene Houston, MD;  [facility];  Service: Endoscopy;  Laterality: N/A;   CATARACT EXTRACTION Right 7/06   CATARACT EXTRACTION W/PHACO Left 01/05/2014   Procedure: CATARACT EXTRACTION PHACO AND INTRAOCULAR LENS PLACEMENT (IOC);  Surgeon: Tonny Branch, MD;  Location: [facility] ORS;  Service: Ophthalmology;  Laterality: Left;  CDE:15.98   COLONOSCOPY  01/29/12   abnormal repeat in one year   ESOPHAGOGASTRODUODENOSCOPY     KNEE ARTHROSCOPY     2 right knee surgeries and 1 left knee surgery     RIGHT/LEFT HEART CATH AND CORONARY ANGIOGRAPHY N/A 11/25/2017   Procedure: RIGHT/LEFT HEART CATH AND CORONARY ANGIOGRAPHY;  Surgeon: Burnell Blanks, MD;  Location: [place] CV LAB;  Service: Cardiovascular;  Laterality: N/A;   TEE WITHOUT CARDIOVERSION N/A 12/22/2017   Procedure: TRANSESOPHAGEAL ECHOCARDIOGRAM (TEE);  Surgeon: Burnell Blanks, MD;  Location: [place];  Service: Open Heart Surgery;  Laterality: N/A;   TONSILLECTOMY     TRANSCATHETER AORTIC VALVE REPLACEMENT, TRANSFEMORAL N/A 12/22/2017   Procedure: TRANSCATHETER AORTIC VALVE REPLACEMENT, TRANSFEMORAL;  Surgeon: Burnell Blanks, MD;  Location: [place];  Service: Open Heart Surgery;  Laterality: N/A;   VASECTOMY       reports that he has never smoked. He has never used smokeless tobacco. He reports that he does not drink alcohol or use drugs.  Allergies  Allergen Reactions   Cortisone Other (See Comments)    swelling   Tetanus Toxoids Other (See Comments)    Swelling, not specified   Hydrocodone-Homatropine Nausea And Vomiting    Family History  Problem Relation Age of Onset   Diabetes Sister    Heart disease Sister    Atrial fibrillation Sister    Heart disease Other    Diabetes Other    Depression Maternal Grandfather     Prior to Admission medications   Medication Sig Start Date End Date Taking? Authorizing Provider  acetaminophen (TYLENOL) 500 MG tablet Take 500 mg by mouth every 4 (four) hours as needed for mild pain or moderate pain.    Yes [provider]  aspirin EC 81 MG tablet Take 81 mg by mouth daily.   Yes [provider]  calcium carbonate (CALCIUM 600) 600 MG TABS tablet Take 1 tablet by mouth daily.   Yes [provider]  Cholecalciferol (DIALYVITE VITAMIN D 5000) 125 MCG (5000 UT) capsule Take 5,000 Units by mouth daily.   Yes [provider]  denosumab (PROLIA) 60 MG/ML SOSY injection Inject 120 mg into the skin every 6 (six)  months.    Yes [provider]  diclofenac sodium (VOLTAREN) 1 % GEL Apply 1 application topically 4 (four) times daily.   Yes [provider]  diphenhydrAMINE-APAP, sleep, (TYLENOL PM EXTRA STRENGTH PO) Take 1 tablet by mouth at bedtime.   Yes [provider]  feeding supplement, ENSURE ENLIVE, (ENSURE ENLIVE) LIQD Take 237 mLs by mouth 2 (two) times daily between meals. Patient taking differently: Take 237 mLs by mouth 3 (three) times daily with meals.  07/24/18  Yes Emokpae, Courage, MD  fluconazole (DIFLUCAN) 200 MG tablet Take 1 tablet (200 mg total) by mouth daily for 7 days. 08/09/18 08/16/18 Yes Luking, Elayne Snare, MD  ibuprofen (ADVIL,MOTRIN) 600 MG tablet Take 600 mg by mouth 2 (two) times daily.   Yes [provider]  insulin glargine (LANTUS) 100 UNIT/ML injection Inject 0.08 mLs (8 Units total) into the skin at bedtime. 07/23/18  Yes Emokpae, Courage, MD  INV-pembrolizumab LCCC 1525 (KEYTRUDA) 100 MG/4ML SOLN Inject 200 mg into the vein every 21 ( twenty-one) days.   Yes [provider]  lovastatin (MEVACOR) 20 MG tablet Take 20 mg by mouth every evening.   Yes [provider]  metoprolol succinate (TOPROL-XL) 25 MG 24 hr tablet Take 12.5 mg by mouth daily.   Yes [provider]  Multiple Vitamins-Minerals (SENTRY ADULT PO) Take 1 tablet by mouth daily.   Yes [provider]  nystatin (MYCOSTATIN) 100000 UNIT/ML suspension One tsp swish and swallow four times per day for seven. Patient taking differently: Take 5 mLs by mouth 4 (four) times daily. One tsp swish and swallow four times per day for seven. 08/09/18  Yes Luking, Elayne Snare, MD  ondansetron (ZOFRAN) 8 MG tablet Take 8 mg by mouth every 8 (eight) hours as needed for nausea or vomiting. Takes scheduled. If missed doses will be significant   Yes [provider]  Oxycodone HCl 10 MG TABS 1 every 4 hours as needed severe pain caution drowsiness metastatic  cancer Patient taking differently: Take 10 mg by mouth at bedtime as needed. 1 every 4 hours as needed severe pain caution drowsiness metastatic cancer 07/06/18  Yes Luking, Scott A, MD  pantoprazole (PROTONIX) 40 MG tablet Take 40 mg by mouth daily.   Yes [provider]  polyethylene glycol (MIRALAX / GLYCOLAX) packet Take 17 g by mouth 2 (two) times daily.   Yes [provider]  prochlorperazine (COMPAZINE) 10 MG tablet Take 10 mg by mouth every 6 (six) hours as needed for nausea or vomiting.   Yes [provider]    Physical Exam: Vitals:   08/14/18 2130 08/14/18 2145 08/14/18 2206 08/14/18 2245  BP: 100/60  (!) 95/53 (!) 91/53  Pulse: (!) 110 (!) 107 (!) 107 (!) 106  Resp: 14 15 14    Temp:   98.8 F (37.1 C) 99.1 F (  37.3 C)  TempSrc:   Oral Oral  SpO2: 96% 94% 96% 97%  Weight:      Height:          Constitutional: NAD, calm, comfortable Vitals:   08/14/18 2130 08/14/18 2145 08/14/18 2206 08/14/18 2245  BP: 100/60  (!) 95/53 (!) 91/53  Pulse: (!) 110 (!) 107 (!) 107 (!) 106  Resp: 14 15 14    Temp:   98.8 F (37.1 C) 99.1 F (37.3 C)  TempSrc:   Oral Oral  SpO2: 96% 94% 96% 97%  Weight:      Height:       Eyes: PERRL, lids and conjunctivae normal ENMT: Mucous membranes are moist. Posterior pharynx clear of any exudate or lesions.Normal dentition.  Neck: normal, supple, no masses, no thyromegaly Respiratory: clear to auscultation bilaterally, no wheezing, no crackles. Normal respiratory effort. No accessory muscle use.  Cardiovascular: Regular rate and rhythm, no murmurs / rubs / gallops. No extremity edema. 2+ pedal pulses. No carotid bruits.  Abdomen: no tenderness, no masses palpated. No hepatosplenomegaly. Bowel sounds positive.  Musculoskeletal: no clubbing / cyanosis. No joint deformity upper and lower extremities. Good ROM, no contractures. Normal muscle tone.  Skin: no rashes, lesions, ulcers. No induration Neurologic: CN 2-12 grossly  intact. Sensation intact, DTR normal. Strength 5/5 in all 4.  Psychiatric: Normal judgment and insight. Alert and oriented x 3. Normal mood.    Labs on Admission: I have personally reviewed following labs and imaging studies  CBC: Recent Labs  Lab 08/14/18 2012  WBC 5.1  NEUTROABS 4.5  HGB 9.8*  HCT 30.5*  MCV 92.1  PLT 262   Basic Metabolic Panel: Recent Labs  Lab 08/14/18 2012  NA 132*  K 4.3  CL 102  CO2 21*  GLUCOSE 241*  BUN 22  CREATININE 0.93  CALCIUM 8.4*   GFR: Estimated Creatinine Clearance: 69.3 mL/min (by C-G formula based on SCr of 0.93 mg/dL). Liver Function Tests: Recent Labs  Lab 08/14/18 2012  AST 38  ALT 10  ALKPHOS 63  BILITOT 0.4  PROT 6.2*  ALBUMIN 3.1*   No results for input(s): LIPASE, AMYLASE in the last 168 hours. No results for input(s): AMMONIA in the last 168 hours. Coagulation Profile: No results for input(s): INR, PROTIME in the last 168 hours. Cardiac Enzymes: No results for input(s): CKTOTAL, CKMB, CKMBINDEX, TROPONINI in the last 168 hours. BNP (last 3 results) No results for input(s): PROBNP in the last 8760 hours. HbA1C: No results for input(s): HGBA1C in the last 72 hours. CBG: No results for input(s): GLUCAP in the last 168 hours. Lipid Profile: No results for input(s): CHOL, HDL, LDLCALC, TRIG, CHOLHDL, LDLDIRECT in the last 72 hours. Thyroid Function Tests: No results for input(s): TSH, T4TOTAL, FREET4, T3FREE, THYROIDAB in the last 72 hours. Anemia Panel: No results for input(s): VITAMINB12, FOLATE, FERRITIN, TIBC, IRON, RETICCTPCT in the last 72 hours. Sepsis Labs: !!!!!!!!!!!!!!!!!!!!!!!!!!!!!!!!!!!!!!!!!!!! @LABRCNTIP (procalcitonin:4,lacticidven:4) ) Recent Results (from the past 240 hour(s))  Blood Culture (routine x 2)     Status: None (Preliminary result)   Collection Time: 08/14/18  8:13 PM  Result Value Ref Range Status   Specimen Description BLOOD LEFT HAND  Final   Special Requests   Final     BOTTLES DRAWN AEROBIC AND ANAEROBIC Blood Culture adequate volume Performed at [facility], [address] [place], [place] [address]    Culture PENDING  Incomplete   Report Status PENDING  Incomplete  Blood Culture (routine x 2)  Status: None (Preliminary result)   Collection Time: 08/14/18  8:25 PM  Result Value Ref Range Status   Specimen Description BLOOD RIGHT HAND  Final   Special Requests   Final    BOTTLES DRAWN AEROBIC AND ANAEROBIC Blood Culture adequate volume Performed at [facility], [address], [place], [place] [address]    Culture PENDING  Incomplete   Report Status PENDING  Incomplete     Radiological Exams on Admission: Dg Chest Portable 1 View  Result Date: 08/14/2018 CLINICAL DATA:  Code sepsis, fever EXAM: PORTABLE CHEST 1 VIEW COMPARISON:  07/20/2018 FINDINGS: The heart size and mediastinal contours are within normal limits. Both lungs are clear. The visualized skeletal structures are unremarkable. Aortic atherosclerosis. IMPRESSION: No active disease. Electronically Signed   By: Donavan Foil M.D.   On: 08/14/2018 20:31   Old chart reviewed Case discussed with dr zammit in the ed  Assessment/Plan 78 yo male merkels cancer on chemo comes in with weakness, fever  Principal Problem:   Fever- cover with zosyn /vanc.  ua and flu penidng.  cxr neg.  No rashes.  Source unclear. Lactic nml.  bp nml.  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

## 2018-08-14 NOTE — Telephone Encounter (Signed)
Please give verbal permission for this

## 2018-08-14 NOTE — ED Provider Notes (Signed)
[facility] EMERGENCY DEPARTMENT Provider Note   CSN: [id number] Arrival date & time: 08/14/18  1953    History   Chief Complaint Chief Complaint  Patient presents with  . Fever    HPI Jesse Cordova is a 78 y.o. male.     Patient has a history of Merkel cell carcinoma.  He received chemotherapy 8 days ago.  Yesterday started feeling poor week dehydrated.  He started with a fever today no other symptoms  The history is provided by the patient. No language interpreter was used.  Fever  Max temp prior to arrival:  101.4 Temp source:  Oral Severity:  Moderate Onset quality:  Sudden Timing:  Constant Progression:  Worsening Chronicity:  New Relieved by:  Acetaminophen Worsened by:  Nothing Ineffective treatments:  Acetaminophen Associated symptoms: no chest pain, no congestion, no cough, no diarrhea, no headaches and no rash   Risk factors: no contaminated food     Past Medical History:  Diagnosis Date  . Adrenal nodule ([place]) 11/10/2011  . Arthritis   . Cancer ([place])    skin cancer  . Chronic low back pain   . Complication of anesthesia   . Diabetes mellitus   . GERD (gastroesophageal reflux disease)   . History of hip surgery 06/02/2018   right hip, right knee  . Hyperlipemia   . Hypertension   . 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 PONV (postoperative nausea and vomiting)   . 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 Fever 08/14/2018  . Sepsis ([place]) 07/20/2018  . CKD stage 2 due to type 2 diabetes mellitus ([place]) 07/06/2018  . Fracture of femoral neck, right, closed ([place]) 06/05/2018  . GERD (gastroesophageal reflux disease) 06/05/2018  . CAD (coronary artery disease) 06/05/2018  .  Chest pain 06/05/2018  . 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 Severe aortic stenosis 12/22/2017  . 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 Essential hypertension, benign 11/15/2012  . Hyperlipidemia 11/15/2012  . Controlled diabetes mellitus type 2 with complications ([place]) 08/65/7846  . Adrenal nodule ([place]) 11/10/2011    Past Surgical History:  Procedure Laterality Date  . BIOPSY  01/16/2012   Procedure: BIOPSY;  Surgeon: Rogene Houston, MD;  Location: [facility] END[facility];  Service: Endoscopy;  Laterality: N/A;  . CATARACT EXTRACTION Right 7/06  . CATARACT EXTRACTION W/PHACO Left 01/05/2014   Procedure: CATARACT EXTRACTION PHACO AND INTRAOCULAR LENS PLACEMENT (IOC);  Surgeon: Tonny Branch, MD;  Location: [facility] ORS;  Service: Ophthalmology;  Laterality: Left;  CDE:15.98  . COLONOSCOPY  01/29/12   abnormal repeat in one year  . ESOPHAGOGASTRODUODENOSCOPY    . KNEE ARTHROSCOPY     2 right knee surgeries and 1 left knee surgery  . RIGHT/LEFT HEART CATH AND CORONARY ANGIOGRAPHY N/A 11/25/2017   Procedure: RIGHT/LEFT HEART CATH AND CORONARY ANGIOGRAPHY;  Surgeon: Burnell Blanks, MD;  Location: [facility] CV LAB;  Service: Cardiovascular;  Laterality: N/A;  . TEE WITHOUT CARDIOVERSION N/A 12/22/2017   Procedure: TRANSESOPHAGEAL ECHOCARDIOGRAM (TEE);  Surgeon: Burnell Blanks, MD;  Location: [facility];  Service: Open Heart Surgery;  Laterality: N/A;  . TONSILLECTOMY    . TRANSCATHETER AORTIC VALVE REPLACEMENT, TRANSFEMORAL N/A 12/22/2017  Procedure: TRANSCATHETER AORTIC VALVE REPLACEMENT, TRANSFEMORAL;  Surgeon: Burnell Blanks, MD;  Location: [facility];  Service: Open Heart Surgery;  Laterality: N/A;  . VASECTOMY          Home Medications    Prior to Admission medications   Medication Sig Start Date End Date Taking? Authorizing Provider  acetaminophen (TYLENOL) 500 MG tablet Take 500 mg by mouth every 4 (four) hours as needed for mild pain or  moderate pain.    Yes [provider]  aspirin EC 81 MG tablet Take 81 mg by mouth daily.   Yes [provider]  calcium carbonate (CALCIUM 600) 600 MG TABS tablet Take 1 tablet by mouth daily.   Yes [provider]  Cholecalciferol (DIALYVITE VITAMIN D 5000) 125 MCG (5000 UT) capsule Take 5,000 Units by mouth daily.   Yes [provider]  denosumab (PROLIA) 60 MG/ML SOSY injection Inject 120 mg into the skin every 6 (six) months.    Yes [provider]  diclofenac sodium (VOLTAREN) 1 % GEL Apply 1 application topically 4 (four) times daily.   Yes [provider]  diphenhydrAMINE-APAP, sleep, (TYLENOL PM EXTRA STRENGTH PO) Take 1 tablet by mouth at bedtime.   Yes [provider]  feeding supplement, ENSURE ENLIVE, (ENSURE ENLIVE) LIQD Take 237 mLs by mouth 2 (two) times daily between meals. Patient taking differently: Take 237 mLs by mouth 3 (three) times daily with meals.  07/24/18  Yes Emokpae, Courage, MD  fluconazole (DIFLUCAN) 200 MG tablet Take 1 tablet (200 mg total) by mouth daily for 7 days. 08/09/18 08/16/18 Yes Luking, Elayne Snare, MD  ibuprofen (ADVIL,MOTRIN) 600 MG tablet Take 600 mg by mouth 2 (two) times daily.   Yes [provider]  insulin glargine (LANTUS) 100 UNIT/ML injection Inject 0.08 mLs (8 Units total) into the skin at bedtime. 07/23/18  Yes Emokpae, Courage, MD  INV-pembrolizumab LCCC 1525 (KEYTRUDA) 100 MG/4ML SOLN Inject 200 mg into the vein every 21 ( twenty-one) days.   Yes [provider]  lovastatin (MEVACOR) 20 MG tablet Take 20 mg by mouth every evening.   Yes [provider]  metoprolol succinate (TOPROL-XL) 25 MG 24 hr tablet Take 12.5 mg by mouth daily.   Yes [provider]  Multiple Vitamins-Minerals (SENTRY ADULT PO) Take 1 tablet by mouth daily.   Yes [provider]  nystatin (MYCOSTATIN) 100000 UNIT/ML suspension One tsp swish and swallow four times per day for  seven. Patient taking differently: Take 5 mLs by mouth 4 (four) times daily. One tsp swish and swallow four times per day for seven. 08/09/18  Yes Luking, Elayne Snare, MD  ondansetron (ZOFRAN) 8 MG tablet Take 8 mg by mouth every 8 (eight) hours as needed for nausea or vomiting. Takes scheduled. If missed doses will be significant   Yes [provider]  Oxycodone HCl 10 MG TABS 1 every 4 hours as needed severe pain caution drowsiness metastatic cancer Patient taking differently: Take 10 mg by mouth at bedtime as needed. 1 every 4 hours as needed severe pain caution drowsiness metastatic cancer 07/06/18  Yes Luking, Scott A, MD  pantoprazole (PROTONIX) 40 MG tablet Take 40 mg by mouth daily.   Yes [provider]  polyethylene glycol (MIRALAX / GLYCOLAX) packet Take 17 g by mouth 2 (two) times daily.   Yes [provider]  prochlorperazine (COMPAZINE) 10 MG tablet Take 10 mg by mouth every 6 (six) hours as needed for nausea or  vomiting.   Yes [provider]    Family History Family History  Problem Relation Age of Onset  . Diabetes Sister   . Heart disease Sister   . Atrial fibrillation Sister   . Heart disease Other   . Diabetes Other   . Depression Maternal Grandfather     Social History Social History   Tobacco Use  . Smoking status: Never Smoker  . Smokeless tobacco: Never Used  . Tobacco comment: From spouse. Wife does not smoke around him now  Substance Use Topics  . Alcohol use: No    Alcohol/week: 0.0 standard drinks  . Drug use: No     Allergies   Cortisone; Tetanus toxoids; and Hydrocodone-homatropine   Review of Systems Review of Systems  Constitutional: Positive for fatigue and fever. Negative for appetite change.  HENT: Negative for congestion, ear discharge and sinus pressure.   Eyes: Negative for discharge.  Respiratory: Negative for cough.   Cardiovascular: Negative for chest pain.  Gastrointestinal: Negative for abdominal pain  and diarrhea.  Genitourinary: Positive for frequency. Negative for hematuria.  Musculoskeletal: Negative for back pain.  Skin: Negative for rash.  Neurological: Negative for seizures and headaches.  Psychiatric/Behavioral: Negative for hallucinations.     Physical Exam Updated Vital Signs BP (!) 111/57   Pulse (!) 114   Temp (!) 101.4 F (38.6 C) (Oral)   Resp 13   Ht 6\' 1"  (1.854 m)   Wt 74.8 kg   SpO2 94%   BMI 21.77 kg/m   Physical Exam Vitals signs and nursing note reviewed.  Constitutional:      Appearance: He is well-developed.  HENT:     Head: Normocephalic.     Comments: Mucous membranes dry    Nose: Nose normal.  Eyes:     General: No scleral icterus.    Conjunctiva/sclera: Conjunctivae normal.  Neck:     Musculoskeletal: Neck supple.     Thyroid: No thyromegaly.  Cardiovascular:     Rate and Rhythm: Regular rhythm.     Heart sounds: No murmur. No friction rub. No gallop.      Comments: Cardiac Pulmonary:     Breath sounds: No stridor. No wheezing or rales.  Chest:     Chest wall: No tenderness.  Abdominal:     General: There is no distension.     Tenderness: There is no abdominal tenderness. There is no rebound.  Musculoskeletal: Normal range of motion.  Lymphadenopathy:     Cervical: No cervical adenopathy.  Skin:    Findings: No erythema or rash.  Neurological:     Mental Status: He is oriented to person, place, and time.     Motor: No abnormal muscle tone.     Coordination: Coordination normal.  Psychiatric:        Behavior: Behavior normal.      ED Treatments / Results  Labs (all labs ordered are listed, but only abnormal results are displayed) Labs Reviewed  COMPREHENSIVE METABOLIC PANEL - Abnormal; Notable for the following components:      Result Value   Sodium 132 (*)    CO2 21 (*)    Glucose, Bld 241 (*)    Calcium 8.4 (*)    Total Protein 6.2 (*)    Albumin 3.1 (*)    All other components within normal limits  CBC WITH  DIFFERENTIAL/PLATELET - Abnormal; Notable for the following components:   RBC 3.31 (*)    Hemoglobin 9.8 (*)    HCT  30.5 (*)    Lymphs Abs 0.3 (*)    All other components within normal limits  CULTURE, BLOOD (ROUTINE X 2)  CULTURE, BLOOD (ROUTINE X 2)  LACTIC ACID, PLASMA  LACTIC ACID, PLASMA  URINALYSIS, ROUTINE W REFLEX MICROSCOPIC  INFLUENZA PANEL BY PCR (TYPE A & B)    EKG None  Radiology Dg Chest Portable 1 View  Result Date: 08/14/2018 CLINICAL DATA:  Code sepsis, fever EXAM: PORTABLE CHEST 1 VIEW COMPARISON:  07/20/2018 FINDINGS: The heart size and mediastinal contours are within normal limits. Both lungs are clear. The visualized skeletal structures are unremarkable. Aortic atherosclerosis. IMPRESSION: No active disease. Electronically Signed   By: Donavan Foil M.D.   On: 08/14/2018 20:31    Procedures Procedures (including critical care time)  Medications Ordered in ED Medications  vancomycin (VANCOCIN) 1,500 mg in sodium chloride 0.9 % 500 mL IVPB (has no administration in time range)  sodium chloride 0.9 % bolus 2,000 mL (2,000 mLs Intravenous New Bag/Given 08/14/18 2033)  piperacillin-tazobactam (ZOSYN) IVPB 3.375 g (3.375 g Intravenous New Bag/Given 08/14/18 2044)     Initial Impression / Assessment and Plan / ED Course  I have reviewed the triage vital signs and the nursing notes.  Pertinent labs & imaging results that were available during my care of the patient were reviewed by me and considered in my medical decision making (see chart for details). CRITICAL CARE Performed by: Milton Ferguson Total critical care time: 40  minutes Critical care time was exclusive of separately billable procedures and treating other patients. Critical care was necessary to treat or prevent imminent or life-threatening deterioration. Critical care was time spent personally by me on the following activities: development of treatment plan with patient and/or surrogate as well as  nursing, discussions with consultants, evaluation of patient's response to treatment, examination of patient, obtaining history from patient or surrogate, ordering and performing treatments and interventions, ordering and review of laboratory studies, ordering and review of radiographic studies, pulse oximetry and re-evaluation of patient's condition.   Patient with a febrile illness and status post chemotherapy 8 days ago for Merkel cell carcinoma.  Labs unremarkable.  Chest x-ray shows no pneumonia.  Urinalysis pending and flu pending.  I spoke with the oncologist Dr. Delton Coombes and he stated to admit the patient at any [facility] and place him on Zosyn.  When his fever is gone for 24 hours he can be placed on Augmentin by mouth.  Patient will be admitted to medicine service for febrile illness and tachycardia      Final Clinical Impressions(s) / ED Diagnoses   Final diagnoses:  Febrile illness    ED Discharge Orders    None       Milton Ferguson, MD 08/14/18 2146

## 2018-08-15 ENCOUNTER — Encounter (HOSPITAL_COMMUNITY): Payer: Self-pay

## 2018-08-15 ENCOUNTER — Other Ambulatory Visit: Payer: Self-pay

## 2018-08-15 DIAGNOSIS — N390 Urinary tract infection, site not specified: Secondary | ICD-10-CM | POA: Diagnosis not present

## 2018-08-15 DIAGNOSIS — E785 Hyperlipidemia, unspecified: Secondary | ICD-10-CM | POA: Diagnosis present

## 2018-08-15 DIAGNOSIS — E1122 Type 2 diabetes mellitus with diabetic chronic kidney disease: Secondary | ICD-10-CM | POA: Diagnosis present

## 2018-08-15 DIAGNOSIS — I129 Hypertensive chronic kidney disease with stage 1 through stage 4 chronic kidney disease, or unspecified chronic kidney disease: Secondary | ICD-10-CM | POA: Diagnosis present

## 2018-08-15 DIAGNOSIS — I313 Pericardial effusion (noninflammatory): Secondary | ICD-10-CM | POA: Diagnosis present

## 2018-08-15 DIAGNOSIS — I5189 Other ill-defined heart diseases: Secondary | ICD-10-CM | POA: Diagnosis not present

## 2018-08-15 DIAGNOSIS — C4A30 Merkel cell carcinoma of unspecified part of face: Secondary | ICD-10-CM | POA: Diagnosis present

## 2018-08-15 DIAGNOSIS — N182 Chronic kidney disease, stage 2 (mild): Secondary | ICD-10-CM | POA: Diagnosis present

## 2018-08-15 DIAGNOSIS — D6959 Other secondary thrombocytopenia: Secondary | ICD-10-CM | POA: Diagnosis present

## 2018-08-15 DIAGNOSIS — R509 Fever, unspecified: Secondary | ICD-10-CM | POA: Diagnosis present

## 2018-08-15 DIAGNOSIS — Z952 Presence of prosthetic heart valve: Secondary | ICD-10-CM | POA: Diagnosis not present

## 2018-08-15 DIAGNOSIS — T451X5A Adverse effect of antineoplastic and immunosuppressive drugs, initial encounter: Secondary | ICD-10-CM | POA: Diagnosis present

## 2018-08-15 DIAGNOSIS — K219 Gastro-esophageal reflux disease without esophagitis: Secondary | ICD-10-CM | POA: Diagnosis present

## 2018-08-15 DIAGNOSIS — G893 Neoplasm related pain (acute) (chronic): Secondary | ICD-10-CM | POA: Diagnosis present

## 2018-08-15 DIAGNOSIS — A419 Sepsis, unspecified organism: Secondary | ICD-10-CM | POA: Diagnosis present

## 2018-08-15 DIAGNOSIS — I2510 Atherosclerotic heart disease of native coronary artery without angina pectoris: Secondary | ICD-10-CM | POA: Diagnosis present

## 2018-08-15 DIAGNOSIS — R8281 Pyuria: Secondary | ICD-10-CM | POA: Diagnosis present

## 2018-08-15 DIAGNOSIS — I10 Essential (primary) hypertension: Secondary | ICD-10-CM

## 2018-08-15 DIAGNOSIS — C7931 Secondary malignant neoplasm of brain: Secondary | ICD-10-CM | POA: Diagnosis present

## 2018-08-15 DIAGNOSIS — M545 Low back pain: Secondary | ICD-10-CM | POA: Diagnosis present

## 2018-08-15 DIAGNOSIS — E871 Hypo-osmolality and hyponatremia: Secondary | ICD-10-CM | POA: Diagnosis present

## 2018-08-15 DIAGNOSIS — I340 Nonrheumatic mitral (valve) insufficiency: Secondary | ICD-10-CM | POA: Diagnosis present

## 2018-08-15 MED ORDER — PIPERACILLIN-TAZOBACTAM 3.375 G IVPB
3.3750 g | Freq: Three times a day (TID) | INTRAVENOUS | Status: DC
  Administered 2018-08-15 – 2018-08-16 (×3): 3.375 g via INTRAVENOUS
  Filled 2018-08-15 (×3): qty 50

## 2018-08-15 MED ORDER — PIPERACILLIN-TAZOBACTAM 3.375 G IVPB 30 MIN
3.3750 g | Freq: Once | INTRAVENOUS | Status: AC
  Administered 2018-08-15: 3.375 g via INTRAVENOUS
  Filled 2018-08-15 (×2): qty 50

## 2018-08-15 MED ORDER — PROCHLORPERAZINE MALEATE 5 MG PO TABS: 10.0000 mg | ORAL_TABLET | Freq: Four times a day (QID) | ORAL | Status: DC | PRN

## 2018-08-15 MED ORDER — CALCIUM CARBONATE 1250 (500 CA) MG PO TABS
1250.0000 mg | ORAL_TABLET | Freq: Every day | ORAL | Status: DC
  Administered 2018-08-15 – 2018-08-17 (×3): 1250 mg via ORAL
  Filled 2018-08-15 (×3): qty 1

## 2018-08-15 MED ORDER — VANCOMYCIN HCL IN DEXTROSE 1-5 GM/200ML-% IV SOLN
1000.0000 mg | Freq: Two times a day (BID) | INTRAVENOUS | Status: DC
  Administered 2018-08-15 – 2018-08-16 (×3): 1000 mg via INTRAVENOUS
  Filled 2018-08-15 (×3): qty 200

## 2018-08-15 NOTE — Progress Notes (Signed)
Pharmacy Antibiotic Note  Jesse Cordova is a 78 y.o. male admitted on 08/14/2018 with sepsis.  Pharmacy has been consulted for Vancomycin and zosyn dosing.  Plan: Vancomycin 1500mg  loading dose, then 1000mg   IV every 12 hours.  Goal trough 15-20 mcg/mL. Zosyn 3.375g IV q8h (4 hour infusion).  F/u cxs and clinical progress Monitor V/S, labs, and levels as indicated  Height: 6\' 1"  (185.4 cm) Weight: 165 lb (74.8 kg) IBW/kg (Calculated) : 79.9  Temp (24hrs), Avg:99.4 F (37.4 C), Min:98.4 F (36.9 C), Max:101.4 F (38.6 C)  Recent Labs  Lab 08/14/18 2012 08/14/18 2213  WBC 5.1  --   CREATININE 0.93  --   LATICACIDVEN 1.6 1.1    Estimated Creatinine Clearance: 69.3 mL/min (by C-G formula based on SCr of 0.93 mg/dL).    Allergies  Allergen Reactions  . Cortisone Other (See Comments)    swelling  . Tetanus Toxoids Other (See Comments)    Swelling, not specified  . Hydrocodone-Homatropine Nausea And Vomiting    Antimicrobials this admission: Vancomycin 3/14>>  Zosyn 3/14 >>   Dose adjustments this admission: n/a  Microbiology results: 3/14 BCx: pending 3/14 influenza: negative  MRSA PCR:   Thank you for allowing pharmacy to be a part of this patient's care.  Isac Sarna, BS [place], [place] Clinical Pharmacist Pager [phone] 08/15/2018 9:42 AM

## 2018-08-15 NOTE — Progress Notes (Addendum)
PROGRESS NOTE  Jesse Cordova IWL:[id number] DOB: 09/05/1940 DOA: 08/14/2018 PCP: [name], MD  HPI/Recap of past 24 hours: Jesse Cordova is a 78 year old male with medical history significant for Merkel's cancer, hypertension, currently on chemotherapy at [facility] presented to the emergency department with generalized weakness fatigue and fever at home.  Subjective: Patient seen and examined at bedside he denies any fever his T-max is 101.4.  He denies any fever chills or pain no nausea vomiting  Assessment/Plan: Principal Problem:   Fever Active Problems:   Essential hypertension, benign   Merkel cell carcinoma of face (HCC)   Severe aortic stenosis   CKD stage 2 due to type 2 diabetes mellitus ([place])  1.  Urosepsis.  His UA is abnormal.  He was started on vancomycin and Zosyn we will continue with that  2.  Fever in an adult chest x-ray was negative but his UA was abnormal  3.  Hypertension we will hold his home medicine until blood pressure improves  4.  Chronic kidney disease stage II due to type 2 diabetes mellitus it is at baseline  5.  Type 2 diabetes mellitus continue home medication  6.  Merkel  cell carcinoma of the face continue following up with oncology  Code Status: Full  Severity of Illness: The appropriate patient status for this patient is INPATIENT. Inpatient status is judged to be reasonable and necessary in order to provide the required intensity of service to ensure the patient's safety. The patient's presenting symptoms, physical exam findings, and initial radiographic and laboratory data in the context of their chronic comorbidities is felt to place them at high risk for further clinical deterioration. Furthermore, it is not anticipated that the patient will be medically stable for discharge from the hospital within 2 midnights of admission. The following factors support the patient status of inpatient.   " The patient's presenting symptoms  include patient admitted and presented with sepsis had a fever of 101.4.  He was tachycardic and hypotensive indicative of sepsis . The chronic co-morbidities include elderly male chronic kidney disease stage II and type 2 diabetes mellitus.   * I certify that at the point of admission it is my clinical judgment that the patient will require inpatient hospital care spanning beyond 2 midnights from the point of admission due to high intensity of service, high risk for further deterioration and high frequency of surveillance required.*    Family Communication: None at bedside  Disposition Plan: Home when stable   Consultants:  None  Procedures:  None  Antimicrobials:  Zosyn and vancomycin  DVT prophylaxis: SCD   Objective: Vitals:   08/14/18 2206 08/14/18 2245 08/15/18 0538 08/15/18 1530  BP: (!) 95/53 (!) 91/53 (!) 105/58 (!) 107/58  Pulse: (!) 107 (!) 106 88 78  Resp: 14  18   Temp: 98.8 F (37.1 C) 99.1 F (37.3 C) 98.4 F (36.9 C) 98.2 F (36.8 C)  TempSrc: Oral Oral Oral Oral  SpO2: 96% 97%  100%  Weight:      Height:        Intake/Output Summary (Last 24 hours) at 08/15/2018 1748 Last data filed at 08/15/2018 0500 Gross per 24 hour  Intake 0 ml  Output 300 ml  Net -300 ml   Filed Weights   08/14/18 2005  Weight: 74.8 kg   Body mass index is 21.77 kg/m.  Exam:  . General: 78 y.o. year-old male well developed well nourished in no acute  distress.  Alert and oriented x3. . Cardiovascular: Regular rate and rhythm with no rubs or gallops.  No thyromegaly or JVD noted.   Marland Kitchen Respiratory: Clear to auscultation with no wheezes or rales. Good inspiratory effort. . Abdomen: Soft nontender nondistended with normal bowel sounds x4 quadrants. . Musculoskeletal: No lower extremity edema. 2/4 pulses in all 4 extremities. . Skin: No ulcerative lesions noted or rashes, . Psychiatry: Mood is appropriate for condition and setting    Data Reviewed: CBC: Recent Labs   Lab 08/14/18 2012  WBC 5.1  NEUTROABS 4.5  HGB 9.8*  HCT 30.5*  MCV 92.1  PLT 585   Basic Metabolic Panel: Recent Labs  Lab 08/14/18 2012  NA 132*  K 4.3  CL 102  CO2 21*  GLUCOSE 241*  BUN 22  CREATININE 0.93  CALCIUM 8.4*   GFR: Estimated Creatinine Clearance: 69.3 mL/min (by C-G formula based on SCr of 0.93 mg/dL). Liver Function Tests: Recent Labs  Lab 08/14/18 2012  AST 38  ALT 10  ALKPHOS 63  BILITOT 0.4  PROT 6.2*  ALBUMIN 3.1*   No results for input(s): LIPASE, AMYLASE in the last 168 hours. No results for input(s): AMMONIA in the last 168 hours. Coagulation Profile: No results for input(s): INR, PROTIME in the last 168 hours. Cardiac Enzymes: No results for input(s): CKTOTAL, CKMB, CKMBINDEX, TROPONINI in the last 168 hours. BNP (last 3 results) No results for input(s): PROBNP in the last 8760 hours. HbA1C: No results for input(s): HGBA1C in the last 72 hours. CBG: Recent Labs  Lab 08/14/18 2324  GLUCAP 181*   Lipid Profile: No results for input(s): CHOL, HDL, LDLCALC, TRIG, CHOLHDL, LDLDIRECT in the last 72 hours. Thyroid Function Tests: No results for input(s): TSH, T4TOTAL, FREET4, T3FREE, THYROIDAB in the last 72 hours. Anemia Panel: No results for input(s): VITAMINB12, FOLATE, FERRITIN, TIBC, IRON, RETICCTPCT in the last 72 hours. Urine analysis:    Component Value Date/Time   COLORURINE YELLOW 08/14/2018 2140   APPEARANCEUR CLOUDY (A) 08/14/2018 2140   LABSPEC 1.016 08/14/2018 2140   PHURINE 6.0 08/14/2018 2140   GLUCOSEU >=500 (A) 08/14/2018 2140   HGBUR MODERATE (A) 08/14/2018 2140   BILIRUBINUR NEGATIVE 08/14/2018 2140   KETONESUR NEGATIVE 08/14/2018 2140   PROTEINUR 30 (A) 08/14/2018 2140   UROBILINOGEN 0.2 11/10/2011 0040   NITRITE NEGATIVE 08/14/2018 2140   LEUKOCYTESUR LARGE (A) 08/14/2018 2140   Sepsis Labs: @LABRCNTIP (procalcitonin:4,lacticidven:4)  ) Recent Results (from the past 240 hour(s))  Blood Culture  (routine x 2)     Status: None (Preliminary result)   Collection Time: 08/14/18  8:13 PM  Result Value Ref Range Status   Specimen Description BLOOD LEFT HAND  Final   Special Requests   Final    BOTTLES DRAWN AEROBIC AND ANAEROBIC Blood Culture adequate volume Performed at [facility], [address] [place], [place] [address]    Culture PENDING  Incomplete   Report Status PENDING  Incomplete  Blood Culture (routine x 2)     Status: None (Preliminary result)   Collection Time: 08/14/18  8:25 PM  Result Value Ref Range Status   Specimen Description BLOOD RIGHT HAND  Final   Special Requests   Final    BOTTLES DRAWN AEROBIC AND ANAEROBIC Blood Culture adequate volume Performed at [facility], [address], [place], [place] [address]    Culture PENDING  Incomplete   Report Status PENDING  Incomplete      Studies: Dg Chest Portable 1 View  Result Date: 08/14/2018 CLINICAL DATA:  Code sepsis, fever EXAM: PORTABLE CHEST 1 VIEW COMPARISON:  07/20/2018 FINDINGS: The heart size and mediastinal contours are within normal limits. Both lungs are clear. The visualized skeletal structures are unremarkable. Aortic atherosclerosis. IMPRESSION: No active disease. Electronically Signed   By: Donavan Foil M.D.   On: 08/14/2018 20:31    Scheduled Meds: . aspirin EC  81 mg Oral Daily  . calcium carbonate  1,250 mg Oral Q breakfast  . cholecalciferol  5,000 Units Oral Daily  . fluconazole  200 mg Oral Daily  . ibuprofen  600 mg Oral BID  . insulin glargine  8 Units Subcutaneous QHS  . metoprolol succinate  12.5 mg Oral Daily  . pantoprazole  40 mg Oral Daily  . polyethylene glycol  17 g Oral BID  . sodium chloride flush  3 mL Intravenous Q12H    Continuous Infusions: . sodium chloride    . piperacillin-tazobactam (ZOSYN)  IV 3.375 g (08/15/18 1212)  . vancomycin 1,000 mg (08/15/18 1158)     LOS: 0 days     Cristal Deer, MD [facility]  To reach me or the doctor on  call, go to: [email or web address] Password [facility]  08/15/2018, 5:48 PM

## 2018-08-16 ENCOUNTER — Inpatient Hospital Stay (HOSPITAL_COMMUNITY)
Admit: 2018-08-16 | Discharge: 2018-08-16 | Disposition: A | Discharge status: Home / Self Care | Payer: Medicare Other | Attending: Cardiovascular Disease | Admitting: Cardiovascular Disease

## 2018-08-16 DIAGNOSIS — I10 Essential (primary) hypertension: Secondary | ICD-10-CM

## 2018-08-16 DIAGNOSIS — I5189 Other ill-defined heart diseases: Secondary | ICD-10-CM

## 2018-08-16 DIAGNOSIS — A419 Sepsis, unspecified organism: Secondary | ICD-10-CM

## 2018-08-16 DIAGNOSIS — N390 Urinary tract infection, site not specified: Secondary | ICD-10-CM

## 2018-08-16 DIAGNOSIS — R509 Fever, unspecified: Secondary | ICD-10-CM

## 2018-08-16 DIAGNOSIS — I350 Nonrheumatic aortic (valve) stenosis: Secondary | ICD-10-CM

## 2018-08-16 DIAGNOSIS — C4A30 Merkel cell carcinoma of unspecified part of face: Secondary | ICD-10-CM

## 2018-08-16 LAB — CBC WITH DIFFERENTIAL/PLATELET
Abs Immature Granulocytes: 0.02 10*3/uL (ref 0.00–0.07)
Basophils Absolute: 0 10*3/uL (ref 0.0–0.1)
Basophils Relative: 1 %
Eosinophils Absolute: 0 10*3/uL (ref 0.0–0.5)
Eosinophils Relative: 1 %
HCT: 27.8 % — ABNORMAL LOW (ref 39.0–52.0)
Hemoglobin: 8.7 g/dL — ABNORMAL LOW (ref 13.0–17.0)
Immature Granulocytes: 1 %
LYMPHS ABS: 0.8 10*3/uL (ref 0.7–4.0)
Lymphocytes Relative: 18 %
MCH: 29.6 pg (ref 26.0–34.0)
MCHC: 31.3 g/dL (ref 30.0–36.0)
MCV: 94.6 fL (ref 80.0–100.0)
MONO ABS: 0.2 10*3/uL (ref 0.1–1.0)
Monocytes Relative: 6 %
Neutro Abs: 3.1 10*3/uL (ref 1.7–7.7)
Neutrophils Relative %: 73 %
Platelets: 106 10*3/uL — ABNORMAL LOW (ref 150–400)
RBC: 2.94 MIL/uL — ABNORMAL LOW (ref 4.22–5.81)
RDW: 15.7 % — ABNORMAL HIGH (ref 11.5–15.5)
WBC: 4.1 10*3/uL (ref 4.0–10.5)
nRBC: 0 % (ref 0.0–0.2)

## 2018-08-16 LAB — ECHOCARDIOGRAM COMPLETE
Height: 73 in
Weight: 2640 oz

## 2018-08-16 LAB — BASIC METABOLIC PANEL
Anion gap: 6 (ref 5–15)
BUN: 15 mg/dL (ref 8–23)
CALCIUM: 8.3 mg/dL — AB (ref 8.9–10.3)
CHLORIDE: 105 mmol/L (ref 98–111)
CO2: 23 mmol/L (ref 22–32)
CREATININE: 0.84 mg/dL (ref 0.61–1.24)
GFR calc Af Amer: >60 mL/min (ref >60)
GFR calc non Af Amer: >60 mL/min (ref >60)
Glucose, Bld: 149 mg/dL — ABNORMAL HIGH (ref 70–99)
Potassium: 3.9 mmol/L (ref 3.5–5.1)
Sodium: 134 mmol/L — ABNORMAL LOW (ref 135–145)

## 2018-08-16 LAB — GLUCOSE, CAPILLARY: Glucose-Capillary: 242 mg/dL — ABNORMAL HIGH (ref 70–99)

## 2018-08-16 MED ORDER — BENZONATATE 100 MG PO CAPS: 100.0000 mg | ORAL_CAPSULE | Freq: Three times a day (TID) | ORAL | Status: DC | PRN

## 2018-08-16 MED ORDER — CEFDINIR 300 MG PO CAPS
300.0000 mg | ORAL_CAPSULE | Freq: Two times a day (BID) | ORAL | Status: DC
  Administered 2018-08-16 – 2018-08-17 (×3): 300 mg via ORAL
  Filled 2018-08-16 (×3): qty 1

## 2018-08-16 NOTE — TOC Initial Note (Addendum)
Transition of Care [facility]) - Initial/Assessment Note    Patient Details  Name: Jesse Cordova MRN: [id number] Date of Birth: 1941-03-24  Transition of Care [facility]) CM/SW Contact:    Beautiful Pensyl, Chauncey Reading, RN Phone Number: 08/16/2018, 2:00 PM  Clinical Narrative:       Patient is from home. Has RW. Active with [facility]. Takes chemo every 3 weeks. Wife recently passed away.  Has sitters during the day and daughters are there at night. He is never alone. Plans to return home with resumption of home health.       PT eval pending.  Palliative consulted for [place].   Expected Discharge Plan: [place] Barriers to Discharge: No Barriers Identified   Patient Goals and CMS Choice Patient states their goals for this hospitalization and ongoing recovery are:: get back home and continue with home health and chemo      Expected Discharge Plan and Services Expected Discharge Plan: [place] Discharge Planning Services: CM Consult Post Acute Care Choice: Resumption of Svcs/PTA Provider Living arrangements for the past 2 months: [place] Agency: [place] ([place])  Prior Living Arrangements/Services Living arrangements for the past 2 months: Single Family Home Lives with:: Self, Adult Children, Other (Comment) Patient language and need for interpreter reviewed:: No Do you feel safe going back to the place where you live?: Yes      Need for Family Participation in Patient Care: Yes (Comment) Care giver support system in place?: Yes (comment) Current home services: Home PT, Home OT, Home RN, Actuary Criminal Activity/Legal Involvement Pertinent to Current Situation/Hospitalization: No - Comment as needed  Activities of Daily Living Home Assistive Devices/Equipment: Environmental consultant (specify type), Wheelchair, Bedside commode/3-in-1, Eyeglasses, Dentures (specify type) ADL Screening (condition at time of  admission) Patient's cognitive ability adequate to safely complete daily activities?: Yes Is the patient deaf or have difficulty hearing?: Yes Does the patient have difficulty seeing, even when wearing glasses/contacts?: No Does the patient have difficulty concentrating, remembering, or making decisions?: No Patient able to express need for assistance with ADLs?: Yes Does the patient have difficulty dressing or bathing?: Yes Independently performs ADLs?: No Communication: Independent Dressing (OT): Needs assistance Is this a change from baseline?: Pre-admission baseline Grooming: Needs assistance Is this a change from baseline?: Pre-admission baseline Feeding: Independent Bathing: Needs assistance Is this a change from baseline?: Pre-admission baseline Toileting: Independent In/Out Bed: Independent Walks in Home: Independent with device (comment) Does the patient have difficulty walking or climbing stairs?: Yes Weakness of Legs: Both Weakness of Arms/Hands: None  Permission Sought/Granted                  Emotional Assessment Appearance:: Appears stated age, Well-Groomed Attitude/Demeanor/Rapport: Engaged, Gracious Affect (typically observed): Accepting, Calm Orientation: : Oriented to Self, Oriented to Place, Oriented to  Time, Oriented to Situation Alcohol / Substance Use: Not Applicable Psych Involvement: No (comment)  Admission diagnosis:  Febrile illness [R50.9] Patient Active Problem List   Diagnosis Date Noted  . Acute lower UTI 08/16/2018  . Sepsis due to undetermined organism ([place]) 08/16/2018  . Febrile illness   . Fever 08/14/2018  . Sepsis ([place]) 07/20/2018  . CKD stage 2 due to type 2 diabetes mellitus ([place]) 07/06/2018  . Fracture of femoral neck, right, closed ([place]) 06/05/2018  . GERD (gastroesophageal reflux  disease) 06/05/2018  . CAD (coronary artery disease) 06/05/2018  . Chest pain 06/05/2018  . S/P TAVR (transcatheter aortic valve replacement)  12/22/2017  . Severe aortic stenosis 12/22/2017  . Merkel cell carcinoma of face ([place]) 02/18/2016  . Essential hypertension, benign 11/15/2012  . Hyperlipidemia 11/15/2012  . Controlled diabetes mellitus type 2 with complications ([place]) 55/97/4163  . 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 The score is based on dignosis, age, lab data, medications, orders, and past utilization.   Low:  0-14.9   Medium: 15-21.9   High: 22-29.9   Extreme: 30 and above       No flowsheet data found.

## 2018-08-16 NOTE — Plan of Care (Signed)
  Problem: Acute Rehab PT Goals(only PT should resolve) Goal: Patient Will Transfer Sit To/From Stand Outcome: Progressing Flowsheets (Taken 08/16/2018 1614) Patient will transfer sit to/from stand: with min guard assist Goal: Pt Will Transfer Bed To Chair/Chair To Bed Outcome: Progressing Flowsheets (Taken 08/16/2018 1614) Pt will Transfer Bed to Chair/Chair to Bed: with supervision Goal: Pt Will Ambulate 08/16/2018 1614 by Lonell Grandchild, PT Flowsheets (Taken 08/16/2018 1614) Pt will Ambulate: with supervision; 100 feet; with rolling walker 08/16/2018 1614 by Lonell Grandchild, PT Outcome: Progressing Flowsheets (Taken 08/16/2018 1614) Pt will Ambulate: with supervision   4:15 PM, 08/16/18 Lonell Grandchild, MPT Physical Therapist with [facility] [phone] office [phone] mobile phone

## 2018-08-16 NOTE — Telephone Encounter (Signed)
Verbal order given to [facility] at [facility]

## 2018-08-16 NOTE — Consult Note (Signed)
[facility] Consultation Oncology  Name: Jesse Cordova      MRN: [id number]    Location: A309/A309-01  Date: 08/16/2018 Time:4:50 PM   REFERRING PHYSICIAN: Dr. Carles Collet  REASON FOR CONSULT: Metastatic Merkel cell cancer   DIAGNOSIS: Fever in a patient receiving chemotherapy  HISTORY OF PRESENT ILLNESS: Jesse Cordova is a 78 year old pleasant white male who is seen in consultation today for history of metastatic Merkel cell cancer, receiving chemotherapy.  He last received Keytruda immunotherapy on 07/26/2018.  His chemotherapy was changed to carboplatin and etoposide on 08/05/2018.  He came to the hospital on 08/14/2018 with high fever.  He denied any other side effects from chemotherapy including nausea vomiting or diarrhea.  He was also feeling very weak.  He was started on broad-spectrum antibiotic with Zosyn and vancomycin.  A chest x-ray on admission did not reveal any abnormalities.  Patient has been afebrile since then.  Today his IV antibiotics were discontinued.  PAST MEDICAL HISTORY:   Past Medical History:  Diagnosis Date  . Adrenal nodule ([place]) 11/10/2011  . Arthritis   . Cancer ([place])    skin cancer  . Chronic low back pain   . Complication of anesthesia   . Diabetes mellitus   . GERD (gastroesophageal reflux disease)   . History of hip surgery 06/02/2018   right hip, right knee  . Hyperlipemia   . Hypertension   . Merkel cell carcinoma of face ([place]) 02/18/2016    Followed by [facility], underwent surgery now undergoing radiation September 2017  . PONV (postoperative nausea and vomiting)   . 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 Cortisone Other (See Comments)    swelling  . Tetanus Toxoids Other (See Comments)    Swelling, not specified  . Hydrocodone-Homatropine Nausea And Vomiting       MEDICATIONS: I have reviewed the patient's current medications.     PAST SURGICAL HISTORY Past Surgical History:  Procedure Laterality Date  . BIOPSY  01/16/2012   Procedure: BIOPSY;  Surgeon: Rogene Houston, MD;  [facility];  Service: Endoscopy;  Laterality: N/A;  . CATARACT EXTRACTION Right 7/06  . CATARACT EXTRACTION W/PHACO Left 01/05/2014   Procedure: CATARACT EXTRACTION PHACO AND INTRAOCULAR LENS PLACEMENT (IOC);  Surgeon: Tonny Branch, MD;  Location: [facility] ORS;  Service: Ophthalmology;  Laterality: Left;  CDE:15.98  . COLONOSCOPY  01/29/12   abnormal repeat in one year  . ESOPHAGOGASTRODUODENOSCOPY    . KNEE ARTHROSCOPY     2 right knee surgeries and 1 left knee surgery  . RIGHT/LEFT HEART CATH AND CORONARY ANGIOGRAPHY N/A 11/25/2017   Procedure: RIGHT/LEFT HEART CATH AND CORONARY ANGIOGRAPHY;  Surgeon: Burnell Blanks, MD;  Location: [facility] CV LAB;  Service: Cardiovascular;  Laterality: N/A;  . TEE WITHOUT CARDIOVERSION N/A 12/22/2017   Procedure: TRANSESOPHAGEAL ECHOCARDIOGRAM (TEE);  Surgeon: Burnell Blanks, MD;  Location: [facility];  Service: Open Heart Surgery;  Laterality: N/A;  . TONSILLECTOMY    . TRANSCATHETER AORTIC VALVE REPLACEMENT, TRANSFEMORAL N/A 12/22/2017   Procedure: TRANSCATHETER AORTIC VALVE REPLACEMENT, TRANSFEMORAL;  Surgeon: Burnell Blanks, MD;  Location: [facility];  Service: Open Heart Surgery;  Laterality: N/A;  . VASECTOMY      FAMILY HISTORY: Family History  Problem Relation Age of Onset  . Diabetes Sister   . Heart disease  Sister   . Atrial fibrillation Sister   . Heart disease Other   . Diabetes Other   . Depression Maternal Grandfather     SOCIAL HISTORY:  reports that he has never smoked. He has never used smokeless tobacco. He reports that he does not drink alcohol or use drugs.  PERFORMANCE STATUS: The patient's performance status is 2 - Symptomatic, <50% confined to bed  PHYSICAL EXAM: Most Recent Vital  Signs: Blood pressure 129/64, pulse 75, temperature 98.1 F (36.7 C), temperature source Oral, resp. rate 18, height 6\' 1"  (1.854 m), weight 165 lb (74.8 kg), SpO2 95 %. BP 129/64   Pulse 75   Temp 98.1 F (36.7 C) (Oral)   Resp 18   Ht 6\' 1"  (1.854 m)   Wt 165 lb (74.8 kg)   SpO2 95%   BMI 21.77 kg/m  General appearance: alert and cooperative Lungs: clear to auscultation bilaterally Heart: regular rate and rhythm Abdomen: soft, non-tender; bowel sounds normal; no masses,  no organomegaly Extremities: extremities normal, atraumatic, no cyanosis or edema Skin: Skin color, texture, turgor normal. No rashes or lesions Lymph nodes: Cervical, supraclavicular, and axillary nodes normal. Neurologic: Grossly normal  LABORATORY DATA:  Results for orders placed or performed during the hospital encounter of 08/14/18 (from the past 48 hour(s))  Lactic acid, plasma     Status: None   Collection Time: 08/14/18  8:12 PM  Result Value Ref Range   Lactic Acid, Venous 1.6 0.5 - 1.9 mmol/L    Comment: Performed at [facility], [address], [place], [place] [address]  Comprehensive metabolic panel     Status: Abnormal   Collection Time: 08/14/18  8:12 PM  Result Value Ref Range   Sodium 132 (L) 135 - 145 mmol/L   Potassium 4.3 3.5 - 5.1 mmol/L   Chloride 102 98 - 111 mmol/L   CO2 21 (L) 22 - 32 mmol/L   Glucose, Bld 241 (H) 70 - 99 mg/dL   BUN 22 8 - 23 mg/dL   Creatinine, Ser 0.93 0.61 - 1.24 mg/dL   Calcium 8.4 (L) 8.9 - 10.3 mg/dL   Total Protein 6.2 (L) 6.5 - 8.1 g/dL   Albumin 3.1 (L) 3.5 - 5.0 g/dL   AST 38 15 - 41 U/L   ALT 10 0 - 44 U/L   Alkaline Phosphatase 63 38 - 126 U/L   Total Bilirubin 0.4 0.3 - 1.2 mg/dL   GFR calc non Af Amer >60 >60 mL/min   GFR calc Af Amer >60 >60 mL/min   Anion gap 9 5 - 15    Comment: Performed at [facility], [address] [place], [place] [address]  CBC WITH DIFFERENTIAL     Status: Abnormal   Collection Time: 08/14/18  8:12 PM  Result  Value Ref Range   WBC 5.1 4.0 - 10.5 K/uL   RBC 3.31 (L) 4.22 - 5.81 MIL/uL   Hemoglobin 9.8 (L) 13.0 - 17.0 g/dL   HCT 30.5 (L) 39.0 - 52.0 %   MCV 92.1 80.0 - 100.0 fL   MCH 29.6 26.0 - 34.0 pg   MCHC 32.1 30.0 - 36.0 g/dL   RDW 15.3 11.5 - 15.5 %   Platelets 166 150 - 400 K/uL   nRBC 0.0 0.0 - 0.2 %   Neutrophils Relative % 88 %   Neutro Abs 4.5 1.7 - 7.7 K/uL   Lymphocytes Relative 6 %   Lymphs Abs 0.3 (L) 0.7 - 4.0 K/uL  Monocytes Relative 3 %   Monocytes Absolute 0.1 0.1 - 1.0 K/uL   Eosinophils Relative 1 %   Eosinophils Absolute 0.0 0.0 - 0.5 K/uL   Basophils Relative 1 %   Basophils Absolute 0.0 0.0 - 0.1 K/uL   Immature Granulocytes 1 %   Abs Immature Granulocytes 0.04 0.00 - 0.07 K/uL    Comment: Performed at [facility], [address], [place], [place] [address]  Blood Culture (routine x 2)     Status: None (Preliminary result)   Collection Time: 08/14/18  8:13 PM  Result Value Ref Range   Specimen Description BLOOD LEFT HAND    Special Requests      BOTTLES DRAWN AEROBIC AND ANAEROBIC Blood Culture adequate volume   Culture      NO GROWTH 2 DAYS Performed at [facility], [address], [place], [place] [address]    Report Status PENDING   Blood Culture (routine x 2)     Status: None (Preliminary result)   Collection Time: 08/14/18  8:25 PM  Result Value Ref Range   Specimen Description BLOOD RIGHT HAND    Special Requests      BOTTLES DRAWN AEROBIC AND ANAEROBIC Blood Culture adequate volume   Culture      NO GROWTH 2 DAYS Performed at [facility], [address] [place], [place] [address]    Report Status PENDING   Influenza panel by PCR (type A & B)     Status: None   Collection Time: 08/14/18  9:15 PM  Result Value Ref Range   Influenza A By PCR NEGATIVE NEGATIVE   Influenza B By PCR NEGATIVE NEGATIVE    Comment: (NOTE) The Xpert Xpress Flu assay is intended as an aid in the diagnosis of  influenza and should not be used as a sole basis for  treatment.  This  assay is FDA approved for nasopharyngeal swab specimens only. Nasal  washings and aspirates are unacceptable for Xpert Xpress Flu testing. Performed at [facility], [address], [place], [place] [address]   Urinalysis, Routine w reflex microscopic     Status: Abnormal   Collection Time: 08/14/18  9:40 PM  Result Value Ref Range   Color, Urine YELLOW YELLOW   APPearance CLOUDY (A) CLEAR   Specific Gravity, Urine 1.016 1.005 - 1.030   pH 6.0 5.0 - 8.0   Glucose, UA >=500 (A) NEGATIVE mg/dL   Hgb urine dipstick MODERATE (A) NEGATIVE   Bilirubin Urine NEGATIVE NEGATIVE   Ketones, ur NEGATIVE NEGATIVE mg/dL   Protein, ur 30 (A) NEGATIVE mg/dL   Nitrite NEGATIVE NEGATIVE   Leukocytes,Ua LARGE (A) NEGATIVE   RBC / HPF >50 (H) 0 - 5 RBC/hpf   WBC, UA >50 (H) 0 - 5 WBC/hpf   Bacteria, UA NONE SEEN NONE SEEN   Squamous Epithelial / LPF 0-5 0 - 5   WBC Clumps PRESENT    Mucus PRESENT    Budding Yeast PRESENT     Comment: Performed at [facility], [address], [place], [place] [address]  Lactic acid, plasma     Status: None   Collection Time: 08/14/18 10:13 PM  Result Value Ref Range   Lactic Acid, Venous 1.1 0.5 - 1.9 mmol/L    Comment: Performed at [facility], [address] [place], [place] [address]  Glucose, capillary     Status: Abnormal   Collection Time: 08/14/18 11:24 PM  Result Value Ref Range   Glucose-Capillary 181 (H) 70 -  99 mg/dL   Comment 1 Notify RN    Comment 2 Document in Chart   CBC with Differential/Platelet     Status: Abnormal   Collection Time: 08/16/18  5:23 AM  Result Value Ref Range   WBC 4.1 4.0 - 10.5 K/uL   RBC 2.94 (L) 4.22 - 5.81 MIL/uL   Hemoglobin 8.7 (L) 13.0 - 17.0 g/dL   HCT 27.8 (L) 39.0 - 52.0 %   MCV 94.6 80.0 - 100.0 fL   MCH 29.6 26.0 - 34.0 pg   MCHC 31.3 30.0 - 36.0 g/dL   RDW 15.7 (H) 11.5 - 15.5 %   Platelets 106 (L) 150 - 400 K/uL    Comment: PLATELET COUNT CONFIRMED BY SMEAR SPECIMEN CHECKED FOR  CLOTS Immature Platelet Fraction may be clinically indicated, consider ordering this additional test [id number]    nRBC 0.0 0.0 - 0.2 %   Neutrophils Relative % 73 %   Neutro Abs 3.1 1.7 - 7.7 K/uL   Lymphocytes Relative 18 %   Lymphs Abs 0.8 0.7 - 4.0 K/uL   Monocytes Relative 6 %   Monocytes Absolute 0.2 0.1 - 1.0 K/uL   Eosinophils Relative 1 %   Eosinophils Absolute 0.0 0.0 - 0.5 K/uL   Basophils Relative 1 %   Basophils Absolute 0.0 0.0 - 0.1 K/uL   Immature Granulocytes 1 %   Abs Immature Granulocytes 0.02 0.00 - 0.07 K/uL    Comment: Performed at [facility], [address], [place], [place] [address]  Basic metabolic panel     Status: Abnormal   Collection Time: 08/16/18  5:23 AM  Result Value Ref Range   Sodium 134 (L) 135 - 145 mmol/L   Potassium 3.9 3.5 - 5.1 mmol/L   Chloride 105 98 - 111 mmol/L   CO2 23 22 - 32 mmol/L   Glucose, Bld 149 (H) 70 - 99 mg/dL   BUN 15 8 - 23 mg/dL   Creatinine, Ser 0.84 0.61 - 1.24 mg/dL   Calcium 8.3 (L) 8.9 - 10.3 mg/dL   GFR calc non Af Amer >60 >60 mL/min   GFR calc Af Amer >60 >60 mL/min   Anion gap 6 5 - 15    Comment: Performed at [facility], [address] [place], [place] [address]      RADIOGRAPHY: Dg Chest Portable 1 View  Result Date: 08/14/2018 CLINICAL DATA:  Code sepsis, fever EXAM: PORTABLE CHEST 1 VIEW COMPARISON:  07/20/2018 FINDINGS: The heart size and mediastinal contours are within normal limits. Both lungs are clear. The visualized skeletal structures are unremarkable. Aortic atherosclerosis. IMPRESSION: No active disease. Electronically Signed   By: Donavan Foil M.D.   On: 08/14/2018 20:31         ASSESSMENT and PLAN:  1.  Febrile illness: - Admitted with a high fever with no localizing symptoms or signs.  Blood cultures have been negative so far.  He was started on broad-spectrum IV antibiotics which were discontinued today.  He has been afebrile since admission. - White count today is 4.1 with 73%  neutrophils.  He may be discharged home on p.o. antibiotic.  2.  Metastatic Merkel cell cancer: - He was started on carboplatin and etoposide first cycle on 08/05/2018 for metastatic Merkel cell cancer.  Patient noted that his right axillary nodule has decreased in size after the first cycle. - He will follow-up with his primary oncologist at [facility] upon discharge.  All questions were answered. The patient knows to call  the clinic with any problems, questions or concerns. We can certainly see the patient much sooner if necessary.    Derek Jack

## 2018-08-16 NOTE — Progress Notes (Signed)
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  Apparently, the patient had temperature up to 103.0 F at home.  The patient recently received a second cycle of chemotherapy on 08/06/2018 at [facility] consisting of carboplatin and etoposide.  Reviewed the medical record also shows that he has had increasing generalized weakness for the past several weeks.  His daughters state that he spends most of his day sitting in a recliner.  However, he has been noted that his oral intake is improved.  The patient himself denied any chest pain, shortness breath, nausea, vomiting, diarrhea, abdominal pain.  He denies any sore throat, headaches.  Upon presentation, patient was noted to have a temperature of 101.4 F.  He was tachycardic with soft blood pressures.  Oxygen saturation was 100% room air.  Lactic acid was 1.6.  Urinalysis showed greater than 50 WBC.  The patient was started on vancomycin and Zosyn empirically.  Assessment/Plan: SIRS -Presented with fever and tachycardia -Blood cultures negative to date -Likely urinary source with significant pyuria -Unfortunately urine culture was not obtained -Influenza PCR negative -Discontinue vancomycin and Zosyn -Start cefdinir  Hyponatremia -Secondary to volume depletion and poor solute intake -Improving with IV fluids  Essential hypertension -Blood pressures have remained soft -Continue metoprolol succinate 12.5 mg daily  Diabetes mellitus type 2 -Continue reduced dose Lantus -Hemoglobin A1c -NovoLog sliding scale  Coronary artery disease -No chest pain presently -Personally reviewed chest x-ray--no  consolidation or edema -Personally reviewed EKG--sinus rhythm, no ST-T wave changes  Deconditioning -PT evaluation  Thrombocytopenia -Secondary to chemotherapy -Monitor for signs of bleeding  Metastatic Merkel Cell Carcinoma -1/13-20; whole body PET/CT scan significant for an FDG-avid soft tissue 3.9 x 2-cm lesion in the anterior mediastinum, an up to 3.5-cm lymph node within the pericardial cavity, FDG-avid bilateral iliac chain lymph node, multiple foci of mesenteric/retroperitoneal implants (a 5.9 x 3.8-cm ill-defined peripancreatic soft tissue mass, a 2.7 x 2.9-cm FDG avid lesion in the anterior R mesentery, a 2 x 2.8-cm FDG-avid lesioon posterior to the spleen, a 2 x 3.4-cm FDG-avid lesion anterior to the L hepatic lobe).  -had recent pathologic fracture of right femur Jan 2020  Cancer-associated pain -Continue oxycodone 10mg  q6h PRN, ibuprofen 600 mg BID, Voltaren gel 4x/day PRN -Continue miralax daily for bowel regularity  Aortic Stenosis -s/p TAVR     Disposition Plan:   Home 08/17/18 if stable Family Communication:  No Family at bedside  Consultants:  none  Code Status:  FULL  DVT Prophylaxis:  SCDs   Procedures: As Listed in Progress Note Above  Antibiotics: vanc 3/14>>>3/16 Zosyn 3/14>>>3/16          Subjective: Patient denies fevers, chills, headache, chest pain, dyspnea, nausea, vomiting, diarrhea, abdominal pain, dysuria, hematuria, hematochezia, and melena.   Objective: Vitals:   08/15/18 1530 08/15/18 2138 08/16/18 0552 08/16/18 0933  BP: (!) 107/58 111/62 118/64 129/64  Pulse: 78 87 85 75  Resp:  18 18   Temp: 98.2 F (36.8 C) 98.4 F (36.9 C) 98.1 F (36.7 C)   TempSrc: Oral Oral Oral   SpO2: 100% 97% 95%   Weight:      Height:  Intake/Output Summary (Last 24 hours) at 08/16/2018 1330 Last data filed at 08/16/2018 2951 Gross per 24 hour  Intake 723.22 ml  Output 2700 ml  Net -1976.78 ml   Weight change:  Exam:    General:  Pt is alert, follows commands appropriately, not in acute distress  HEENT: No icterus, No thrush, No neck mass, [place]/AT  Cardiovascular: RRR, S1/S2, no rubs, no gallops  Respiratory: CTA bilaterally, no wheezing, no crackles, no rhonchi  Abdomen: Soft/+BS, non tender, non distended, no guarding  Extremities: No edema, No lymphangitis, No petechiae, No rashes, no synovitis   Data Reviewed: I have personally reviewed following labs and imaging studies Basic Metabolic Panel: Recent Labs  Lab 08/14/18 2012 08/16/18 0523  NA 132* 134*  K 4.3 3.9  CL 102 105  CO2 21* 23  GLUCOSE 241* 149*  BUN 22 15  CREATININE 0.93 0.84  CALCIUM 8.4* 8.3*   Liver Function Tests: Recent Labs  Lab 08/14/18 2012  AST 38  ALT 10  ALKPHOS 63  BILITOT 0.4  PROT 6.2*  ALBUMIN 3.1*   No results for input(s): LIPASE, AMYLASE in the last 168 hours. No results for input(s): AMMONIA in the last 168 hours. Coagulation Profile: No results for input(s): INR, PROTIME in the last 168 hours. CBC: Recent Labs  Lab 08/14/18 2012 08/16/18 0523  WBC 5.1 4.1  NEUTROABS 4.5 3.1  HGB 9.8* 8.7*  HCT 30.5* 27.8*  MCV 92.1 94.6  PLT 166 106*   Cardiac Enzymes: No results for input(s): CKTOTAL, CKMB, CKMBINDEX, TROPONINI in the last 168 hours. BNP: Invalid input(s): POCBNP CBG: Recent Labs  Lab 08/14/18 2324  GLUCAP 181*   HbA1C: No results for input(s): HGBA1C in the last 72 hours. Urine analysis:    Component Value Date/Time   COLORURINE YELLOW 08/14/2018 2140   APPEARANCEUR CLOUDY (A) 08/14/2018 2140   LABSPEC 1.016 08/14/2018 2140   PHURINE 6.0 08/14/2018 2140   GLUCOSEU >=500 (A) 08/14/2018 2140   HGBUR MODERATE (A) 08/14/2018 2140   BILIRUBINUR NEGATIVE 08/14/2018 2140   KETONESUR NEGATIVE 08/14/2018 2140   PROTEINUR 30 (A) 08/14/2018 2140   UROBILINOGEN 0.2 11/10/2011 0040   NITRITE NEGATIVE 08/14/2018 2140   LEUKOCYTESUR LARGE (A) 08/14/2018 2140   Sepsis Labs:  @LABRCNTIP (procalcitonin:4,lacticidven:4) ) Recent Results (from the past 240 hour(s))  Blood Culture (routine x 2)     Status: None (Preliminary result)   Collection Time: 08/14/18  8:13 PM  Result Value Ref Range Status   Specimen Description BLOOD LEFT HAND  Final   Special Requests   Final    BOTTLES DRAWN AEROBIC AND ANAEROBIC Blood Culture adequate volume   Culture   Final    NO GROWTH 2 DAYS Performed at [facility], [address] [place], [place] [address]    Report Status PENDING  Incomplete  Blood Culture (routine x 2)     Status: None (Preliminary result)   Collection Time: 08/14/18  8:25 PM  Result Value Ref Range Status   Specimen Description BLOOD RIGHT HAND  Final   Special Requests   Final    BOTTLES DRAWN AEROBIC AND ANAEROBIC Blood Culture adequate volume   Culture   Final    NO GROWTH 2 DAYS Performed at [facility], [address] [place], [place] [address]    Report Status PENDING  Incomplete     Scheduled Meds: . aspirin EC  81 mg Oral Daily  . calcium carbonate  1,250 mg Oral Q breakfast  . cefdinir  300 mg Oral Q12H  . cholecalciferol  5,000 Units Oral Daily  . fluconazole  200 mg Oral Daily  . ibuprofen  600 mg Oral BID  . insulin glargine  8 Units Subcutaneous QHS  . metoprolol succinate  12.5 mg Oral Daily  . pantoprazole  40 mg Oral Daily  . polyethylene glycol  17 g Oral BID  . sodium chloride flush  3 mL Intravenous Q12H   Continuous Infusions: . sodium chloride      Procedures/Studies: Dg Chest Portable 1 View  Result Date: 08/14/2018 CLINICAL DATA:  Code sepsis, fever EXAM: PORTABLE CHEST 1 VIEW COMPARISON:  07/20/2018 FINDINGS: The heart size and mediastinal contours are within normal limits. Both lungs are clear. The visualized skeletal structures are unremarkable. Aortic atherosclerosis. IMPRESSION: No active disease. Electronically Signed   By: Donavan Foil M.D.   On: 08/14/2018 20:31   Dg Chest Portable 1 View  Result Date:  07/20/2018 CLINICAL DATA:  Fever EXAM: PORTABLE CHEST 1 VIEW COMPARISON:  06/05/2018 FINDINGS: No focal consolidation or significant effusion. Normal cardiomediastinal silhouette with aortic atherosclerosis. No pneumothorax. IMPRESSION: No acute focal airspace disease. Electronically Signed   By: Donavan Foil M.D.   On: 07/20/2018 14:40    Orson Eva, DO  [facility] Pager [phone]  If 7PM-7AM, please contact night-coverage [email or web address] Password [email or web address] 08/16/2018, 1:30 PM   LOS: 1 day

## 2018-08-16 NOTE — Evaluation (Signed)
Physical Therapy Evaluation Patient Details Name: Jesse Cordova MRN: [id number] DOB: 05-Feb-1941 Today's Date: 08/16/2018   History of Present Illness  Jesse Cordova is a 78 y.o. male with medical history significant of merkels cancer, htn, on chemo at [facility] comes in with generalized weakness and fatigue with subjective fever at home.  No pain anywhere.  No n/v/d.  No cough or sob.  No rashes.  No flu contacts.  Found to be febrile here .  Flu pending.  ua pending.  Referred for admission for fever in chemo pt.    Clinical Impression  Patient functioning near baseline for functional mobility and gait, has difficulty for sit to stands due to stiffness right knee with limited flexion, once on feet able to safely ambulate in room/hallway without loss of balance.  Patient put back to bed after therapy.  Patient will benefit from continued physical therapy in hospital and recommended venue below to increase strength, balance, endurance for safe ADLs and gait.    Follow Up Recommendations Supervision for mobility/OOB;Supervision - Intermittent    Equipment Recommendations  None recommended by PT    Recommendations for Other Services       Precautions / Restrictions Precautions Precautions: Fall Restrictions Weight Bearing Restrictions: No      Mobility  Bed Mobility Overal bed mobility: Modified Independent             General bed mobility comments: increased time  Transfers Overall transfer level: Needs assistance Equipment used: Rolling walker (2 wheeled) Transfers: Sit to/from [facility] Sit to Stand: Min assist Stand pivot transfers: Min guard       General transfer comment: has diffiuclty with sit to stands due limited right knee flexion  Ambulation/Gait Ambulation/Gait assistance: Min guard Gait Distance (Feet): 75 Feet Assistive device: Rolling walker (2 wheeled) Gait Pattern/deviations: Decreased step length - right;Decreased step length -  left;Decreased stride length Gait velocity: decreased   General Gait Details: slightly labored cadence without loss of balance  Stairs            Wheelchair Mobility    Modified Rankin (Stroke Patients Only)       Balance Overall balance assessment: Needs assistance Sitting-balance support: Feet supported;No upper extremity supported Sitting balance-Leahy Scale: Good     Standing balance support: Bilateral upper extremity supported;During functional activity Standing balance-Leahy Scale: Fair Standing balance comment: using RW                             Pertinent Vitals/Pain Pain Assessment: No/denies pain    Home Living Family/patient expects to be discharged to:: Private residence Living Arrangements: Children Available Help at Discharge: Personal care attendant;Family Type of Home: House Home Access: Ramped entrance     Home Layout: Multi-level;Able to live on main level with bedroom/bathroom Home Equipment: Gilford Rile - 2 wheels;Wheelchair - manual;Cane - single point;Bedside commode;Walker - 4 wheels;Other (comment) Additional Comments: has adjustable bed    Prior Function Level of Independence: Needs assistance   Gait / Transfers Assistance Needed: household ambulator with supervision  ADL's / Homemaking Assistance Needed: 24/7 coverage assisted by family and paid attendants 8-9 hours/day         Hand Dominance        Extremity/Trunk Assessment   Upper Extremity Assessment Upper Extremity Assessment: Overall WFL for tasks assessed    Lower Extremity Assessment Lower Extremity Assessment: Generalized weakness    Cervical / Trunk Assessment Cervical / Trunk  Assessment: Normal  Communication   Communication: No difficulties  Cognition Arousal/Alertness: Awake/alert Behavior During Therapy: WFL for tasks assessed/performed Overall Cognitive Status: Within Functional Limits for tasks assessed                                         General Comments      Exercises     Assessment/Plan    PT Assessment Patient needs continued PT services  PT Problem List Decreased activity tolerance;Decreased balance;Decreased mobility;Decreased strength       PT Treatment Interventions Gait training;Therapeutic exercise;Functional mobility training;Therapeutic activities;Patient/family education    PT Goals (Current goals can be found in the Care Plan section)  Acute Rehab PT Goals Patient Stated Goal: return home with family and home aides to assist PT Goal Formulation: With patient Time For Goal Achievement: 08/21/18 Potential to Achieve Goals: Good    Frequency Min 3X/week   Barriers to discharge        Co-evaluation               AM-PAC PT "6 Clicks" Mobility  Outcome Measure Help needed turning from your back to your side while in a flat bed without using bedrails?: None Help needed moving from lying on your back to sitting on the side of a flat bed without using bedrails?: None Help needed moving to and from a bed to a chair (including a wheelchair)?: A Little Help needed standing up from a chair using your arms (e.g., wheelchair or bedside chair)?: A Little Help needed to walk in hospital room?: A Little Help needed climbing 3-5 steps with a railing? : A Lot 6 Click Score: 19    End of Session   Activity Tolerance: Patient tolerated treatment well Patient left: with call bell/phone within reach;in bed Nurse Communication: Mobility status PT Visit Diagnosis: Unsteadiness on feet (R26.81);Other abnormalities of gait and mobility (R26.89)    Time: 1532-1600 PT Time Calculation (min) (ACUTE ONLY): 28 min   Charges:   PT Evaluation $PT Eval Moderate Complexity: 1 Mod PT Treatments $Therapeutic Activity: 23-37 mins        4:13 PM, 08/16/18 Lonell Grandchild, MPT Physical Therapist with [facility] [phone] office [phone] mobile phone

## 2018-08-16 NOTE — Progress Notes (Signed)
*  PRELIMINARY RESULTS* Echocardiogram 2D Echocardiogram has been performed.  Leavy Cella 08/16/2018, 2:12 PM

## 2018-08-17 ENCOUNTER — Other Ambulatory Visit (HOSPITAL_COMMUNITY): Payer: Medicare Other

## 2018-08-17 LAB — BASIC METABOLIC PANEL
Anion gap: 8 (ref 5–15)
BUN: 12 mg/dL (ref 8–23)
CO2: 22 mmol/L (ref 22–32)
Calcium: 8.3 mg/dL — ABNORMAL LOW (ref 8.9–10.3)
Chloride: 104 mmol/L (ref 98–111)
Creatinine, Ser: 0.73 mg/dL (ref 0.61–1.24)
GFR calc Af Amer: >60 mL/min (ref >60)
GFR calc non Af Amer: >60 mL/min (ref >60)
Glucose, Bld: 146 mg/dL — ABNORMAL HIGH (ref 70–99)
Potassium: 3.7 mmol/L (ref 3.5–5.1)
Sodium: 134 mmol/L — ABNORMAL LOW (ref 135–145)

## 2018-08-17 LAB — CBC
HCT: 28.2 % — ABNORMAL LOW (ref 39.0–52.0)
Hemoglobin: 9.1 g/dL — ABNORMAL LOW (ref 13.0–17.0)
MCH: 30 pg (ref 26.0–34.0)
MCHC: 32.3 g/dL (ref 30.0–36.0)
MCV: 93.1 fL (ref 80.0–100.0)
Platelets: 114 10*3/uL — ABNORMAL LOW (ref 150–400)
RBC: 3.03 MIL/uL — ABNORMAL LOW (ref 4.22–5.81)
RDW: 15.7 % — ABNORMAL HIGH (ref 11.5–15.5)
WBC: 3 10*3/uL — ABNORMAL LOW (ref 4.0–10.5)
nRBC: 0 % (ref 0.0–0.2)

## 2018-08-17 LAB — GLUCOSE, CAPILLARY
Glucose-Capillary: 126 mg/dL — ABNORMAL HIGH (ref 70–99)
Glucose-Capillary: 144 mg/dL — ABNORMAL HIGH (ref 70–99)

## 2018-08-17 LAB — MAGNESIUM: MAGNESIUM: 1.7 mg/dL (ref 1.7–2.4)

## 2018-08-17 MED ORDER — MAGNESIUM SULFATE 2 GM/50ML IV SOLN
2.0000 g | Freq: Once | INTRAVENOUS | Status: AC
  Administered 2018-08-17: 2 g via INTRAVENOUS
  Filled 2018-08-17: qty 50

## 2018-08-17 MED ORDER — CEFDINIR 300 MG PO CAPS: 300.0000 mg | ORAL_CAPSULE | Freq: Two times a day (BID) | ORAL | 0 refills | Status: DC

## 2018-08-17 NOTE — Plan of Care (Signed)
  Problem: Education: Goal: Knowledge of General Education information will improve Description Including pain rating scale, medication(s)/side effects and non-pharmacologic comfort measures 08/17/2018 1640 by Rance Muir, RN Outcome: Adequate for Discharge 08/17/2018 0935 by Rance Muir, RN Outcome: Progressing   Problem: Health Behavior/Discharge Planning: Goal: Ability to manage health-related needs will improve 08/17/2018 1640 by Rance Muir, RN Outcome: Adequate for Discharge 08/17/2018 0935 by Rance Muir, RN Outcome: Progressing   Problem: Clinical Measurements: Goal: Ability to maintain clinical measurements within normal limits will improve 08/17/2018 1640 by Rance Muir, RN Outcome: Adequate for Discharge 08/17/2018 0935 by Rance Muir, RN Outcome: Progressing Goal: Will remain free from infection 08/17/2018 1640 by Rance Muir, RN Outcome: Adequate for Discharge 08/17/2018 0935 by Rance Muir, RN Outcome: Progressing Goal: Diagnostic test results will improve 08/17/2018 1640 by Rance Muir, RN Outcome: Adequate for Discharge 08/17/2018 0935 by Rance Muir, RN Outcome: Progressing Goal: Respiratory complications will improve 08/17/2018 1640 by Rance Muir, RN Outcome: Adequate for Discharge 08/17/2018 0935 by Rance Muir, RN Outcome: Progressing Goal: Cardiovascular complication will be avoided 08/17/2018 1640 by Rance Muir, RN Outcome: Adequate for Discharge 08/17/2018 0935 by Rance Muir, RN Outcome: Progressing   Problem: Activity: Goal: Risk for activity intolerance will decrease 08/17/2018 1640 by Rance Muir, RN Outcome: Adequate for Discharge 08/17/2018 0935 by Rance Muir, RN Outcome: Progressing   Problem: Nutrition: Goal: Adequate nutrition will be maintained 08/17/2018 1640 by Rance Muir, RN Outcome: Adequate for Discharge 08/17/2018 0935 by Rance Muir, RN Outcome: Progressing   Problem: Coping: Goal: Level of anxiety will decrease 08/17/2018 1640 by Rance Muir, RN Outcome: Adequate for  Discharge 08/17/2018 0935 by Rance Muir, RN Outcome: Progressing   Problem: Elimination: Goal: Will not experience complications related to bowel motility 08/17/2018 1640 by Rance Muir, RN Outcome: Adequate for Discharge 08/17/2018 0935 by Rance Muir, RN Outcome: Progressing Goal: Will not experience complications related to urinary retention 08/17/2018 1640 by Rance Muir, RN Outcome: Adequate for Discharge 08/17/2018 0935 by Rance Muir, RN Outcome: Progressing   Problem: Pain Managment: Goal: General experience of comfort will improve 08/17/2018 1640 by Rance Muir, RN Outcome: Adequate for Discharge 08/17/2018 0935 by Rance Muir, RN Outcome: Progressing   Problem: Safety: Goal: Ability to remain free from injury will improve 08/17/2018 1640 by Rance Muir, RN Outcome: Adequate for Discharge 08/17/2018 0935 by Rance Muir, RN Outcome: Progressing   Problem: Skin Integrity: Goal: Risk for impaired skin integrity will decrease 08/17/2018 1640 by Rance Muir, RN Outcome: Adequate for Discharge 08/17/2018 0935 by Rance Muir, RN Outcome: Progressing

## 2018-08-17 NOTE — Discharge Summary (Addendum)
Physician Discharge Summary  DRE GAMINO OVA:[id number] DOB: 30-Dec-1940 DOA: 08/14/2018  PCP: [name], MD  Admit date: 08/14/2018 Discharge date: 08/17/2018  Admitted From: Home Disposition:  Home   Recommendations for Outpatient Follow-up:  1. Follow up with PCP in 1-2 weeks 2. 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  Apparently, the patient had temperature up to 103.0 F at home.  The patient recently received a second cycle of chemotherapy on 08/06/2018 at [facility] consisting of carboplatin and etoposide.  Reviewed the medical record also shows that he has had increasing generalized weakness for the past several weeks.  His daughters state that he spends most of his day sitting in a recliner.  However, he has been noted that his oral intake is improved.  The patient himself denied any chest pain, shortness breath, nausea, vomiting, diarrhea, abdominal pain.  He denies any sore throat, headaches.  Upon presentation, patient was noted to have a temperature of 101.4 F.  He was tachycardic with soft blood pressures.  Oxygen saturation was 100% room air.  Lactic acid was 1.6.  Urinalysis showed greater than 50 WBC.  The patient was started on vancomycin and Zosyn empirically.  Unfortunately, urine cultures were not collected and tx remained empiric.  His IV abx were de-escalated to cefdinir and he remained afebrile and hemodynamically stable.  He will go home with cefdinir x 4 more days to complete one week  Discharge Diagnoses:  Sepsis -Presented with fever and  tachycardia -Blood cultures negative to date -Likely urinary source with significant pyuria -Unfortunately urine culture was not obtained -Influenza PCR negative -Discontinue vancomycin and Zosyn-->remained afebrile and hemodynamically stable -Start cefdinir x 4 more days to complete one week  Hyponatremia -Secondary to volume depletion and poor solute intake -Improving with IV fluids -Na 134 on day of d/c  Essential hypertension -Blood pressures have remained soft -Continue metoprolol succinate 12.5 mg daily  Diabetes mellitus type 2 -Continue reduced dose Lantus -03/18/18-Hemoglobin A1c--6.6 -NovoLog sliding scale  Coronary artery disease -No chest pain presently -Personally reviewed chest x-ray--no consolidation or edema -Personally reviewed EKG--sinus rhythm, no ST-T wave changes  Deconditioning -PT evaluation-->HHPT  Thrombocytopenia -Secondary to chemotherapy -Monitor for signs of bleeding -stable -platelets 114 on day of d/c  Metastatic Merkel Cell Carcinoma -1/13-20; whole body PET/CT scan significant for an FDG-avid soft tissue 3.9 x 2-cm lesion in the anterior mediastinum, an up to 3.5-cm lymph node within the pericardial cavity, FDG-avid bilateral iliac chain lymph node, multiple foci of mesenteric/retroperitoneal implants (a 5.9 x 3.8-cm ill-defined peripancreatic soft tissue mass, a 2.7 x 2.9-cm FDG avid lesion in the anterior R mesentery, a 2 x 2.8-cm FDG-avid lesioon posterior to the spleen, a 2 x 3.4-cm FDG-avid lesion anterior to the L hepatic lobe).  -had recent pathologic fracture of right femur Jan 2020  Cancer-associated pain -Continue oxycodone 10mg  q6h PRN, ibuprofen 600 mg BID, Voltaren gel 4x/day PRN -Continue miralax daily for bowel regularity  Aortic Stenosis -s/p TAVR -0/45/99--HFSF--SE 39-53%; +diastolic dysfunction; small pericardial effusion; mild MR; AV prosthesis is difficult to see well Peak and mean gradients through the valve  aree 14 and 7  mm Hg respectively.  Discharge Instructions   Allergies as of 08/17/2018      Reactions   Cortisone Other (See Comments)   swelling   Tetanus Toxoids Other (See Comments)   Swelling, not specified   Hydrocodone-homatropine Nausea And Vomiting      Medication List    STOP taking these medications   fluconazole 200 MG tablet Commonly known as:  Diflucan     TAKE these medications   acetaminophen 500 MG tablet Commonly known as:  TYLENOL Take 500 mg by mouth every 4 (four) hours as needed for mild pain or moderate pain.   aspirin EC 81 MG tablet Take 81 mg by mouth daily.   Calcium 600 600 MG Tabs tablet Generic drug:  calcium carbonate Take 1 tablet by mouth daily.   cefdinir 300 MG capsule Commonly known as:  OMNICEF Take 1 capsule (300 mg total) by mouth every 12 (twelve) hours.   denosumab 60 MG/ML Sosy injection Commonly known as:  PROLIA Inject 120 mg into the skin every 6 (six) months.   Dialyvite Vitamin D 5000 125 MCG (5000 UT) capsule Generic drug:  Cholecalciferol Take 5,000 Units by mouth daily.   diclofenac sodium 1 % Gel Commonly known as:  VOLTAREN Apply 1 application topically 4 (four) times daily.   feeding supplement (ENSURE ENLIVE) Liqd Take 237 mLs by mouth 2 (two) times daily between meals. What changed:  when to take this   ibuprofen 600 MG tablet Commonly known as:  ADVIL,MOTRIN Take 600 mg by mouth 2 (two) times daily.   insulin glargine 100 UNIT/ML injection Commonly known as:  Lantus Inject 0.08 mLs (8 Units total) into the skin at bedtime.   INV-pembrolizumab LCCC 1525 100 MG/4ML Soln Commonly known as:  KEYTRUDA Inject 200 mg into the vein every 21 ( twenty-one) days.   lovastatin 20 MG tablet Commonly known as:  MEVACOR Take 20 mg by mouth every evening.   metoprolol succinate 25 MG 24 hr tablet Commonly known as:  TOPROL-XL Take 12.5 mg by mouth daily.   nystatin 100000 UNIT/ML suspension Commonly known  as:  MYCOSTATIN One tsp swish and swallow four times per day for seven. What changed:    how much to take  how to take this  when to take this   ondansetron 8 MG tablet Commonly known as:  ZOFRAN Take 8 mg by mouth every 8 (eight) hours as needed for nausea or vomiting. Takes scheduled. If missed doses will be significant   Oxycodone HCl 10 MG Tabs 1 every 4 hours as needed severe pain caution drowsiness metastatic cancer What changed:    how much to take  how to take this  when to take this  reasons to take this   pantoprazole 40 MG tablet Commonly known as:  PROTONIX Take 40 mg by mouth daily.   polyethylene glycol packet Commonly known as:  MIRALAX / GLYCOLAX Take 17 g by mouth 2 (two) times daily.   prochlorperazine 10 MG tablet Commonly known as:  COMPAZINE Take 10 mg by mouth every 6 (six) hours as needed for nausea or vomiting.   SENTRY ADULT PO Take 1 tablet by mouth daily.   TYLENOL PM EXTRA STRENGTH PO Take 1 tablet by mouth at bedtime.      Follow-up Information    [facility] Follow up.   Why:  home health will be resumed Contact information: [address] [place] [phone]  Allergies  Allergen Reactions  . Cortisone Other (See Comments)    swelling  . Tetanus Toxoids Other (See Comments)    Swelling, not specified  . Hydrocodone-Homatropine Nausea And Vomiting    Consultations:  Heme/onc   Procedures/Studies: Dg Chest Portable 1 View  Result Date: 08/14/2018 CLINICAL DATA:  Code sepsis, fever EXAM: PORTABLE CHEST 1 VIEW COMPARISON:  07/20/2018 FINDINGS: The heart size and mediastinal contours are within normal limits. Both lungs are clear. The visualized skeletal structures are unremarkable. Aortic atherosclerosis. IMPRESSION: No active disease. Electronically Signed   By: Donavan Foil M.D.   On: 08/14/2018 20:31   Dg Chest Portable 1 View  Result Date: 07/20/2018 CLINICAL  DATA:  Fever EXAM: PORTABLE CHEST 1 VIEW COMPARISON:  06/05/2018 FINDINGS: No focal consolidation or significant effusion. Normal cardiomediastinal silhouette with aortic atherosclerosis. No pneumothorax. IMPRESSION: No acute focal airspace disease. Electronically Signed   By: Donavan Foil M.D.   On: 07/20/2018 14:40        Discharge Exam: Vitals:   08/16/18 2145 08/17/18 0526  BP: 125/66 (!) 126/91  Pulse: 96 87  Resp: 20 20  Temp: 99.6 F (37.6 C) 98.3 F (36.8 C)  SpO2: 97% 97%   Vitals:   08/16/18 0933 08/16/18 1300 08/16/18 2145 08/17/18 0526  BP: 129/64 118/63 125/66 (!) 126/91  Pulse: 75 76 96 87  Resp:  18 20 20   Temp:  98.6 F (37 C) 99.6 F (37.6 C) 98.3 F (36.8 C)  TempSrc:  Oral Oral Oral  SpO2:  95% 97% 97%  Weight:      Height:        General: Pt is alert, awake, not in acute distress Cardiovascular: RRR, S1/S2 +, no rubs, no gallops Respiratory: bibasilar crackles, no wheeze Abdominal: Soft, NT, ND, bowel sounds + Extremities: no edema, no cyanosis   The results of significant diagnostics from this hospitalization (including imaging, microbiology, ancillary and laboratory) are listed below for reference.    Significant Diagnostic Studies: Dg Chest Portable 1 View  Result Date: 08/14/2018 CLINICAL DATA:  Code sepsis, fever EXAM: PORTABLE CHEST 1 VIEW COMPARISON:  07/20/2018 FINDINGS: The heart size and mediastinal contours are within normal limits. Both lungs are clear. The visualized skeletal structures are unremarkable. Aortic atherosclerosis. IMPRESSION: No active disease. Electronically Signed   By: Donavan Foil M.D.   On: 08/14/2018 20:31   Dg Chest Portable 1 View  Result Date: 07/20/2018 CLINICAL DATA:  Fever EXAM: PORTABLE CHEST 1 VIEW COMPARISON:  06/05/2018 FINDINGS: No focal consolidation or significant effusion. Normal cardiomediastinal silhouette with aortic atherosclerosis. No pneumothorax. IMPRESSION: No acute focal airspace disease.  Electronically Signed   By: Donavan Foil M.D.   On: 07/20/2018 14:40     Microbiology: Recent Results (from the past 240 hour(s))  Blood Culture (routine x 2)     Status: None (Preliminary result)   Collection Time: 08/14/18  8:13 PM  Result Value Ref Range Status   Specimen Description BLOOD LEFT HAND  Final   Special Requests   Final    BOTTLES DRAWN AEROBIC AND ANAEROBIC Blood Culture adequate volume   Culture   Final    NO GROWTH 3 DAYS Performed at [facility], [address] [place], [place] [address]    Report Status PENDING  Incomplete  Blood Culture (routine x 2)     Status: None (Preliminary result)   Collection Time: 08/14/18  8:25 PM  Result Value Ref Range Status   Specimen Description BLOOD RIGHT  HAND  Final   Special Requests   Final    BOTTLES DRAWN AEROBIC AND ANAEROBIC Blood Culture adequate volume   Culture   Final    NO GROWTH 3 DAYS Performed at [facility], [address] [place], [place] [address]    Report Status PENDING  Incomplete     Labs: Basic Metabolic Panel: Recent Labs  Lab 08/14/18 2012 08/16/18 0523 08/17/18 0429  NA 132* 134* 134*  K 4.3 3.9 3.7  CL 102 105 104  CO2 21* 23 22  GLUCOSE 241* 149* 146*  BUN 22 15 12   CREATININE 0.93 0.84 0.73  CALCIUM 8.4* 8.3* 8.3*  MG  --   --  1.7   Liver Function Tests: Recent Labs  Lab 08/14/18 2012  AST 38  ALT 10  ALKPHOS 63  BILITOT 0.4  PROT 6.2*  ALBUMIN 3.1*   No results for input(s): LIPASE, AMYLASE in the last 168 hours. No results for input(s): AMMONIA in the last 168 hours. CBC: Recent Labs  Lab 08/14/18 2012 08/16/18 0523 08/17/18 0429  WBC 5.1 4.1 3.0*  NEUTROABS 4.5 3.1  --   HGB 9.8* 8.7* 9.1*  HCT 30.5* 27.8* 28.2*  MCV 92.1 94.6 93.1  PLT 166 106* 114*   Cardiac Enzymes: No results for input(s): CKTOTAL, CKMB, CKMBINDEX, TROPONINI in the last 168 hours. BNP: Invalid input(s): POCBNP CBG: Recent Labs  Lab 08/14/18 2324 08/16/18 2225 08/17/18 0729  08/17/18 1047  GLUCAP 181* 242* 126* 144*    Time coordinating discharge:  36 minutes  Signed:  Orson Eva, DO [facility] Pager: [phone] 08/17/2018, 11:18 AM

## 2018-08-17 NOTE — Care Management Important Message (Signed)
Important Message  Patient Details  Name: Jesse Cordova MRN: [id number] Date of Birth: 1940/08/16   Medicare Important Message Given:  Yes    Saman Umstead, Chauncey Reading, RN 08/17/2018, 11:35 AM

## 2018-08-17 NOTE — Discharge Instructions (Signed)
Fever, Adult         A fever is an increase in your body's temperature. It often means a temperature of 100.4F (38C) or higher. Brief mild or moderate fevers often have no long-term effects. They often do not need treatment. Moderate or high fevers may make you feel uncomfortable. Sometimes, they can be a sign of a serious illness or disease. A fever that keeps coming back or that lasts a long time may cause you to lose water in your body (get dehydrated).  You can take your temperature with a thermometer to see if you have a fever. Temperature can change with:   Age.   Time of day.   Where the thermometer is put in the body. Readings may vary when the thermometer is put:  ? In the mouth (oral).  ? In the butt (rectal).  ? In the ear (tympanic).  ? Under the arm (axillary).  ? On the forehead (temporal).  Follow these instructions at home:  Medicines   Take over-the-counter and prescription medicines only as told by your doctor. Follow the dosing instructions carefully.   If you were prescribed an antibiotic medicine, take it as told by your doctor. Do not stop taking it even if you start to feel better.  General instructions   Watch for any changes in your symptoms. Tell your doctor about them.   Rest as needed.   Drink enough fluid to keep your pee (urine) pale yellow.   Sponge yourself or bathe with room-temperature water as needed. This helps to lower your body temperature. Do not use ice water.   Do not use too many blankets or wear clothes that are too heavy.   If your fever was caused by an infection that spreads from person to person (is contagious), such as a cold or the flu:  ? You should stay home from work and public places for at least 24 hours after your fever is gone.  ? Your fever should be gone for at least 24 hours without the need to use medicines.  Contact a doctor if:   You throw up (vomit).   You cannot eat or drink without throwing up.   You have watery poop (diarrhea).   It  hurts when you pee.   Your symptoms do not get better with treatment.   You have new symptoms.   You feel very weak.  Get help right away if:   You are short of breath or have trouble breathing.   You are dizzy or you pass out (faint).   You feel mixed up (confused).   You have signs of not having enough water in your body, such as:  ? Dark pee, very little pee, or no pee.  ? Cracked lips.  ? Dry mouth.  ? Sunken eyes.  ? Sleepiness.  ? Weakness.   You have very bad pain in your belly (abdomen).   You keep throwing up or having watery poop.   You have a rash on your skin.   Your symptoms get worse all of a sudden.  Summary   A fever is an increase in your body's temperature. It often means a temperature of 100.4F (38C) or higher.   Watch for any changes in your symptoms. Tell your doctor about them.   Take all medicines only as told by your doctor.   Do not go to work or other public places if your fever was caused by an illness that can spread   to other people.   Get help right away if you have signs that you do not have enough water in your body.  This information is not intended to replace advice given to you by your health care provider. Make sure you discuss any questions you have with your health care provider.  Document Released: 02/26/2008 Document Revised: 11/02/2017 Document Reviewed: 11/02/2017  Elsevier Interactive Patient Education  2019 [employer]

## 2018-08-17 NOTE — Plan of Care (Signed)

## 2018-08-18 ENCOUNTER — Telehealth: Payer: Self-pay | Admitting: Family Medicine

## 2018-08-18 ENCOUNTER — Telehealth: Payer: Self-pay | Admitting: *Deleted

## 2018-08-18 ENCOUNTER — Other Ambulatory Visit: Payer: Self-pay | Admitting: Family Medicine

## 2018-08-18 NOTE — Telephone Encounter (Signed)
Pt right out of hospital yesterday for febrile illness and granddaughter states temp at 2am this morning was 104. Gave tylenol and it came down to 99. Having cough and wheezing. Consult with dr Nicki Reaper and he advised to go back to ED. stacey notified and she states she will get him back to ED.

## 2018-08-18 NOTE — Telephone Encounter (Signed)
Homehealth nurse Verdis Frederickson needing verbal orders to continue visiting patient at his home. [phone]

## 2018-08-18 NOTE — Telephone Encounter (Signed)
Verbal orders given on voice mail.

## 2018-08-18 NOTE — Telephone Encounter (Signed)
stacey called back and states he is refusing to go to ED. His home health nurse is coming in at 22 today and he just wants to wait to see what she says. Fever is down and his breathing is stable per stacey. Wanted to ask dr Nicki Reaper if he thought that was ok. I told stacey that dr scott's recommendation was to go to ED.

## 2018-08-18 NOTE — Telephone Encounter (Signed)
Discussed with pt's [name]. She verbalized understanding.

## 2018-08-18 NOTE — Telephone Encounter (Signed)
May have ongoing orders to visit patient at home evaluate and treat

## 2018-08-18 NOTE — Telephone Encounter (Signed)
This type of situation indicates there is a possibility he could have a nosocomial pneumonia that is related into his chronic illness as well as recent hospitalization  It is because of the high fever during the night along with the coughing wheezing and frail health that I feel he needs to go to the ER  It is certainly up to them if they decide to wait till the home health nurse comes at lunchtime but I do not think it would be wise to not have this looked at Therefore I do recommend ER visit

## 2018-08-19 ENCOUNTER — Other Ambulatory Visit: Payer: Self-pay | Admitting: Family Medicine

## 2018-08-19 LAB — CULTURE, BLOOD (ROUTINE X 2)
CULTURE: NO GROWTH
Culture: NO GROWTH
Special Requests: ADEQUATE
Special Requests: ADEQUATE

## 2018-08-19 NOTE — Telephone Encounter (Signed)
Echo results given to patients granddaughter Marcille Buffy

## 2018-08-20 ENCOUNTER — Telehealth: Payer: Self-pay | Admitting: Family Medicine

## 2018-08-20 ENCOUNTER — Other Ambulatory Visit (HOSPITAL_COMMUNITY): Payer: Medicare Other

## 2018-08-20 NOTE — Telephone Encounter (Signed)
error 

## 2018-08-30 ENCOUNTER — Telehealth: Payer: Self-pay | Admitting: Family Medicine

## 2018-08-30 ENCOUNTER — Telehealth: Payer: Self-pay | Admitting: *Deleted

## 2018-08-30 NOTE — Telephone Encounter (Signed)
Please go ahead and give the verbal order for this

## 2018-08-30 NOTE — Telephone Encounter (Signed)
Verbal order given to shannon.

## 2018-08-30 NOTE — Telephone Encounter (Signed)
STacey called because oncologist recommended pt go to ED today due to running fever over weekend but pt is not wanting to go.  She states no fever today but he is taking scheduled ibuprofen 600mg  one bid and tylenol one tid. Temp now is 97. Over weekend around 100 but did get up to 101 was the highest. Not eating but one meal a day. Home health did bring out fluids yesterday and gave them to pt. He is feeling tired today. No cough, no sob. stacey states you were coming out for a home visit tomorrow and understands if you dont want to come out now since he is having fever.

## 2018-08-30 NOTE — Telephone Encounter (Signed)
[facility] needs [facility] order to continue home care, once a week for 5 weeks.  Please call: [phone]

## 2018-08-31 ENCOUNTER — Other Ambulatory Visit: Payer: Self-pay

## 2018-08-31 ENCOUNTER — Ambulatory Visit: Payer: Medicare Other | Admitting: Family Medicine

## 2018-08-31 DIAGNOSIS — R634 Abnormal weight loss: Secondary | ICD-10-CM

## 2018-08-31 DIAGNOSIS — C7B1 Secondary Merkel cell carcinoma: Secondary | ICD-10-CM

## 2018-08-31 NOTE — Progress Notes (Signed)
Visit-home visit Patient has underlying cancer Recent fever a few days ago No cough no wheezing no vomiting or diarrhea.  No dysuria.  Fever is gone away.  Family did not want to go to the ER because of coronavirus They are limiting who is around him They are doing chemotherapy at [facility] He will be following up with them in the near future  Patient is afebrile no respiratory distress lungs are clear no crackles heart is regular no murmurs extremities skin warm dry no swelling in the lower extremities his upper thigh on the right side somewhat enlarged but no edema with it I do not find evidence of any type of sepsis or infection It is difficult for the patient to maintain his weight he was encouraged to try to eat as healthy as possible. We will follow-up this patient in approximately 6 weeks sooner if any problems Patient is losing some weight related to just poor appetite nothing seems to taste good for him

## 2018-09-01 ENCOUNTER — Ambulatory Visit: Payer: Medicare Other | Admitting: Family Medicine

## 2018-09-01 ENCOUNTER — Encounter: Payer: Self-pay | Admitting: Family Medicine

## 2018-09-01 NOTE — Telephone Encounter (Signed)
I did do a home visit for this patient

## 2018-09-07 DIAGNOSIS — E119 Type 2 diabetes mellitus without complications: Secondary | ICD-10-CM | POA: Diagnosis not present

## 2018-09-07 DIAGNOSIS — C7B1 Secondary Merkel cell carcinoma: Secondary | ICD-10-CM | POA: Diagnosis not present

## 2018-09-07 DIAGNOSIS — I10 Essential (primary) hypertension: Secondary | ICD-10-CM | POA: Diagnosis not present

## 2018-09-10 ENCOUNTER — Other Ambulatory Visit: Payer: Self-pay

## 2018-09-10 ENCOUNTER — Encounter (HOSPITAL_COMMUNITY)
Admission: EM | Disposition: A | Discharge status: Hospice | Payer: Self-pay | Source: Home / Self Care | Attending: Internal Medicine

## 2018-09-10 ENCOUNTER — Inpatient Hospital Stay (HOSPITAL_COMMUNITY)
Admission: EM | Admit: 2018-09-10 | Discharge: 2018-09-15 | DRG: 314 | Disposition: A | Discharge status: Hospice | Payer: Medicare Other | Attending: Internal Medicine | Admitting: Internal Medicine

## 2018-09-10 ENCOUNTER — Emergency Department (HOSPITAL_COMMUNITY): Payer: Medicare Other

## 2018-09-10 ENCOUNTER — Inpatient Hospital Stay (HOSPITAL_COMMUNITY): Payer: Medicare Other

## 2018-09-10 ENCOUNTER — Other Ambulatory Visit (HOSPITAL_COMMUNITY): Payer: Self-pay

## 2018-09-10 ENCOUNTER — Encounter (HOSPITAL_COMMUNITY): Payer: Self-pay

## 2018-09-10 DIAGNOSIS — L8990 Pressure ulcer of unspecified site, unspecified stage: Secondary | ICD-10-CM

## 2018-09-10 DIAGNOSIS — I314 Cardiac tamponade: Secondary | ICD-10-CM | POA: Diagnosis present

## 2018-09-10 DIAGNOSIS — Z79899 Other long term (current) drug therapy: Secondary | ICD-10-CM

## 2018-09-10 DIAGNOSIS — J910 Malignant pleural effusion: Secondary | ICD-10-CM | POA: Diagnosis present

## 2018-09-10 DIAGNOSIS — C7951 Secondary malignant neoplasm of bone: Secondary | ICD-10-CM | POA: Diagnosis present

## 2018-09-10 DIAGNOSIS — E1122 Type 2 diabetes mellitus with diabetic chronic kidney disease: Secondary | ICD-10-CM | POA: Diagnosis present

## 2018-09-10 DIAGNOSIS — I480 Paroxysmal atrial fibrillation: Secondary | ICD-10-CM | POA: Diagnosis not present

## 2018-09-10 DIAGNOSIS — E872 Acidosis, unspecified: Secondary | ICD-10-CM

## 2018-09-10 DIAGNOSIS — Z66 Do not resuscitate: Secondary | ICD-10-CM | POA: Diagnosis not present

## 2018-09-10 DIAGNOSIS — R570 Cardiogenic shock: Secondary | ICD-10-CM | POA: Diagnosis present

## 2018-09-10 DIAGNOSIS — R0603 Acute respiratory distress: Secondary | ICD-10-CM | POA: Diagnosis present

## 2018-09-10 DIAGNOSIS — I313 Pericardial effusion (noninflammatory): Principal | ICD-10-CM | POA: Diagnosis present

## 2018-09-10 DIAGNOSIS — N182 Chronic kidney disease, stage 2 (mild): Secondary | ICD-10-CM | POA: Diagnosis present

## 2018-09-10 DIAGNOSIS — C4A30 Merkel cell carcinoma of unspecified part of face: Secondary | ICD-10-CM

## 2018-09-10 DIAGNOSIS — Z953 Presence of xenogenic heart valve: Secondary | ICD-10-CM | POA: Diagnosis not present

## 2018-09-10 DIAGNOSIS — C4A9 Merkel cell carcinoma, unspecified: Secondary | ICD-10-CM | POA: Diagnosis present

## 2018-09-10 DIAGNOSIS — J942 Hemothorax: Secondary | ICD-10-CM | POA: Diagnosis not present

## 2018-09-10 DIAGNOSIS — I2510 Atherosclerotic heart disease of native coronary artery without angina pectoris: Secondary | ICD-10-CM | POA: Diagnosis present

## 2018-09-10 DIAGNOSIS — Z923 Personal history of irradiation: Secondary | ICD-10-CM | POA: Diagnosis not present

## 2018-09-10 DIAGNOSIS — Z9221 Personal history of antineoplastic chemotherapy: Secondary | ICD-10-CM | POA: Diagnosis not present

## 2018-09-10 DIAGNOSIS — Z833 Family history of diabetes mellitus: Secondary | ICD-10-CM | POA: Diagnosis not present

## 2018-09-10 DIAGNOSIS — I10 Essential (primary) hypertension: Secondary | ICD-10-CM | POA: Diagnosis present

## 2018-09-10 DIAGNOSIS — Z7952 Long term (current) use of systemic steroids: Secondary | ICD-10-CM

## 2018-09-10 DIAGNOSIS — I3131 Malignant pericardial effusion in diseases classified elsewhere: Secondary | ICD-10-CM

## 2018-09-10 DIAGNOSIS — K219 Gastro-esophageal reflux disease without esophagitis: Secondary | ICD-10-CM | POA: Diagnosis present

## 2018-09-10 DIAGNOSIS — I3139 Other pericardial effusion (noninflammatory): Secondary | ICD-10-CM

## 2018-09-10 DIAGNOSIS — Z7982 Long term (current) use of aspirin: Secondary | ICD-10-CM

## 2018-09-10 DIAGNOSIS — R5381 Other malaise: Secondary | ICD-10-CM | POA: Diagnosis present

## 2018-09-10 DIAGNOSIS — G8929 Other chronic pain: Secondary | ICD-10-CM | POA: Diagnosis present

## 2018-09-10 DIAGNOSIS — Z794 Long term (current) use of insulin: Secondary | ICD-10-CM

## 2018-09-10 DIAGNOSIS — Z8249 Family history of ischemic heart disease and other diseases of the circulatory system: Secondary | ICD-10-CM

## 2018-09-10 DIAGNOSIS — N179 Acute kidney failure, unspecified: Secondary | ICD-10-CM | POA: Diagnosis present

## 2018-09-10 DIAGNOSIS — E785 Hyperlipidemia, unspecified: Secondary | ICD-10-CM | POA: Diagnosis present

## 2018-09-10 DIAGNOSIS — Z515 Encounter for palliative care: Secondary | ICD-10-CM | POA: Diagnosis not present

## 2018-09-10 DIAGNOSIS — C801 Malignant (primary) neoplasm, unspecified: Secondary | ICD-10-CM | POA: Diagnosis not present

## 2018-09-10 DIAGNOSIS — Z9689 Presence of other specified functional implants: Secondary | ICD-10-CM

## 2018-09-10 HISTORY — PX: PERICARDIOCENTESIS: CATH118255

## 2018-09-10 LAB — CBC WITH DIFFERENTIAL/PLATELET
Abs Immature Granulocytes: 0.16 10*3/uL — ABNORMAL HIGH (ref 0.00–0.07)
Basophils Absolute: 0.1 10*3/uL (ref 0.0–0.1)
Basophils Relative: 0 %
Eosinophils Absolute: 0 10*3/uL (ref 0.0–0.5)
Eosinophils Relative: 0 %
HCT: 32.5 % — ABNORMAL LOW (ref 39.0–52.0)
Hemoglobin: 9.9 g/dL — ABNORMAL LOW (ref 13.0–17.0)
Immature Granulocytes: 1 %
Lymphocytes Relative: 18 %
Lymphs Abs: 2.7 10*3/uL (ref 0.7–4.0)
MCH: 30.4 pg (ref 26.0–34.0)
MCHC: 30.5 g/dL (ref 30.0–36.0)
MCV: 99.7 fL (ref 80.0–100.0)
Monocytes Absolute: 1.4 10*3/uL — ABNORMAL HIGH (ref 0.1–1.0)
Monocytes Relative: 10 %
Neutro Abs: 10.7 10*3/uL — ABNORMAL HIGH (ref 1.7–7.7)
Neutrophils Relative %: 71 %
Platelets: 235 10*3/uL (ref 150–400)
RBC: 3.26 MIL/uL — ABNORMAL LOW (ref 4.22–5.81)
RDW: 17.5 % — ABNORMAL HIGH (ref 11.5–15.5)
WBC: 15 10*3/uL — ABNORMAL HIGH (ref 4.0–10.5)
nRBC: 0 % (ref 0.0–0.2)

## 2018-09-10 LAB — TYPE AND SCREEN
ABO/RH(D): A POS
Antibody Screen: NEGATIVE

## 2018-09-10 LAB — BLOOD GAS, ARTERIAL
Acid-base deficit: 17.8 mmol/L — ABNORMAL HIGH (ref 0.0–2.0)
Bicarbonate: 11.7 mmol/L — ABNORMAL LOW (ref 20.0–28.0)
FIO2: 100
O2 Saturation: 99.2 %
Patient temperature: 37
pCO2 arterial: <19 mmHg — CL (ref 32.0–48.0)
pH, Arterial: 7.345 — ABNORMAL LOW (ref 7.350–7.450)
pO2, Arterial: 305 mmHg — ABNORMAL HIGH (ref 83.0–108.0)

## 2018-09-10 LAB — URINALYSIS, ROUTINE W REFLEX MICROSCOPIC
Bilirubin Urine: NEGATIVE
Glucose, UA: 50 mg/dL — AB
Hgb urine dipstick: NEGATIVE
Ketones, ur: NEGATIVE mg/dL
Leukocytes,Ua: NEGATIVE
Nitrite: NEGATIVE
Protein, ur: 30 mg/dL — AB
Specific Gravity, Urine: 1.016 (ref 1.005–1.030)
pH: 5 (ref 5.0–8.0)

## 2018-09-10 LAB — GRAM STAIN

## 2018-09-10 LAB — BASIC METABOLIC PANEL
Anion gap: >20 — ABNORMAL HIGH (ref 5–15)
Anion gap: 15 (ref 5–15)
BUN: 45 mg/dL — ABNORMAL HIGH (ref 8–23)
BUN: 43 mg/dL — ABNORMAL HIGH (ref 8–23)
CO2: 11 mmol/L — ABNORMAL LOW (ref 22–32)
CO2: 17 mmol/L — ABNORMAL LOW (ref 22–32)
Calcium: 8.9 mg/dL (ref 8.9–10.3)
Calcium: 8.4 mg/dL — ABNORMAL LOW (ref 8.9–10.3)
Chloride: 102 mmol/L (ref 98–111)
Chloride: 102 mmol/L (ref 98–111)
Creatinine, Ser: 4.64 mg/dL — ABNORMAL HIGH (ref 0.61–1.24)
Creatinine, Ser: 4.27 mg/dL — ABNORMAL HIGH (ref 0.61–1.24)
GFR calc Af Amer: 13 mL/min — ABNORMAL LOW (ref >60)
GFR calc Af Amer: 14 mL/min — ABNORMAL LOW (ref >60)
GFR calc non Af Amer: 11 mL/min — ABNORMAL LOW (ref >60)
GFR calc non Af Amer: 12 mL/min — ABNORMAL LOW (ref >60)
Glucose, Bld: 314 mg/dL — ABNORMAL HIGH (ref 70–99)
Glucose, Bld: 264 mg/dL — ABNORMAL HIGH (ref 70–99)
Potassium: 5.1 mmol/L (ref 3.5–5.1)
Potassium: 4.9 mmol/L (ref 3.5–5.1)
Sodium: 134 mmol/L — ABNORMAL LOW (ref 135–145)
Sodium: 134 mmol/L — ABNORMAL LOW (ref 135–145)

## 2018-09-10 LAB — POCT I-STAT 7, (LYTES, BLD GAS, ICA,H+H)
Acid-base deficit: 9 mmol/L — ABNORMAL HIGH (ref 0.0–2.0)
Bicarbonate: 14.5 mmol/L — ABNORMAL LOW (ref 20.0–28.0)
Calcium, Ion: 1.19 mmol/L (ref 1.15–1.40)
HCT: 31 % — ABNORMAL LOW (ref 39.0–52.0)
Hemoglobin: 10.5 g/dL — ABNORMAL LOW (ref 13.0–17.0)
O2 Saturation: 97 %
Potassium: 4.6 mmol/L (ref 3.5–5.1)
Sodium: 134 mmol/L — ABNORMAL LOW (ref 135–145)
TCO2: 15 mmol/L — ABNORMAL LOW (ref 22–32)
pCO2 arterial: 24.6 mmHg — ABNORMAL LOW (ref 32.0–48.0)
pH, Arterial: 7.379 (ref 7.350–7.450)
pO2, Arterial: 95 mmHg (ref 83.0–108.0)

## 2018-09-10 LAB — BODY FLUID CELL COUNT WITH DIFFERENTIAL
Eos, Fluid: 0 %
Lymphs, Fluid: 46 %
Monocyte-Macrophage-Serous Fluid: 8 % — ABNORMAL LOW (ref 50–90)
Neutrophil Count, Fluid: 47 % — ABNORMAL HIGH (ref 0–25)
Total Nucleated Cell Count, Fluid: 3650 cu mm — ABNORMAL HIGH (ref 0–1000)

## 2018-09-10 LAB — PROTIME-INR
INR: 1.3 — ABNORMAL HIGH (ref 0.8–1.2)
Prothrombin Time: 16.2 seconds — ABNORMAL HIGH (ref 11.4–15.2)

## 2018-09-10 LAB — CBC
HCT: 35.1 % — ABNORMAL LOW (ref 39.0–52.0)
Hemoglobin: 11 g/dL — ABNORMAL LOW (ref 13.0–17.0)
MCH: 29.3 pg (ref 26.0–34.0)
MCHC: 31.3 g/dL (ref 30.0–36.0)
MCV: 93.4 fL (ref 80.0–100.0)
Platelets: 157 10*3/uL (ref 150–400)
RBC: 3.76 MIL/uL — ABNORMAL LOW (ref 4.22–5.81)
RDW: 17 % — ABNORMAL HIGH (ref 11.5–15.5)
WBC: 12.5 10*3/uL — ABNORMAL HIGH (ref 4.0–10.5)
nRBC: 0 % (ref 0.0–0.2)

## 2018-09-10 LAB — POC OCCULT BLOOD, ED: Fecal Occult Bld: NEGATIVE

## 2018-09-10 LAB — CBG MONITORING, ED: Glucose-Capillary: 239 mg/dL — ABNORMAL HIGH (ref 70–99)

## 2018-09-10 LAB — TSH: TSH: 5.358 u[IU]/mL — ABNORMAL HIGH (ref 0.350–4.500)

## 2018-09-10 LAB — SURGICAL PCR SCREEN
MRSA, PCR: NEGATIVE
Staphylococcus aureus: NEGATIVE

## 2018-09-10 LAB — PREPARE RBC (CROSSMATCH)

## 2018-09-10 LAB — ECHOCARDIOGRAM LIMITED
Height: 73 in
Height: 73 in
Weight: 2400 oz
Weight: 2400 oz

## 2018-09-10 LAB — LACTIC ACID, PLASMA
Lactic Acid, Venous: 7.7 mmol/L (ref 0.5–1.9)
Lactic Acid, Venous: 7.3 mmol/L (ref 0.5–1.9)

## 2018-09-10 LAB — MRSA PCR SCREENING: MRSA by PCR: NEGATIVE

## 2018-09-10 LAB — GLUCOSE, CAPILLARY
Glucose-Capillary: 159 mg/dL — ABNORMAL HIGH (ref 70–99)
Glucose-Capillary: 87 mg/dL (ref 70–99)
Glucose-Capillary: 89 mg/dL (ref 70–99)

## 2018-09-10 LAB — TROPONIN I: Troponin I: <0.03 ng/mL (ref <0.03)

## 2018-09-10 LAB — ABO/RH: ABO/RH(D): A POS

## 2018-09-10 LAB — BRAIN NATRIURETIC PEPTIDE: B Natriuretic Peptide: 138 pg/mL — ABNORMAL HIGH (ref 0.0–100.0)

## 2018-09-10 LAB — APTT: aPTT: 26 seconds (ref 24–36)

## 2018-09-10 SURGERY — PERICARDIOCENTESIS
Anesthesia: LOCAL

## 2018-09-10 MED ORDER — SODIUM CHLORIDE 0.9 % IV SOLN
2.0000 g | Freq: Once | INTRAVENOUS | Status: AC
  Administered 2018-09-10: 2 g via INTRAVENOUS
  Filled 2018-09-10: qty 2

## 2018-09-10 MED ORDER — INSULIN ASPART 100 UNIT/ML ~~LOC~~ SOLN
0.0000 [IU] | SUBCUTANEOUS | Status: DC
  Administered 2018-09-10 – 2018-09-12 (×5): 2 [IU] via SUBCUTANEOUS
  Administered 2018-09-13 (×3): 1 [IU] via SUBCUTANEOUS
  Administered 2018-09-13 (×2): 2 [IU] via SUBCUTANEOUS
  Administered 2018-09-13: 1 [IU] via SUBCUTANEOUS
  Administered 2018-09-14: 2 [IU] via SUBCUTANEOUS
  Administered 2018-09-14: 1 [IU] via SUBCUTANEOUS

## 2018-09-10 MED ORDER — HEPARIN (PORCINE) IN NACL 1000-0.9 UT/500ML-% IV SOLN
INTRAVENOUS | Status: AC
  Filled 2018-09-10: qty 500

## 2018-09-10 MED ORDER — LIDOCAINE HCL (PF) 1 % IJ SOLN
INTRAMUSCULAR | Status: AC
  Filled 2018-09-10: qty 30

## 2018-09-10 MED ORDER — VANCOMYCIN HCL 10 G IV SOLR
1500.0000 mg | Freq: Once | INTRAVENOUS | Status: DC
  Filled 2018-09-10 (×2): qty 1500

## 2018-09-10 MED ORDER — ORAL CARE MOUTH RINSE
15.0000 mL | Freq: Two times a day (BID) | OROMUCOSAL | Status: DC
  Administered 2018-09-10 – 2018-09-12 (×5): 15 mL via OROMUCOSAL

## 2018-09-10 MED ORDER — SODIUM CHLORIDE 0.9 % IV BOLUS
1000.0000 mL | Freq: Once | INTRAVENOUS | Status: AC
  Administered 2018-09-10: 1000 mL via INTRAVENOUS

## 2018-09-10 MED ORDER — VANCOMYCIN HCL 1.5 G IV SOLR
1500.0000 mg | Freq: Once | INTRAVENOUS | Status: AC
  Administered 2018-09-10: 1500 mg via INTRAVENOUS
  Filled 2018-09-10: qty 1500

## 2018-09-10 MED ORDER — INSULIN ASPART 100 UNIT/ML ~~LOC~~ SOLN: 0.0000 [IU] | Freq: Three times a day (TID) | SUBCUTANEOUS | Status: DC

## 2018-09-10 MED ORDER — INSULIN ASPART 100 UNIT/ML ~~LOC~~ SOLN: 0.0000 [IU] | Freq: Every day | SUBCUTANEOUS | Status: DC

## 2018-09-10 MED ORDER — HEPARIN (PORCINE) IN NACL 1000-0.9 UT/500ML-% IV SOLN
INTRAVENOUS | Status: DC | PRN
  Administered 2018-09-10: 500 mL

## 2018-09-10 MED ORDER — SODIUM CHLORIDE 0.9 % IV SOLN
INTRAVENOUS | Status: AC
  Administered 2018-09-10: 08:00:00 via INTRAVENOUS

## 2018-09-10 MED ORDER — LIDOCAINE HCL (PF) 1 % IJ SOLN
INTRAMUSCULAR | Status: DC | PRN
  Administered 2018-09-10: 18 mL via INTRADERMAL

## 2018-09-10 SURGICAL SUPPLY — 5 items
COVER DOME SNAP 22 D (MISCELLANEOUS) ×1 IMPLANT
HOVERMATT SINGLE USE (MISCELLANEOUS) ×1 IMPLANT
PACK CARDIAC CATHETERIZATION (CUSTOM PROCEDURE TRAY) ×1 IMPLANT
PERIVAC PERICARDIOCENTESIS 8.3 (TRAY / TRAY PROCEDURE) ×1 IMPLANT
SHEATH PROBE COVER 6X72 (BAG) ×1 IMPLANT

## 2018-09-10 NOTE — ED Notes (Signed)
Ac aware of vanc need

## 2018-09-10 NOTE — Consult Note (Signed)
[address]       [place] [address]             [phone]        P[facility] E [facility] [place] Medical Record #[id number] Date of Birth: [birth year]  Referring:Dr Dorris Carnes Primary Care: [name], MD Primary Cardiologist:No primary care provider on file.  Chief Complaint:    Chief Complaint  Patient presents with   Shortness of Breath  Patient examined, most recent chest x-ray image and laboratory data personally reviewed.  Patient discussed over the phone with Dr. Dayna Barker in the emergency department for coordination of care.  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  The patient has a past history of metastatic neuroendocrine cancer-Merkel cell tumor treated since 2017.  He is currently on chemotherapy including Keytruda and demsumab at [facility].  Last month he had a pathologic fracture of his right femur from metastatic disease.  The patient has had increasing shortness of breath weakness fatigue and poor appetite over the past 48 hours.  He presented to the ED at [facility] in shock with tachycardia, metabolic  acidosis, elevated serum lactate, acute renal failure with creatinine of 4.1 and enlarged cardiac silhouette on chest x-ray.  Bedside echo by the ED physician demonstrated pericardial effusion and a anterior pericardiocentesis was performed.  Patient improved clinically.  He was transferred to this hospital for further evaluation and treatment.  An echocardiogram was performed in mid March which showed good biventricular function and a small pericardial effusion.  Sinus rhythm.  Well-functioning transcatheter AVR placed at [facility] 8 months ago.  Echocardiogram here is pending.   Current  Activity/ Functional Status: Patient has only sedentary functional activity and depends on family for assistance with activities of daily living   Zubrod Score: At the time of surgery this patients most appropriate activity status/level should be described as: []     0    Normal activity, no symptoms []     1    Restricted in physical strenuous activity but ambulatory, able to do out light work []     2    Ambulatory and capable of self care, unable to do work activities, up and about                 more than 50%  Of the time                            []     3    Only limited self care, in bed greater than 50% of waking hours []     4    Completely disabled, no self care, confined to bed or chair [x]     5    Moribund  Past Medical History:  Diagnosis Date   Adrenal nodule ([place]) 11/10/2011   Arthritis    Cancer ([place])    skin cancer   Chronic low back pain    Complication of anesthesia    Diabetes mellitus    GERD (gastroesophageal reflux disease)    History of hip surgery 06/02/2018   right hip, right knee   Hyperlipemia    Hypertension    Merkel cell carcinoma of face (  [place]) 02/18/2016    Followed by [place], underwent surgery now undergoing radiation September 2017   PONV (postoperative nausea and vomiting)    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   26 mm Edwards Sapien 3 transcatheter heart valve placed via percutaneous right transfemoral approach     Past Surgical History:  Procedure Laterality Date   BIOPSY  01/16/2012   Procedure: BIOPSY;  Surgeon: Rogene Houston, MD;  Location: [facility];  Service: Endoscopy;  Laterality: N/A;   CATARACT EXTRACTION Right 7/06   CATARACT EXTRACTION W/PHACO Left 01/05/2014   Procedure: CATARACT EXTRACTION PHACO AND INTRAOCULAR LENS PLACEMENT (IOC);  Surgeon: Tonny Branch, MD;  Location: [facility] ORS;  Service: Ophthalmology;  Laterality: Left;  CDE:15.98   COLONOSCOPY  01/29/12   abnormal repeat in one year     ESOPHAGOGASTRODUODENOSCOPY     KNEE ARTHROSCOPY     2 right knee surgeries and 1 left knee surgery   RIGHT/LEFT HEART CATH AND CORONARY ANGIOGRAPHY N/A 11/25/2017   Procedure: RIGHT/LEFT HEART CATH AND CORONARY ANGIOGRAPHY;  Surgeon: Burnell Blanks, MD;  Location: [place] CV LAB;  Service: Cardiovascular;  Laterality: N/A;   TEE WITHOUT CARDIOVERSION N/A 12/22/2017   Procedure: TRANSESOPHAGEAL ECHOCARDIOGRAM (TEE);  Surgeon: Burnell Blanks, MD;  Location: [place];  Service: Open Heart Surgery;  Laterality: N/A;   TONSILLECTOMY     TRANSCATHETER AORTIC VALVE REPLACEMENT, TRANSFEMORAL N/A 12/22/2017   Procedure: TRANSCATHETER AORTIC VALVE REPLACEMENT, TRANSFEMORAL;  Surgeon: Burnell Blanks, MD;  Location: [place];  Service: Open Heart Surgery;  Laterality: N/A;   VASECTOMY      Social History   Tobacco Use  Smoking Status Never Smoker  Smokeless Tobacco Never Used  Tobacco Comment   From spouse. Wife does not smoke around him now    Social History   Substance and Sexual Activity  Alcohol Use No   Alcohol/week: 0.0 standard drinks     Allergies  Allergen Reactions   Cortisone Other (See Comments)    swelling   Tetanus Toxoids Other (See Comments)    Swelling, not specified   Hydrocodone-Homatropine Nausea And Vomiting    Current Facility-Administered Medications  Medication Dose Route Frequency Provider Last Rate Last Dose   0.9 %  sodium chloride infusion   Intravenous STAT Francine Graven, DO 75 mL/hr at 09/10/18 5885      Medications Prior to Admission  Medication Sig Dispense Refill Last Dose   acetaminophen (TYLENOL) 500 MG tablet Take 500 mg by mouth every 4 (four) hours as needed for mild pain or moderate pain.    08/14/2018 at Unknown time   aspirin EC 81 MG tablet Take 81 mg by mouth daily.   08/14/2018 at Unknown time   benzonatate (TESSALON) 200 MG capsule TAKE 1 CAPSULE BY MOUTH 3 TIMES A DAY AS NEEDED FOR COUGH 30  capsule 0    calcium carbonate (CALCIUM 600) 600 MG TABS tablet Take 1 tablet by mouth daily.   08/14/2018 at Unknown time   cefdinir (OMNICEF) 300 MG capsule Take 1 capsule (300 mg total) by mouth every 12 (twelve) hours. 8 capsule 0    Cholecalciferol (DIALYVITE VITAMIN D 5000) 125 MCG (5000 UT) capsule Take 5,000 Units by mouth daily.   08/14/2018 at Unknown time   denosumab (PROLIA) 60 MG/ML SOSY injection Inject 120 mg into the skin every 6 (six) months.    unknown   diclofenac sodium (VOLTAREN) 1 % GEL Apply 1 application topically 4 (four) times  daily.   08/14/2018 at Unknown time   diphenhydrAMINE-APAP, sleep, (TYLENOL PM EXTRA STRENGTH PO) Take 1 tablet by mouth at bedtime.   08/14/2018 at Unknown time   feeding supplement, ENSURE ENLIVE, (ENSURE ENLIVE) LIQD Take 237 mLs by mouth 2 (two) times daily between meals. (Patient taking differently: Take 237 mLs by mouth 3 (three) times daily with meals. ) 237 mL 12 08/14/2018 at Unknown time   ibuprofen (ADVIL,MOTRIN) 600 MG tablet Take 600 mg by mouth 2 (two) times daily.   08/14/2018 at Unknown time   insulin glargine (LANTUS) 100 UNIT/ML injection Inject 0.08 mLs (8 Units total) into the skin at bedtime. 10 mL 11 08/13/2018 at Unknown time   INV-pembrolizumab LCCC 1525 (KEYTRUDA) 100 MG/4ML SOLN Inject 200 mg into the vein every 21 ( twenty-one) days.   Past Month at Unknown time   lovastatin (MEVACOR) 20 MG tablet TAKE (1) TABLET BY MOUTH AT BEDTIME. 90 tablet 0    metoprolol succinate (TOPROL-XL) 25 MG 24 hr tablet Take 12.5 mg by mouth daily.   08/14/2018 at Unknown time   Multiple Vitamins-Minerals (SENTRY ADULT PO) Take 1 tablet by mouth daily.   08/14/2018 at Unknown time   nystatin (MYCOSTATIN) 100000 UNIT/ML suspension One tsp swish and swallow four times per day for seven. (Patient taking differently: Take 5 mLs by mouth 4 (four) times daily. One tsp swish and swallow four times per day for seven.) 140 mL 0 08/13/2018 at Unknown  time   ondansetron (ZOFRAN) 8 MG tablet Take 8 mg by mouth every 8 (eight) hours as needed for nausea or vomiting. Takes scheduled. If missed doses will be significant   08/14/2018 at Unknown time   Oxycodone HCl 10 MG TABS 1 every 4 hours as needed severe pain caution drowsiness metastatic cancer (Patient taking differently: Take 10 mg by mouth at bedtime as needed. 1 every 4 hours as needed severe pain caution drowsiness metastatic cancer) 50 tablet 0 08/14/2018 at Unknown time   pantoprazole (PROTONIX) 40 MG tablet Take 40 mg by mouth daily.   08/14/2018 at Unknown time   polyethylene glycol (MIRALAX / GLYCOLAX) packet Take 17 g by mouth 2 (two) times daily.   08/13/2018 at Unknown time   prochlorperazine (COMPAZINE) 10 MG tablet Take 10 mg by mouth every 6 (six) hours as needed for nausea or vomiting.   08/14/2018 at Unknown time    Family History  Problem Relation Age of Onset   Diabetes Sister    Heart disease Sister    Atrial fibrillation Sister    Heart disease Other    Diabetes Other    Depression Maternal Grandfather      Review of Systems:   ROS      Cardiac Review of Systems: Y or  [    ]= no  Chest Pain [ y   ]  Resting SOB [ y  ] Exertional SOB  Blue.Reese  ]  Orthopnea [ y ]   Pedal Edema [   ]    Palpitations [  ] Syncope  [  ]   Presyncope Blue.Reese   ]  General Review of Systems: [Y] = yes [  ]=no Constitional: recent weight change [ y ]; anorexia [  ]; fatigue Blue.Reese  ]; nausea [  ]; night sweats [  ]; fever [  ]; or chills [  ]  Dental: Last Dentist visit: 1 year  Eye : blurred vision [  ]; diplopia [   ]; vision changes [  ];  Amaurosis fugax[  ]; Resp: cough [  ];  wheezing[  ];  hemoptysis[  ]; shortness of breath[y  ]; paroxysmal nocturnal dyspnea[  ]; dyspnea on exertion[  ]; or orthopnea[  ];  GI:  gallstones[  ], vomiting[  ];  dysphagia[  ]; melena[  ];  hematochezia [  ]; heartburn[  ];   Hx of  Colonoscopy[   ]; GU: kidney stones [  ]; hematuria[  ];   dysuria [  ];  nocturia[  ];  history of     obstruction [  ]; urinary frequency [  ]             Skin: rash, swelling[  ];, hair loss[  ];  peripheral edema[  ];  or itching[  ]; Musculosketetal: myalgias[  ];  joint swelling[  ];  joint erythema[  ];  joint pain[y  ];  back pain[  ];  Heme/Lymph: bruising[  ];  bleeding[  ];  anemia[  ];  Neuro: TIA[  ];  headaches[  ];  stroke[  ];  vertigo[  ];  seizures[  ];   paresthesias[  ];  difficulty walking[  ];  Psych:depression[  ]; anxiety[  ];  Endocrine: diabetes[y  ];  thyroid dysfunction[  ];               Physical Exam: BP 116/67    Pulse (!) 118    Temp 97.6 F (36.4 C) (Rectal)    Resp 20    Ht 6\' 1"  (1.854 m)    Wt 68 kg    SpO2 95%    BMI 19.79 kg/m   Elderly Caucasian male tachypneic tachycardic with some wheezing supine in the ICU bed Positive JVD, no carotid bruit, no adenopathy of the neck Breath sounds slightly diminished on the left side with wheezing  COR atrial fibrillation heart rate 120, no friction rub or murmur Abdomen soft nondistended Extremities cool without significant edema, peripheral pulses not palpable Neurologic alert and responsive, no focal motor deficit   Diagnostic Studies & Laboratory data:     Recent Radiology Findings:   Dg Chest Portable 1 View  Result Date: 09/10/2018 CLINICAL DATA:  Shortness of breath today. EXAM: PORTABLE CHEST 1 VIEW COMPARISON:  08/14/2018 FINDINGS: Cardiomegaly as seen previously. Aortic atherosclerosis. The right lung is clear. Question infiltrate or volume loss in the left lower lobe, probably with a small left effusion. Consider two-view chest radiography when able. IMPRESSION: Apparent development of a left effusion with left lower lobe infiltrate and/or atelectasis. Chronic cardiomegaly. Electronically Signed   By: Nelson Chimes M.D.   On: 09/10/2018 05:53     I have independently reviewed the above radiologic studies and  discussed with the patient   Recent Lab Findings: Lab Results  Component Value Date   WBC 15.0 (H) 09/10/2018   HGB 9.9 (L) 09/10/2018   HCT 32.5 (L) 09/10/2018   PLT 235 09/10/2018   GLUCOSE 314 (H) 09/10/2018   CHOL 137 08/31/2017   TRIG 68 08/31/2017   HDL 43 08/31/2017   LDLCALC 80 08/31/2017   ALT 10 08/14/2018   AST 38 08/14/2018   NA 134 (L) 09/10/2018   K 5.1 09/10/2018   CL 102 09/10/2018   CREATININE 4.64 (H) 09/10/2018   BUN 45 (H) 09/10/2018   CO2 11 (L)  09/10/2018   TSH 2.530 12/30/2017   INR 1.3 (H) 09/10/2018   HGBA1C 6.6 (A) 03/08/2018      Assessment / Plan:    Cardiogenic shock from pericardial tamponade. Metastatic neuroendocrine cancer with bone metastasis Acute renal failure from cardiac tamponade Diabetes mellitus Immunosuppressed from active multi agent chemotherapy Status post transcatheter AVR mid 2019 for aortic stenosis  Echocardiogram by report shows a large pericardial effusion. Recommend urgent procedure with least risk to this elderly patient with poor prognosis- pigtail catheter to drain pericardial effusion by interventional cardiology.  No evidence of trauma or aortic dissection as cause of pericardial effusion.   Will need input from patient, family, and oncologist regarding whether surgical pericardial window would be indicated later.  Will follow.  @ME1 @ 09/10/2018 9:48 AM

## 2018-09-10 NOTE — Progress Notes (Signed)
Inpatient Diabetes Program Recommendations  AACE/ADA: New Consensus Statement on Inpatient Glycemic Control (2015)  Target Ranges:  Prepandial:   less than 140 mg/dL      Peak postprandial:   less than 180 mg/dL (1-2 hours)      Critically ill patients:  140 - 180 mg/dL   Lab Results  Component Value Date   GLUCAP 239 (H) 09/10/2018   HGBA1C 6.6 (A) 03/08/2018    Review of Glycemic Control Results for CLAIBORNE, STROBLE (MRN [id number]) as of 09/10/2018 14:05  Ref. Range 09/10/2018 06:32  Glucose-Capillary Latest Ref Range: 70 - 99 mg/dL 239 (H)   Diabetes history: DM 2 Outpatient Diabetes medications: Lantus 8 units daily Current orders for Inpatient glycemic control:  None  Inpatient Diabetes Program Recommendations:   Please add Novolog correction sensitive q 4 hours.   Thanks  Adah Perl, RN, BC-ADM Inpatient Diabetes Coordinator Pager [phone] (8a-5p)

## 2018-09-10 NOTE — ED Notes (Signed)
CRITICAL VALUE ALERT  Critical Value:  Lactic Acid 7.3  Date & Time Notied:  09/10/18 0803  Provider Notified: Dr. Harrington Challenger  Orders Received/Actions taken: MD notified

## 2018-09-10 NOTE — ED Provider Notes (Signed)
Emergency Department Provider Note   I have reviewed the triage vital signs and the nursing notes.   HISTORY  Chief Complaint Shortness of Breath   HPI Jesse Cordova is a 78 y.o. male with multiple medical problems documented below who presents the emergency department today for dyspnea.  His story is obtained from medical records, his granddaughter who is his POA and the patient himself.  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  From what I can gather has had some progressively worsening shortness of breath got acutely worsened tonight so called his granddaughter who called EMS.  When they got there her sats were 78% and he was hypotensive and tachycardic in respiratory distress so they brought him here for further evaluation.  He states that he is now having little bit of chest pain.  Never had any like this before.  No recent fevers or cough.  No rashes.   No other associated or modifying symptoms.    Past Medical History:  Diagnosis Date  . 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 Arthritis   . Cancer ([place])    skin cancer  . Chronic low back pain   . Complication of anesthesia   . Diabetes mellitus   . GERD (gastroesophageal reflux disease)   . History of hip surgery 06/02/2018   right hip, right knee  . Hyperlipemia   . Hypertension   . 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 PONV (postoperative nausea and vomiting)   . 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 Acute lower UTI 08/16/2018  . Sepsis due to undetermined organism ([place]) 08/16/2018  . Febrile illness   . Fever 08/14/2018  . Sepsis ([place]) 07/20/2018   . CKD stage 2 due to type 2 diabetes mellitus ([place]) 07/06/2018  . Fracture of femoral neck, right, closed ([place]) 06/05/2018  . GERD (gastroesophageal reflux disease) 06/05/2018  . CAD (coronary artery disease) 06/05/2018  . Chest pain 06/05/2018  . 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 Severe aortic stenosis 12/22/2017  . 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 Essential hypertension, benign 11/15/2012  . Hyperlipidemia 11/15/2012  . Controlled diabetes mellitus type 2 with complications ([place]) 66/59/9357  . 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 BIOPSY  01/16/2012   Procedure: BIOPSY;  Surgeon: Rogene Houston, MD;  [facility];  Service: Endoscopy;  Laterality: N/A;  . CATARACT EXTRACTION Right 7/06  . CATARACT EXTRACTION W/PHACO Left 01/05/2014   Procedure: CATARACT EXTRACTION PHACO AND INTRAOCULAR LENS PLACEMENT (IOC);  Surgeon: Tonny Branch, MD;  Location: [facility];  Service: Ophthalmology;  Laterality: Left;  CDE:15.98  . COLONOSCOPY  01/29/12   abnormal repeat in one year  . ESOPHAGOGASTRODUODENOSCOPY    . KNEE ARTHROSCOPY     2 right knee surgeries and 1 left knee surgery  . RIGHT/LEFT HEART CATH AND CORONARY ANGIOGRAPHY N/A 11/25/2017   Procedure: RIGHT/LEFT HEART CATH AND CORONARY ANGIOGRAPHY;  Surgeon: Burnell Blanks, MD;  Location: [facility];  Service: Cardiovascular;  Laterality: N/A;  . TEE WITHOUT  CARDIOVERSION N/A 12/22/2017   Procedure: TRANSESOPHAGEAL ECHOCARDIOGRAM (TEE);  Surgeon: Burnell Blanks, MD;  Location: [facility];  Service: Open Heart Surgery;  Laterality: N/A;  . TONSILLECTOMY    . TRANSCATHETER AORTIC VALVE REPLACEMENT, TRANSFEMORAL N/A 12/22/2017   Procedure: TRANSCATHETER AORTIC VALVE REPLACEMENT, TRANSFEMORAL;  Surgeon: Burnell Blanks, MD;  Location: [facility];  Service: Open Heart Surgery;  Laterality: N/A;  . VASECTOMY      Current  Outpatient Rx  . Order #: [id number] Class: Historical Med  . Order #: [id number] Class: Historical Med  . Order #: [id number] Class: Normal  . Order #: [id number] Class: Historical Med  . Order #: [id number] Class: Normal  . Order #: [id number] Class: Historical Med  . Order #: [id number] Class: Historical Med  . Order #: [id number] Class: Historical Med  . Order #: [id number] Class: Historical Med  . Order #: [id number] Class: Normal  . Order #: [id number] Class: Historical Med  . Order #: [id number] Class: Normal  . Order #: [id number] Class: Historical Med  . Order #: [id number] Class: Normal  . Order #: [id number] Class: Historical Med  . Order #: [id number] Class: Historical Med  . Order #: [id number] Class: Normal  . Order #: [id number] Class: Historical Med  . Order #: [id number] Class: Normal  . Order #: [id number] Class: Historical Med  . Order #: [id number] Class: Historical Med  . Order #: [id number] Class: Historical Med    Allergies Cortisone; Tetanus toxoids; and Hydrocodone-homatropine  Family History  Problem Relation Age of Onset  . Diabetes Sister   . Heart disease Sister   . Atrial fibrillation Sister   . Heart disease Other   . Diabetes Other   . Depression Maternal Grandfather     Social History Social History   Tobacco Use  . Smoking status: Never Smoker  . Smokeless tobacco: Never Used  . Tobacco comment: From spouse. Wife does not smoke around him now  Substance Use Topics  . Alcohol use: No    Alcohol/week: 0.0 standard drinks  . Drug use: No    Review of Systems  All other systems negative except as documented in the HPI. All pertinent positives and negatives as reviewed in the HPI. ____________________________________________   PHYSICAL EXAM:  VITAL SIGNS: ED Triage Vitals  Enc Vitals Group     BP 09/10/18 0600 101/84     Pulse Rate 09/10/18 0611 (!) 32     Resp 09/10/18 0600 (!) 39     Temp 09/10/18 0549 97.6 F (36.4 C)     Temp Source 09/10/18 0549 Rectal      SpO2 09/10/18 0611 91 %     Weight 09/10/18 0541 150 lb (68 kg)     Height 09/10/18 0541 6\' 1"  (1.854 m)    Constitutional: Alert and oriented. Cachectic, pale, ill appearing in respiratory distress.  Eyes: Conjunctivae are normal. PERRL. EOMI. Eyes are sunken Head: Atraumatic. Nose: No congestion/rhinnorhea. Mouth/Throat: Mucous membranes are dry.  Oropharynx non-erythematous. Neck: No stridor.  No meningeal signs.   Cardiovascular: tachycardic rate, irregular rhythm. Good peripheral circulation. Grossly normal heart sounds.   Respiratory: Normal respiratory effort.  No retractions. Lungs CTAB. Gastrointestinal: Soft and nontender. No distention.  Musculoskeletal: No lower extremity tenderness nor edema. No gross deformities of extremities. Neurologic:  Normal speech and language. No gross focal neurologic deficits are appreciated.  Skin:  Skin is cool, pale, diaphoretic on his forehead.   ____________________________________________   LABS (all labs ordered are listed, but only abnormal results are displayed)  Labs Reviewed  BLOOD GAS, ARTERIAL - Abnormal; Notable for the following components:  Result Value   pH, Arterial 7.345 (*)    pCO2 arterial <19.0 (*)    pO2, Arterial 305 (*)    Bicarbonate 11.7 (*)    Acid-base deficit 17.8 (*)    All other components within normal limits  LACTIC ACID, PLASMA - Abnormal; Notable for the following components:   Lactic Acid, Venous 7.7 (*)    All other components within normal limits  CBC WITH DIFFERENTIAL/PLATELET - Abnormal; Notable for the following components:   WBC 15.0 (*)    RBC 3.26 (*)    Hemoglobin 9.9 (*)    HCT 32.5 (*)    RDW 17.5 (*)    Neutro Abs 10.7 (*)    Monocytes Absolute 1.4 (*)    Abs Immature Granulocytes 0.16 (*)    All other components within normal limits  BASIC METABOLIC PANEL - Abnormal; Notable for the following components:   Sodium 134 (*)    CO2 11 (*)    Glucose, Bld 314 (*)    BUN 45 (*)     Creatinine, Ser 4.64 (*)    GFR calc non Af Amer 11 (*)    GFR calc Af Amer 13 (*)    Anion gap >20 (*)    All other components within normal limits  BRAIN NATRIURETIC PEPTIDE - Abnormal; Notable for the following components:   B Natriuretic Peptide 138.0 (*)    All other components within normal limits  PROTIME-INR - Abnormal; Notable for the following components:   Prothrombin Time 16.2 (*)    INR 1.3 (*)    All other components within normal limits  CBG MONITORING, ED - Abnormal; Notable for the following components:   Glucose-Capillary 239 (*)    All other components within normal limits  CULTURE, BLOOD (ROUTINE X 2)  CULTURE, BLOOD (ROUTINE X 2)  BODY FLUID CULTURE  GRAM STAIN  TROPONIN I  LACTIC ACID, PLASMA  SYNOVIAL CELL COUNT + DIFF, W/ CRYSTALS  GLUCOSE, BODY FLUID OTHER  POC OCCULT BLOOD, ED  TYPE AND SCREEN  PREPARE RBC (CROSSMATCH)  ABO/RH   ____________________________________________  EKG      ____________________________________________  RADIOLOGY  Dg Chest Portable 1 View  Result Date: 09/10/2018 CLINICAL DATA:  Shortness of breath today. EXAM: PORTABLE CHEST 1 VIEW COMPARISON:  08/14/2018 FINDINGS: Cardiomegaly as seen previously. Aortic atherosclerosis. The right lung is clear. Question infiltrate or volume loss in the left lower lobe, probably with a small left effusion. Consider two-view chest radiography when able. IMPRESSION: Apparent development of a left effusion with left lower lobe infiltrate and/or atelectasis. Chronic cardiomegaly. Electronically Signed   By: Nelson Chimes M.D.   On: 09/10/2018 05:53    ____________________________________________   PROCEDURES  Procedure(s) performed:   .Critical Care Performed by: Merrily Pew, MD Authorized by: Merrily Pew, MD   Critical care provider statement:    Critical care time (minutes):  45   Critical care was necessary to treat or prevent imminent or life-threatening  deterioration of the following conditions:  Cardiac failure, shock, respiratory failure and circulatory failure   Critical care was time spent personally by me on the following activities:  Discussions with consultants, evaluation of patient's response to treatment, examination of patient, ordering and performing treatments and interventions, ordering and review of laboratory studies, ordering and review of radiographic studies, pulse oximetry, re-evaluation of patient's condition, obtaining history from patient or surrogate and review of old charts    Pericardiocentesis Procedure Note  INDICATION: pericardial tamponade PROCEDURE OPERATOR: Corene Cornea  Jannis Atkins, M.D. ATTENDING PHYSICIAN: Merrily Pew, M.D. Ultrasound used to mark location: Yes   CONSENT:  Consent was obtained from patient emergentlty   PROCEDURE SUMMARY:  A time-out was performed. My hands were washed immediately prior to the procedure. I wore a surgical cap, mask with protective eyewear, sterile gown and sterile gloves throughout the procedure. The area was cleansed and draped in usual sterile fashion using chlorhexidine scrub. The subxiphoid area of the abdomen was prepped and draped in a sterile fashion using chlorhexidine scrub. A 20 gauge needle was inserted and not able to aspirate fluid. Ultrasound was used to locate anterior approach. Area was cleaned with chlorhexidine and an 18 gauge needle was inserted and subsequently 120cc of dark red thin fluid was taken off.  A bandaid was placed over the puncture wound. The patient tolerated the procedure well without any immediate complications. Estimated blood loss was <5cc.    EMERGENCY DEPARTMENT [place] CARDIAC EXAM "Study: Limited Ultrasound of the heart and pericardium"  INDICATIONS:Abnormal vital signs, Chest pain and Dyspnea Multiple views of the heart and pericardium were obtained in real-time with a multi-frequency probe.  PERFORMED BS:[id number]  IMAGES ARCHIVED?: Yes  FINDINGS:  Large effusion, Hyperdynamic contractility and Tamponade physiology present  LIMITATIONS:  Body habitus and Emergent procedure  VIEWS USED: Subcostal 4 chamber and Apical 4 chamber   INTERPRETATION: Pericardial effusion present and Cardiac tamponade present  CPT Code: 16384-66 (limited transthoracic cardiac)  ____________________________________________   INITIAL IMPRESSION / ASSESSMENT AND PLAN / ED COURSE  On initial assessment patient was in obvious respiratory distress and pale with cool extremities and had stated he had some dark stools recently so 2 units of uncrossed matched blood were given which did not seem to help his tachycardia or his hypotension.  A blood gas drawn at this time on a nonrebreather showed ox saturation to 90% with a PO2 of 300.  Suspecting possible pneumothorax x-ray was done which showed a small effusion but not enough to be responsible for his profound dyspnea.  Ultrasound utilized to evaluate for possible signs of PE and revealed a large pericardial effusion with likely pericardial tamponade.  A subxiphoid approach was utilized unsuccessfully and switch to an anterior approach and 100 2230 cc of thin blood colored serous fluid was withdrawn.  This was accidentally thrown in the sharp container by nursing and there is no way to retrieve it.  Patient's blood pressure improved to the 120s his heart rate dropped down to around 100 and his respiratory rate improved from 50-20 after the procedure.  Patient states almost complete resolution of his symptoms.  Suspect this is likely from his recent chemotherapy. Discussed with cardiothoracic surgery, Dr. Lucianne Lei trigt who will see the patient when he gets [facility] but recommends cardiology ICU admission. Discussed with Dr. Harrington Challenger to include all pertinent findings, labs, vital signs and she accepts and will admit to cardiac ICU.  Patient is stable at this time.  His granddaughter is updated on all of these proceedings.  His lactic acid is elevated but is liekly related to obstructive shock rathr than sepsis but abx ordered anyway secondary to possibly pneumonia and this not perfectly sterile procedure.    Pertinent labs & imaging results that were available during my care of the patient were reviewed by me and considered in my medical decision making (see chart for details).  ____________________________________________  FINAL CLINICAL IMPRESSION(S) / ED DIAGNOSES  Final diagnoses:  Pericardial effusion with cardiac tamponade  Lactic acidosis  MEDICATIONS GIVEN DURING THIS VISIT:  Medications  vancomycin (VANCOCIN) 1,500 mg in sodium chloride 0.9 % 500 mL IVPB (has no administration in time range)  sodium chloride 0.9 % bolus 1,000 mL (0 mLs Intravenous Stopped 09/10/18 0650)  ceFEPIme (MAXIPIME) 2 g in sodium chloride 0.9 % 100 mL IVPB (2 g Intravenous New Bag/Given 09/10/18 0640)     NEW OUTPATIENT MEDICATIONS STARTED DURING THIS VISIT:  New Prescriptions   No medications on file    Note:  This note was prepared with assistance of Dragon voice recognition software. Occasional wrong-word or sound-a-like substitutions may have occurred due to the inherent limitations of voice recognition software.   Silas Muff, Corene Cornea, MD 09/10/18 [phone]

## 2018-09-10 NOTE — H&P (Signed)
Cardiology Admission History and Physical:   Patient ID: Jesse Cordova MRN: [id number]; DOB: 11/20/1940   Admission date: 09/10/2018  Primary Care Provider: [name], MD Primary Cardiologist: Dr. Marylene Land (TAVR) Primary Electrophysiologist:  None   Chief Complaint:  Pericardial effusion with tamponade  Patient Profile:   Jesse Cordova is a 78 y.o. 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 He also has hx of metastatic  Merkel cell cancer currently getting chemotherapy at [facility].Marland Kitchen Pt presented to [facility] with respiratory distress,. Found to have large pericardial effusion/tamponade. Pericardiocentesis was done in ED with 120cc fluid. Immediate improvement in condition. Pt transferred to [facility].  For further care  History of Present Illness:   Jesse Cordova  presented to [facility] ED this am with complaints of dyspnea. His dyspnea had been progressive with acute worsening last night. His granddaughter called EMS. Pt notes chest pressure   Also notes dizziness but no syncope On arrival to ED  his O2 sat was 78% and he was hypotensive and tachycardic in respiratory distress. He also reported "a little bit of chest pain" in the ED. By report extemities cool   Hgb 9.9    He was given 2 units of blood with not much improvement in tachycardia and hypotension.  A blood gas drawn at this time on a nonrebreather showed ox saturation to 90% with a PO2 of 300.  Suspecting for possible pneumothorax an  x-ray was done which showed a small effusion and cardiomegaly   but not enough to be responsible for his profound dyspnea.    A limited echo was done in the ED that showed a large pericardial effusion with hyperdynamic contractility and tamponade physiology. An emergent pericardiocentesis was done by the ED physician with removal of 120cc of dark red thin fluid. Unfortunately the fluid was accidentally discarded. The patient's BP and heart rate improved.  Respiratory rate decreased from 50 to 20. Pt reported almost complete resolution of his symptoms.   The ED physician discussed the case with cardiothoracic surgery, Dr. Darcey Nora who recommended transfer to [facility] Cardiac ICU  Here in ICU pt notes some chest tightness  Still some SOB    Patient says he hasnt eatten in 3 days   Seen in [place] clinic on 09/06/18  Echo:  Prelim:   Large pericardial effusion surrounds heart  Thre is some RV collapse   IVC is dilated with no respiratory variation     Past Medical History:  Diagnosis Date   Adrenal nodule ([place]) 11/10/2011   Arthritis    Cancer ([place])    skin cancer   Chronic low back pain    Complication of anesthesia    Diabetes mellitus    GERD (gastroesophageal reflux disease)    History of hip surgery 06/02/2018   right hip, right knee   Hyperlipemia    Hypertension    Merkel cell carcinoma of face ([place]) 02/18/2016    Followed by [place], underwent surgery now undergoing radiation September 2017   PONV (postoperative nausea and vomiting)    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   26 mm Edwards Sapien 3 transcatheter heart valve placed via percutaneous right transfemoral approach     Past Surgical History:  Procedure Laterality Date   BIOPSY  01/16/2012   Procedure: BIOPSY;  Surgeon: Rogene Houston, MD;  Location[facility];  Service: Endoscopy;  Laterality: N/A;   CATARACT EXTRACTION Right 7/06  CATARACT EXTRACTION W/PHACO Left 01/05/2014   Procedure: CATARACT EXTRACTION PHACO AND INTRAOCULAR LENS PLACEMENT (IOC);  Surgeon: Tonny Branch, MD;  Location: [facility] ORS;  Service: Ophthalmology;  Laterality: Left;  CDE:15.98   COLONOSCOPY  01/29/12   abnormal repeat in one year   ESOPHAGOGASTRODUODENOSCOPY     KNEE ARTHROSCOPY     2 right knee surgeries and 1 left knee surgery   RIGHT/LEFT HEART CATH AND CORONARY ANGIOGRAPHY N/A 11/25/2017   Procedure: RIGHT/LEFT HEART CATH AND CORONARY  ANGIOGRAPHY;  Surgeon: Burnell Blanks, MD;  Location: [place] CV LAB;  Service: Cardiovascular;  Laterality: N/A;   TEE WITHOUT CARDIOVERSION N/A 12/22/2017   Procedure: TRANSESOPHAGEAL ECHOCARDIOGRAM (TEE);  Surgeon: Burnell Blanks, MD;  Location: [place];  Service: Open Heart Surgery;  Laterality: N/A;   TONSILLECTOMY     TRANSCATHETER AORTIC VALVE REPLACEMENT, TRANSFEMORAL N/A 12/22/2017   Procedure: TRANSCATHETER AORTIC VALVE REPLACEMENT, TRANSFEMORAL;  Surgeon: Burnell Blanks, MD;  Location: [place];  Service: Open Heart Surgery;  Laterality: N/A;   VASECTOMY       Medications Prior to Admission: Prior to Admission medications   Medication Sig Start Date End Date Taking? Authorizing Provider  acetaminophen (TYLENOL) 500 MG tablet Take 500 mg by mouth every 4 (four) hours as needed for mild pain or moderate pain.     [provider]  aspirin EC 81 MG tablet Take 81 mg by mouth daily.    [provider]  benzonatate (TESSALON) 200 MG capsule TAKE 1 CAPSULE BY MOUTH 3 TIMES A DAY AS NEEDED FOR COUGH 08/18/18   [name], MD  calcium carbonate (CALCIUM 600) 600 MG TABS tablet Take 1 tablet by mouth daily.    [provider]  cefdinir (OMNICEF) 300 MG capsule Take 1 capsule (300 mg total) by mouth every 12 (twelve) hours. 08/17/18   Orson Eva, MD  Cholecalciferol (DIALYVITE VITAMIN D 5000) 125 MCG (5000 UT) capsule Take 5,000 Units by mouth daily.    [provider]  denosumab (PROLIA) 60 MG/ML SOSY injection Inject 120 mg into the skin every 6 (six) months.     [provider]  diclofenac sodium (VOLTAREN) 1 % GEL Apply 1 application topically 4 (four) times daily.    [provider]  diphenhydrAMINE-APAP, sleep, (TYLENOL PM EXTRA STRENGTH PO) Take 1 tablet by mouth at bedtime.    [provider]  feeding supplement, ENSURE ENLIVE, (ENSURE ENLIVE) LIQD Take 237 mLs by mouth 2 (two) times daily  between meals. Patient taking differently: Take 237 mLs by mouth 3 (three) times daily with meals.  07/24/18   Roxan Hockey, MD  ibuprofen (ADVIL,MOTRIN) 600 MG tablet Take 600 mg by mouth 2 (two) times daily.    [provider]  insulin glargine (LANTUS) 100 UNIT/ML injection Inject 0.08 mLs (8 Units total) into the skin at bedtime. 07/23/18   Roxan Hockey, MD  INV-pembrolizumab LCCC 1525 (KEYTRUDA) 100 MG/4ML SOLN Inject 200 mg into the vein every 21 ( twenty-one) days.    [provider]  lovastatin (MEVACOR) 20 MG tablet TAKE (1) TABLET BY MOUTH AT BEDTIME. 08/19/18   [name], MD  metoprolol succinate (TOPROL-XL) 25 MG 24 hr tablet Take 12.5 mg by mouth daily.    [provider]  Multiple Vitamins-Minerals (SENTRY ADULT PO) Take 1 tablet by mouth daily.    [provider]  nystatin (MYCOSTATIN) 100000 UNIT/ML suspension One tsp swish and swallow four times per day for seven. Patient  taking differently: Take 5 mLs by mouth 4 (four) times daily. One tsp swish and swallow four times per day for seven. 08/09/18   [name], MD  ondansetron (ZOFRAN) 8 MG tablet Take 8 mg by mouth every 8 (eight) hours as needed for nausea or vomiting. Takes scheduled. If missed doses will be significant    [provider]  Oxycodone HCl 10 MG TABS 1 every 4 hours as needed severe pain caution drowsiness metastatic cancer Patient taking differently: Take 10 mg by mouth at bedtime as needed. 1 every 4 hours as needed severe pain caution drowsiness metastatic cancer 07/06/18   [name], MD  pantoprazole (PROTONIX) 40 MG tablet Take 40 mg by mouth daily.    [provider]  polyethylene glycol (MIRALAX / GLYCOLAX) packet Take 17 g by mouth 2 (two) times daily.    [provider]  prochlorperazine (COMPAZINE) 10 MG tablet Take 10 mg by mouth every 6 (six) hours as needed for nausea or vomiting.    [provider]     Allergies:     Allergies  Allergen Reactions   Cortisone Other (See Comments)    swelling   Tetanus Toxoids Other (See Comments)    Swelling, not specified   Hydrocodone-Homatropine Nausea And Vomiting    Social History:   Social History   Socioeconomic History   Marital status: Widowed    Spouse name: Not on file   Number of children: Not on file   Years of education: Not on file   Highest education level: Not on file  Occupational History   Not on file  Social Needs   Financial resource strain: Patient refused   Food insecurity:    Worry: Patient refused    Inability: Patient refused   Transportation needs:    Medical: Patient refused    Non-medical: Patient refused  Tobacco Use   Smoking status: Never Smoker   Smokeless tobacco: Never Used   Tobacco comment: From spouse. Wife does not smoke around him now  Substance and Sexual Activity   Alcohol use: No    Alcohol/week: 0.0 standard drinks   Drug use: No   Sexual activity: Not on file  Lifestyle   Physical activity:    Days per week: Patient refused    Minutes per session: Patient refused   Stress: Patient refused  Relationships   Social connections:    Talks on phone: Patient refused    Gets together: Patient refused    Attends religious service: Patient refused    Active member of club or organization: Patient refused    Attends meetings of clubs or organizations: Patient refused    Relationship status: Patient refused   Intimate partner violence:    Fear of current or ex partner: Patient refused    Emotionally abused: Patient refused    Physically abused: Patient refused    Forced sexual activity: Patient refused  Other Topics Concern   Not on file  Social History Narrative   Not on file    Family History:   The patient's family history includes Atrial fibrillation in his sister; Depression in his maternal grandfather; Diabetes in his sister and another family member; Heart disease in his  sister and another family member.    ROS:  Please see the history of present illness.  All other ROS reviewed and negative.     Physical Exam/Data:   Vitals:   09/10/18 4193 09/10/18 0711 09/10/18 0717 09/10/18 0730  BP:  114/65 106/61  118/65  Pulse:  (!) 114 (!) 56 (!) 113  Resp: (!) 21 (!) 21 19 16   Temp:      TempSrc:      SpO2:   98% 97%  Weight:      Height:        Intake/Output Summary (Last 24 hours) at 09/10/2018 0800 Last data filed at 09/10/2018 0710 Gross per 24 hour  Intake 100 ml  Output --  Net 100 ml   Last 3 Weights 09/10/2018 08/14/2018 07/20/2018  Weight (lbs) 150 lb 165 lb 174 lb 6.1 oz  Weight (kg) 68.04 kg 74.844 kg 79.1 kg     Body mass index is 19.79 kg/m.  General:  Thin 78 yo in minimal distress   HEENT: normal Lymph: no adenopathy Neck=   JVP to jaw   Endocrine:  No thryomegaly Vascular: No carotid bruits; FA pulses 2+ bilaterally without bruits  Cardiac:  normal S1, S2; RRR; no murmur Tachy   Lungs: Upper airway wheezes   Dicreased BS at bases   Abd: soft, nontender  No masses   Ext: no signif edema  Legs cool    Musculoskeletal:  No deformities, BUE and BLE strength normal and equal  Neuro:  CNs 2-12 intact, no focal abnormalities noted Psych:  Normal affect    EKG:  The ECG that was done 4/10 was personally reviewed and demonstrates Afib 124 bpm    _____________   _____________  2D ECHO 01/27/18 (1 month s/p TAVR) Study Conclusions - Left ventricle: The cavity size was normal. Wall thickness was increased in a pattern of mild LVH. Systolic function was vigorous. The estimated ejection fraction was in the range of 65% to 70%. Wall motion was normal; there were no regional wall motion abnormalities. Doppler parameters are consistent with abnormal left ventricular relaxation (grade 1 diastolic dysfunction). - Aortic valve: A 60mm Sapien 3 Valve TAVR bioprosthesis was present. Peak velocity (S): 196 cm/s. Mean  gradient (S): 9 mm Hg. - Mitral valve: Calcified annulus. Mildly thickened leaflets . There was mild regurgitation. - Tricuspid valve: There was mild regurgitation. Impressions: - Compared to the prior study, there has been no significant interval change  Laboratory Data:  Chemistry Recent Labs  Lab 09/10/18 0533  NA 134*  K 5.1  CL 102  CO2 11*  GLUCOSE 314*  BUN 45*  CREATININE 4.64*  CALCIUM 8.9  GFRNONAA 11*  GFRAA 13*  ANIONGAP >20*    No results for input(s): PROT, ALBUMIN, AST, ALT, ALKPHOS, BILITOT in the last 168 hours. Hematology Recent Labs  Lab 09/10/18 0533  WBC 15.0*  RBC 3.26*  HGB 9.9*  HCT 32.5*  MCV 99.7  MCH 30.4  MCHC 30.5  RDW 17.5*  PLT 235   Cardiac Enzymes Recent Labs  Lab 09/10/18 0533  TROPONINI <0.03   No results for input(s): TROPIPOC in the last 168 hours.  BNP Recent Labs  Lab 09/10/18 0534  BNP 138.0*    DDimer No results for input(s): DDIMER in the last 168 hours.  Radiology/Studies:  Dg Chest Portable 1 View  Result Date: 09/10/2018 CLINICAL DATA:  Shortness of breath today. EXAM: PORTABLE CHEST 1 VIEW COMPARISON:  08/14/2018 FINDINGS: Cardiomegaly as seen previously. Aortic atherosclerosis. The right lung is clear. Question infiltrate or volume loss in the left lower lobe, probably with a small left effusion. Consider two-view chest radiography when able. IMPRESSION: Apparent development of a left effusion with left lower lobe infiltrate and/or atelectasis. Chronic  cardiomegaly. Electronically Signed   By: Nelson Chimes M.D.   On: 09/10/2018 05:53    Assessment and Plan:   Pericardial effusion/tamponade -Pt presented with tamponade and shock. Pericardiocentesis was done in the ED at [facility] with removal 120 cc of dark red, thin fluid. (fluid was accidentally discarded). Immediate improvement in vital signs and dyspnea were reported   Echo now still with signif fluid around heart with evid of tamponad  physiology.Note this is very different from echo done just a few weeks ago on 08/16/18 when small pericardial effusion present I have discussed with P VanTrigt   Plan for drainage with pig tail catheter      Will review with D Ellyn Hack WIll contact [place] as well   2  Oncology    Pt currently receiving chemotherapy at [facility] for metastatic Merkel cell     Will review clinical developments with thim  3  Anemia.    Given 2 U PRBC at [facility]   This added to current volume load but helped while decompensating    Review after pericardiocentesis    Diurese as tolerate  4   REnal   Acute kidney injury   Cr 4.64 from baseline 0.73   Pt has not eatten much   But also hypotensive   WIll need to follow closely    Diurese tolerates    5   Hx HTN  Follow    Hyperlipidemia -On lovastatin 20 mg daily. LDL was 80 in 08/2017. Pt had only mild non-obstructive CAD by cath in 10/2017. Lipids well controlled.   DM type 2 on insulin -Hgb A1c 6.6. Well controlled at <7.  -Will observe AC/HS blood sugars and treat with SSI while in hospital.   S/P TAVR 12/22/2017 -Plavix was stopped 06/25/2018. -On Toprol XL 12.5 mg daily.  -SBE prophylaxis.   Severity of Illness:  Critical  For questions or updates, please contact [place] Please consult [email or web address] for contact info under        Signed, Daune Perch, NP  09/10/2018 8:00 AM    Patient seen and examined   I have extensively amended skeleton note by Maryla Morrow and have inserted my findings above  Dorris Carnes MD

## 2018-09-10 NOTE — ED Notes (Addendum)
Fluids drained by edp was  accidentally put in sharps bin, unable to collect fluid sample for lab

## 2018-09-10 NOTE — Progress Notes (Signed)
Limited echo completed.  Dr. Dorris Carnes beside.

## 2018-09-10 NOTE — Progress Notes (Addendum)
Spoke to pt's granddaughter Jesse Cordova) who has POA    Relayed pt's progress   Now s/p pericardiocentesis  I have also contacted and spoken with [facility]ologist (Stergios Moschos [phone]) He said that pt has had a mixed response with chemotherapy    Some shrinkage.   Pt was due to have chest CT in next couple weeks to reassess mediastinum     He supported drainage, and if necessary pericardial window.   Also, omitted from H and P   Pt's EKG shows atrial fibrillation    Will watch rates closely after drainage  Rx with b blocker as needed     Pt is not a candidate for anticoagulation with known bloody effusion.  Dorris Carnes MD

## 2018-09-10 NOTE — Progress Notes (Signed)
Discussed order for respiratory virus panel and droplet precautions with On Call MD. MD made aware of patients report of dyspnea on admission and current low grade fever. Patient admitted with pericardial effusion and most recent U/A noted with few bacteria. Advised patient current condition does not warrant respiratory virus panel and droplet precautions may be discontinued.

## 2018-09-10 NOTE — ED Notes (Signed)
Date and time results received: 09/10/18 now   (use smartphrase ".now" to insert current time)  Test: lactic  Critical Value: 7.7  Name of Provider Notified:  [place]   Orders Received? Or Actions Taken?: see orders

## 2018-09-10 NOTE — ED Triage Notes (Signed)
Pt arrived via ems for sudden SOB. o2 was in the 70s with ems BP was 70 systolic. Denies being around any one sick.  A&o x4   Pt from home.

## 2018-09-10 NOTE — ED Notes (Signed)
Cardio thoracic paged to Dr Dayna Barker @ [phone] @ [phone]

## 2018-09-10 NOTE — Interval H&P Note (Signed)
History and Physical Interval Note:  09/10/2018 11:50 AM  Jesse Cordova  has presented today for surgery, with the diagnosis of pericardial perfusion with pericardial tamponade physiology.  .  The various methods of treatment have been discussed with the patient and family. After consideration of risks, benefits and other options for treatment, the patient has consented to  Procedure(s): PERICARDIOCENTESIS (N/A) as a surgical intervention.  The patient's history has been reviewed, patient examined, no change in status, stable for surgery.  I have reviewed the patient's chart and labs.  Questions were answered to the patient's satisfaction.     [name]

## 2018-09-10 NOTE — ED Notes (Addendum)
Pericardiocentesis performed by edp. @ [phone]. 124ml removed via 18g in the left chest. Pt tolerated well

## 2018-09-10 NOTE — Progress Notes (Signed)
2D Echocardiogram has been performed.  09/10/2018, 1:02 PM

## 2018-09-11 ENCOUNTER — Inpatient Hospital Stay (HOSPITAL_COMMUNITY): Payer: Medicare Other

## 2018-09-11 DIAGNOSIS — J942 Hemothorax: Secondary | ICD-10-CM

## 2018-09-11 DIAGNOSIS — N179 Acute kidney failure, unspecified: Secondary | ICD-10-CM

## 2018-09-11 LAB — BPAM RBC
Blood Product Expiration Date: 202005012359
Blood Product Expiration Date: 202005012359
ISSUE DATE / TIME: 202004100544
ISSUE DATE / TIME: 202004100544
Unit Type and Rh: 9500
Unit Type and Rh: 9500

## 2018-09-11 LAB — PROTEIN, BODY FLUID (OTHER): Total Protein, Body Fluid Other: 4.4 g/dL

## 2018-09-11 LAB — TYPE AND SCREEN
ABO/RH(D): A POS
Antibody Screen: NEGATIVE
Unit division: 0
Unit division: 0

## 2018-09-11 LAB — CBC
HCT: 34 % — ABNORMAL LOW (ref 39.0–52.0)
HCT: 34.7 % — ABNORMAL LOW (ref 39.0–52.0)
Hemoglobin: 10.7 g/dL — ABNORMAL LOW (ref 13.0–17.0)
Hemoglobin: 10.9 g/dL — ABNORMAL LOW (ref 13.0–17.0)
MCH: 29.1 pg (ref 26.0–34.0)
MCH: 29.1 pg (ref 26.0–34.0)
MCHC: 31.5 g/dL (ref 30.0–36.0)
MCHC: 31.4 g/dL (ref 30.0–36.0)
MCV: 92.4 fL (ref 80.0–100.0)
MCV: 92.5 fL (ref 80.0–100.0)
Platelets: 161 10*3/uL (ref 150–400)
Platelets: 169 10*3/uL (ref 150–400)
RBC: 3.68 MIL/uL — ABNORMAL LOW (ref 4.22–5.81)
RBC: 3.75 MIL/uL — ABNORMAL LOW (ref 4.22–5.81)
RDW: 17.4 % — ABNORMAL HIGH (ref 11.5–15.5)
RDW: 17.5 % — ABNORMAL HIGH (ref 11.5–15.5)
WBC: 11.6 10*3/uL — ABNORMAL HIGH (ref 4.0–10.5)
WBC: 14.1 10*3/uL — ABNORMAL HIGH (ref 4.0–10.5)
nRBC: 0 % (ref 0.0–0.2)
nRBC: 0 % (ref 0.0–0.2)

## 2018-09-11 LAB — BASIC METABOLIC PANEL
Anion gap: 13 (ref 5–15)
BUN: 43 mg/dL — ABNORMAL HIGH (ref 8–23)
CO2: 19 mmol/L — ABNORMAL LOW (ref 22–32)
Calcium: 8.2 mg/dL — ABNORMAL LOW (ref 8.9–10.3)
Chloride: 104 mmol/L (ref 98–111)
Creatinine, Ser: 3.29 mg/dL — ABNORMAL HIGH (ref 0.61–1.24)
GFR calc Af Amer: 20 mL/min — ABNORMAL LOW (ref >60)
GFR calc non Af Amer: 17 mL/min — ABNORMAL LOW (ref >60)
Glucose, Bld: 110 mg/dL — ABNORMAL HIGH (ref 70–99)
Potassium: 4.1 mmol/L (ref 3.5–5.1)
Sodium: 136 mmol/L (ref 135–145)

## 2018-09-11 LAB — LD, BODY FLUID (OTHER): LD, Body Fluid: 718 IU/L

## 2018-09-11 LAB — GLUCOSE, BODY FLUID OTHER??????????: Glucose, Body Fluid Other: 223 mg/dL

## 2018-09-11 LAB — GLUCOSE, BODY FLUID OTHER

## 2018-09-11 LAB — URINE CULTURE: Culture: NO GROWTH

## 2018-09-11 LAB — GLUCOSE, CAPILLARY
Glucose-Capillary: 98 mg/dL (ref 70–99)
Glucose-Capillary: 101 mg/dL — ABNORMAL HIGH (ref 70–99)
Glucose-Capillary: 99 mg/dL (ref 70–99)
Glucose-Capillary: 140 mg/dL — ABNORMAL HIGH (ref 70–99)
Glucose-Capillary: 174 mg/dL — ABNORMAL HIGH (ref 70–99)

## 2018-09-11 LAB — LACTIC ACID, PLASMA: Lactic Acid, Venous: 1.2 mmol/L (ref 0.5–1.9)

## 2018-09-11 MED ORDER — TRAMADOL HCL 50 MG PO TABS: 50.0000 mg | ORAL_TABLET | Freq: Four times a day (QID) | ORAL | Status: DC

## 2018-09-11 MED ORDER — FENTANYL 12 MCG/HR TD PT72
1.0000 | MEDICATED_PATCH | TRANSDERMAL | Status: DC
  Administered 2018-09-11: 1 via TRANSDERMAL
  Filled 2018-09-11 (×2): qty 1

## 2018-09-11 MED ORDER — LIDOCAINE HCL (PF) 1 % IJ SOLN
INTRAMUSCULAR | Status: AC
  Administered 2018-09-11: 08:00:00
  Filled 2018-09-11: qty 30

## 2018-09-11 MED ORDER — OXYCODONE HCL 5 MG PO TABS
5.0000 mg | ORAL_TABLET | ORAL | Status: DC | PRN
  Administered 2018-09-11 (×2): 5 mg via ORAL
  Filled 2018-09-11 (×2): qty 1

## 2018-09-11 MED ORDER — MORPHINE SULFATE (PF) 2 MG/ML IV SOLN
2.0000 mg | INTRAVENOUS | Status: DC | PRN
  Administered 2018-09-11 – 2018-09-14 (×5): 2 mg via INTRAVENOUS
  Filled 2018-09-11 (×5): qty 1

## 2018-09-11 MED ORDER — AMIODARONE HCL IN DEXTROSE 360-4.14 MG/200ML-% IV SOLN
30.0000 mg/h | INTRAVENOUS | Status: DC
  Administered 2018-09-12 – 2018-09-13 (×3): 30 mg/h via INTRAVENOUS
  Filled 2018-09-11 (×3): qty 200

## 2018-09-11 MED ORDER — AMIODARONE IV BOLUS ONLY 150 MG/100ML
150.0000 mg | Freq: Once | INTRAVENOUS | Status: AC
  Administered 2018-09-11: 150 mg via INTRAVENOUS
  Filled 2018-09-11: qty 100

## 2018-09-11 MED ORDER — OXYCODONE HCL 5 MG PO TABS
10.0000 mg | ORAL_TABLET | ORAL | Status: DC | PRN
  Administered 2018-09-12 – 2018-09-13 (×4): 10 mg via ORAL
  Filled 2018-09-11 (×6): qty 2

## 2018-09-11 MED ORDER — MORPHINE SULFATE (PF) 2 MG/ML IV SOLN: 1.0000 mg | Freq: Once | INTRAVENOUS | Status: DC

## 2018-09-11 MED ORDER — MORPHINE SULFATE (PF) 2 MG/ML IV SOLN
INTRAVENOUS | Status: AC
  Administered 2018-09-11: 1 mg via INTRAVENOUS
  Filled 2018-09-11: qty 1

## 2018-09-11 MED ORDER — ACETAMINOPHEN 325 MG PO TABS
650.0000 mg | ORAL_TABLET | Freq: Four times a day (QID) | ORAL | Status: DC | PRN
  Administered 2018-09-11 (×2): 650 mg via ORAL
  Filled 2018-09-11 (×2): qty 2

## 2018-09-11 MED ORDER — AMIODARONE HCL IN DEXTROSE 360-4.14 MG/200ML-% IV SOLN
60.0000 mg/h | INTRAVENOUS | Status: AC
  Administered 2018-09-11 (×2): 60 mg/h via INTRAVENOUS
  Filled 2018-09-11 (×2): qty 200

## 2018-09-11 NOTE — Progress Notes (Signed)
Patient ID: Jesse Cordova, male   DOB: 25-Feb-1941, 78 y.o.   MRN: [id number] TCTS DAILY ICU PROGRESS NOTE                   [address]            [place] [address]          [phone]   1 Day Post-Op Procedure(s) (LRB): PERICARDIOCENTESIS (N/A)  Total Length of Stay:  LOS: 1 day   Subjective: Patient awake alert neurologically intact complaining of left shoulder pain  Objective: Vital signs in last 24 hours: Temp:  [98.3 F (36.8 C)-100.3 F (37.9 C)] 98.4 F (36.9 C) (04/11 0400) Pulse Rate:  [0-156] 111 (04/11 0700) Cardiac Rhythm: Sinus tachycardia (04/11 0400) Resp:  [0-30] 22 (04/11 0700) BP: (93-145)/(55-93) 95/56 (04/11 0700) SpO2:  [0 %-100 %] 95 % (04/11 0700) Weight:  [68.2 kg] 68.2 kg (04/10 0900)  Filed Weights   09/10/18 0541 09/10/18 0900  Weight: 68 kg 68.2 kg    Weight change: 0.16 kg   Hemodynamic parameters for last 24 hours:    Intake/Output from previous day: 04/10 0701 - 04/11 0700 In: 550 [IV Piggyback:100] Out: 370 [Urine:250; Drains:120]  Intake/Output this shift: No intake/output data recorded.  Current Meds: Scheduled Meds: . insulin aspart  0-9 Units Subcutaneous Q4H  . lidocaine (PF)      . mouth rinse  15 mL Mouth Rinse BID   Continuous Infusions: PRN Meds:.acetaminophen  General appearance: alert, cooperative, appears older than stated age and no distress Neurologic: intact Heart: Sinus tachycardia 108 Lungs: diminished breath sounds LLL and LUL Abdomen: soft, non-tender; bowel sounds normal; no masses,  no organomegaly Extremities: extremities normal, atraumatic, no cyanosis or edema and Homans sign is negative, no sign of DVT Wound: Pericardial drain in place  Lab Results: CBC: Recent Labs    09/10/18 0533 09/10/18 1030 09/10/18 1138  WBC 15.0* 12.5*  --   HGB 9.9* 11.0* 10.5*  HCT 32.5* 35.1* 31.0*  PLT 235 157  --    BMET:  Recent Labs    09/10/18 0533 09/10/18 1030 09/10/18 1138   NA 134* 134* 134*  K 5.1 4.9 4.6  CL 102 102  --   CO2 11* 17*  --   GLUCOSE 314* 264*  --   BUN 45* 43*  --   CREATININE 4.64* 4.27*  --   CALCIUM 8.9 8.4*  --     CMET: Lab Results  Component Value Date   WBC 12.5 (H) 09/10/2018   HGB 10.5 (L) 09/10/2018   HCT 31.0 (L) 09/10/2018   PLT 157 09/10/2018   GLUCOSE 264 (H) 09/10/2018   CHOL 137 08/31/2017   TRIG 68 08/31/2017   HDL 43 08/31/2017   LDLCALC 80 08/31/2017   ALT 10 08/14/2018   AST 38 08/14/2018   NA 134 (L) 09/10/2018   K 4.6 09/10/2018   CL 102 09/10/2018   CREATININE 4.27 (H) 09/10/2018   BUN 43 (H) 09/10/2018   CO2 17 (L) 09/10/2018   TSH 5.358 (H) 09/10/2018   PSA 0.81 06/05/2014   INR 1.3 (H) 09/10/2018   HGBA1C 6.6 (A) 03/08/2018   MICROALBUR 0.5 02/20/2016      PT/INR:  Recent Labs    09/10/18 0534  LABPROT 16.2*  INR 1.3*   Radiology: No results found. Chest x-ray done this a.m. shows new left pleural effusion significant in size Acute Kidney Injury (any one)  Increase  in SCr by > 0.3 within 48 hours  Increase SCr to > 1.5 times baseline  Urine volume < 0.5 ml/kg/h for 6 hrs  ?Stage 1 - Increase in serum creatinine to 1.5 to 1.9 times baseline, or increase in serum creatinine by ?0.3 mg/dL (?26.5 micromol/L), or reduction in urine output to <0.5 mL/kg per hour for 6 to 12 hours.  ?Stage 2 - Increase in serum creatinine to 2.0 to 2.9 times baseline, or reduction in urine output to <0.5 mL/kg per hour for ?12 hours.  ?Stage 3 - Increase in serum creatinine to 3.0 times baseline, or increase in serum creatinine to ?4.0 mg/dL (?353.6 micromol/L), or reduction in urine output to <0.3 mL/kg per hour for ?24 hours, or anuria for ?12 hours, or the initiation of renal replacement therapy, or, in patients <18 years, decrease in eGFR to <35 mL   Lab Results  Component Value Date   CREATININE 4.27 (H) 09/10/2018   Estimated Creatinine Clearance: 13.8 mL/min (A) (by C-G formula based on SCr of  4.27 mg/dL (H)).  Assessment/Plan: S/P Procedure(s) (LRB): PERICARDIOCENTESIS (N/A) #1 new left pleural effusion likely related to the previous pericardiocentesis, especially the attempt through the left chest and lung #2 pericardial drain placed by interventional cardiology yesterday initial 650 mL drained this is tapered down now #3 acute renal failure   Plan stat CBC and BMET discussed with patient and his granddaughter Deberah Castle has healthcare power of attorney placement of a left chest tube both are agreeable Discussed with [name] with the patient's desires as far as CODE STATUS -currently no decision but this needs to be addressed  Grace Isaac 09/11/2018 8:38 AM   See procedure note left chest tube placed with drainage of 2 L of bloody thin pleural fluid Follow-up chest x-ray shows clear lung fields bilaterally tube in good position  Grace Isaac MD      [address] [place] [address] Office [phone]   Beeper [phone]    Right chest tube placed

## 2018-09-11 NOTE — Progress Notes (Signed)
Fentanyl patch taken off pt per order and d/c'd into sharps as witnessed by Lindwood Coke, RN.

## 2018-09-11 NOTE — Progress Notes (Signed)
Dr. Margaretann Loveless notified of afib 130's noted on bedside ekg.  Spo2= 96% 3LNC.  No distress noted.  No orders received.  Will continue to closely monitor.

## 2018-09-11 NOTE — Progress Notes (Signed)
Dr Curt Bears at bedside.  Pt states he wishes to be full code.

## 2018-09-11 NOTE — Progress Notes (Signed)
Progress Note  Patient Name: Jesse Cordova Date of Encounter: 09/11/2018  Primary Cardiologist: Verdie Shire  Subjective   Pericardial drain placed yesterday.  Patient did have an attempt at pericardiocentesis at an outside emergency room prior to being transferred.  Patient was noted to have a large left-sided hemothorax and now status post chest tube by Dr. Pia Mau.  Patient complains of no further shortness of breath.  Is certainly improved.  Feeling well post chest tube placement.  Inpatient Medications    Scheduled Meds: . insulin aspart  0-9 Units Subcutaneous Q4H  . lidocaine (PF)      . mouth rinse  15 mL Mouth Rinse BID   Continuous Infusions:  PRN Meds: acetaminophen   Vital Signs    Vitals:   09/11/18 0400 09/11/18 0500 09/11/18 0600 09/11/18 0700  BP: (!) 103/59 108/70 106/69 (!) 95/56  Pulse: (!) 107 (!) 103 (!) 105 (!) 111  Resp: 19 17 15  (!) 22  Temp: 98.4 F (36.9 C)     TempSrc: Axillary     SpO2: 96% 96% 96% 95%  Weight:      Height:        Intake/Output Summary (Last 24 hours) at 09/11/2018 0837 Last data filed at 09/11/2018 0000 Gross per 24 hour  Intake 450 ml  Output 370 ml  Net 80 ml   Last 3 Weights 09/10/2018 09/10/2018 08/14/2018  Weight (lbs) 150 lb 5.7 oz 150 lb 165 lb  Weight (kg) 68.2 kg 68.04 kg 74.844 kg      Telemetry    Sinus tachycardia - Personally Reviewed  ECG    Atrial fibrillation, rate 124- Personally Reviewed  Physical Exam   GEN: No acute distress.   Neck: No JVD Cardiac:  Tachycardic, regular, no murmurs, rubs, or gallops.  Respiratory: Clear to auscultation bilaterally. GI: Soft, nontender, non-distended  MS: No edema; No deformity. Neuro:  Nonfocal  Psych: Normal affect   Labs    Chemistry Recent Labs  Lab 09/10/18 0533 09/10/18 1030 09/10/18 1138  NA 134* 134* 134*  K 5.1 4.9 4.6  CL 102 102  --   CO2 11* 17*  --   GLUCOSE 314* 264*  --   BUN 45* 43*  --   CREATININE 4.64* 4.27*   --   CALCIUM 8.9 8.4*  --   GFRNONAA 11* 12*  --   GFRAA 13* 14*  --   ANIONGAP >20* 15  --      Hematology Recent Labs  Lab 09/10/18 0533 09/10/18 1030 09/10/18 1138  WBC 15.0* 12.5*  --   RBC 3.26* 3.76*  --   HGB 9.9* 11.0* 10.5*  HCT 32.5* 35.1* 31.0*  MCV 99.7 93.4  --   MCH 30.4 29.3  --   MCHC 30.5 31.3  --   RDW 17.5* 17.0*  --   PLT 235 157  --     Cardiac Enzymes Recent Labs  Lab 09/10/18 0533  TROPONINI <0.03   No results for input(s): TROPIPOC in the last 168 hours.   BNP Recent Labs  Lab 09/10/18 0534  BNP 138.0*     DDimer No results for input(s): DDIMER in the last 168 hours.   Radiology    Dg Chest Portable 1 View  Result Date: 09/10/2018 CLINICAL DATA:  Shortness of breath today. EXAM: PORTABLE CHEST 1 VIEW COMPARISON:  08/14/2018 FINDINGS: Cardiomegaly as seen previously. Aortic atherosclerosis. The right lung is clear. Question infiltrate or volume loss in the left  lower lobe, probably with a small left effusion. Consider two-view chest radiography when able. IMPRESSION: Apparent development of a left effusion with left lower lobe infiltrate and/or atelectasis. Chronic cardiomegaly. Electronically Signed   By: Nelson Chimes M.D.   On: 09/10/2018 05:53    Cardiac Studies   TTE 09/10/18  1. A 109mm an Wende Crease Sapien bioprosthetic aortic valve (TAVR) valve is present in the aortic position.  2. Limited echo for pericardial effusion/pericardiocentesis. Prior to procedure large circumferential effusion with RV diastolic collapse and tamponade. Pericardial space entrance confirmed by agitated saline. Post procedure trivial residual pericardial  effusion with persisting large left pleural effusion. RV diastolic collapse also gone.  Patient Profile     79 y.o. male with a history of type 2 diabetes, hypertension, hyperlipidemia, severe left ear status post TAVR as well as Merkel cell cancer getting chemo at [facility].  Presented to the hospital with  respiratory distress found to have a large pericardial effusion/tamponade pericardiocentesis done emergency room with 120 cc of fluid removed  Assessment & Plan    1.  Pericardial effusion: Pericardiocentesis done at any [facility] with 100 cc of dark red thin fluid removed.  Patient had pericardiocentesis done in the cardiac Cath Lab with a pigtail catheter placed.  Sent off for review to the lab.  Awaiting results.  2.  Merkel cell cancer, metastatic: Patient receiving chemotherapy at [facility].  Has had a mixed response to chemo.  3.  Acute renal failure: Likely due to pericardial effusion.  Creatinine was 4.27 yesterday and was normal previously.   recheck today.  4.  Hemothorax: Patient status post chest tube due to large hemothorax.   recheck CBC.    5.  Hypertension: Continue to follow.  6.  Hyperlipidemia: Appears well controlled on lovastatin  7.  Type 2 diabetes: A1c 6.6.  Current management with before meals and at bedtime blood sugars and sliding scale  8.  Status post TAVR: SBE prophylaxis.  For questions or updates, please contact [place] Please consult [email or web address] for contact info under        Signed,  Meredith Leeds, MD  09/11/2018, 8:37 AM

## 2018-09-11 NOTE — Procedures (Signed)
      [address]       [place] [address]             [phone]    Chest Tube Insertion Procedure Note  Indications:  Clinically significant Hemothorax  Pre-operative Diagnosis: Hemothorax  Post-operative Diagnosis: Hemothorax  Procedure Details  Informed consent was obtained for the procedure, including sedation.  Risks of lung perforation, hemorrhage, arrhythmia, and adverse drug reaction were discussed.   After sterile skin prep, using standard technique, a 28 French tube was placed in the left lateral 6 rib space.  Findings: 2 L bloody pleural fluid drained  Estimated Blood Loss:  Minimal         Specimens:  None              Complications:  None; patient tolerated the procedure well.         Disposition: ICU - extubated and stable.         Condition: stable  Attending Attestation: I performed the procedure.

## 2018-09-11 NOTE — Progress Notes (Signed)
Assisted tele visit to patient with family member (granddaughter).  Jesse Cordova, Janace Hoard, RN

## 2018-09-11 NOTE — Progress Notes (Signed)
Patient continues to report pain with movement of left shoulder. Patient reports recently having rolled out of bed and possible landing on arm/shoulder. On call MD notified and updated. Order received for PRN Tylenol to be administered. MD advises further work up could be performed if pain continues and unrelieved with medication. On call MD also made aware of last Lactic Acid result with no repeats ordered. Order received for repeat lactic acid level with AM labs.

## 2018-09-12 ENCOUNTER — Inpatient Hospital Stay (HOSPITAL_COMMUNITY): Payer: Medicare Other

## 2018-09-12 LAB — BASIC METABOLIC PANEL
Anion gap: 12 (ref 5–15)
BUN: 43 mg/dL — ABNORMAL HIGH (ref 8–23)
CO2: 16 mmol/L — ABNORMAL LOW (ref 22–32)
Calcium: 8.1 mg/dL — ABNORMAL LOW (ref 8.9–10.3)
Chloride: 106 mmol/L (ref 98–111)
Creatinine, Ser: 2.14 mg/dL — ABNORMAL HIGH (ref 0.61–1.24)
GFR calc Af Amer: 33 mL/min — ABNORMAL LOW (ref >60)
GFR calc non Af Amer: 29 mL/min — ABNORMAL LOW (ref >60)
Glucose, Bld: 125 mg/dL — ABNORMAL HIGH (ref 70–99)
Potassium: 4.2 mmol/L (ref 3.5–5.1)
Sodium: 134 mmol/L — ABNORMAL LOW (ref 135–145)

## 2018-09-12 LAB — GLUCOSE, CAPILLARY
Glucose-Capillary: 191 mg/dL — ABNORMAL HIGH (ref 70–99)
Glucose-Capillary: 118 mg/dL — ABNORMAL HIGH (ref 70–99)
Glucose-Capillary: 154 mg/dL — ABNORMAL HIGH (ref 70–99)
Glucose-Capillary: 151 mg/dL — ABNORMAL HIGH (ref 70–99)
Glucose-Capillary: 117 mg/dL — ABNORMAL HIGH (ref 70–99)
Glucose-Capillary: 164 mg/dL — ABNORMAL HIGH (ref 70–99)

## 2018-09-12 LAB — CBC
HCT: 32.7 % — ABNORMAL LOW (ref 39.0–52.0)
Hemoglobin: 11.1 g/dL — ABNORMAL LOW (ref 13.0–17.0)
MCH: 30.7 pg (ref 26.0–34.0)
MCHC: 33.9 g/dL (ref 30.0–36.0)
MCV: 90.3 fL (ref 80.0–100.0)
Platelets: 119 10*3/uL — ABNORMAL LOW (ref 150–400)
RBC: 3.62 MIL/uL — ABNORMAL LOW (ref 4.22–5.81)
RDW: 17.6 % — ABNORMAL HIGH (ref 11.5–15.5)
WBC: 10.9 10*3/uL — ABNORMAL HIGH (ref 4.0–10.5)
nRBC: 0 % (ref 0.0–0.2)

## 2018-09-12 NOTE — Progress Notes (Signed)
Patient reports severe pain at chest tube site. Patient states its greater than 10. Patient states "I cant take this," and requests for hospice to get involved. Patient educated on palliative services as an option for inpatient symptom management and to assist with determining comfort goals. Patient asked about his wishes for chest compressions if a need were to arise; patient reports not wanting chest compressions. On call MD notified and updated regarding patients statements and complaints of pain. Pain medication orders adjusted and received. Code status and palliative care consult postponed as patients decisions may be clouded by acute pain. Will continue to monitor.

## 2018-09-12 NOTE — Plan of Care (Signed)
  Problem: Education: Goal: Knowledge of General Education information will improve Description Including pain rating scale, medication(s)/side effects and non-pharmacologic comfort measures Outcome: Progressing   Problem: Health Behavior/Discharge Planning: Goal: Ability to manage health-related needs will improve Outcome: Progressing   Problem: Clinical Measurements: Goal: Ability to maintain clinical measurements within normal limits will improve Outcome: Progressing Goal: Will remain free from infection Outcome: Progressing Goal: Diagnostic test results will improve Outcome: Progressing Goal: Respiratory complications will improve Outcome: Progressing Goal: Cardiovascular complication will be avoided Outcome: Progressing   Problem: Activity: Goal: Risk for activity intolerance will decrease Outcome: Progressing   Problem: Nutrition: Goal: Adequate nutrition will be maintained Outcome: Progressing   Problem: Coping: Goal: Level of anxiety will decrease Outcome: Progressing   Problem: Elimination: Goal: Will not experience complications related to bowel motility Outcome: Progressing Goal: Will not experience complications related to urinary retention Outcome: Progressing   Problem: Pain Managment: Goal: General experience of comfort will improve Outcome: Progressing   Problem: Safety: Goal: Ability to remain free from injury will improve Outcome: Progressing   Problem: Skin Integrity: Goal: Risk for impaired skin integrity will decrease Outcome: Progressing   Problem: Education: Goal: Knowledge of the prescribed therapeutic regimen will improve Outcome: Progressing   Problem: Activity: Goal: Risk for activity intolerance will decrease Outcome: Progressing   Problem: Cardiac: Goal: Will achieve and/or maintain hemodynamic stability Outcome: Progressing   Problem: Clinical Measurements: Goal: Postoperative complications will be avoided or  minimized Outcome: Progressing   Problem: Respiratory: Goal: Respiratory status will improve Outcome: Progressing   Problem: Skin Integrity: Goal: Wound healing without signs and symptoms of infection Outcome: Progressing Goal: Risk for impaired skin integrity will decrease Outcome: Progressing   Problem: Urinary Elimination: Goal: Ability to achieve and maintain adequate renal perfusion and functioning will improve Outcome: Progressing

## 2018-09-12 NOTE — Progress Notes (Signed)
Patient ID: AMOR PACKARD, male   DOB: [birth year], 78 y.o.   MRN: [id number] TCTS DAILY ICU PROGRESS NOTE                   [address]            ,[place] [address]          [phone]   2 Days Post-Op Procedure(s) (LRB): PERICARDIOCENTESIS (N/A)  Total Length of Stay:  LOS: 2 days   Subjective: Awake and neuro intact, feels better then yesterday morning , converted to sinus 10 pm on icv Cordarone   Objective: Vital signs in last 24 hours: Temp:  [97.4 F (36.3 C)-98 F (36.7 C)] 97.5 F (36.4 C) (04/12 0400) Pulse Rate:  [92-135] 94 (04/12 0400) Cardiac Rhythm: Normal sinus rhythm (04/12 0400) Resp:  [12-29] 13 (04/12 0400) BP: (84-107)/(52-69) 105/63 (04/12 0400) SpO2:  [91 %-100 %] 99 % (04/12 0400)  Filed Weights   09/10/18 0541 09/10/18 0900  Weight: 68 kg 68.2 kg    Weight change:    Hemodynamic parameters for last 24 hours:    Intake/Output from previous day: 04/11 0701 - 04/12 0700 In: 654.6 [P.O.:240; I.V.:414.6] Out: 3230 [Urine:570; Drains:170; Chest Tube:2490]  Intake/Output this shift: No intake/output data recorded.  Current Meds: Scheduled Meds: . insulin aspart  0-9 Units Subcutaneous Q4H  . mouth rinse  15 mL Mouth Rinse BID  .  morphine injection  1 mg Intravenous Once   Continuous Infusions: . amiodarone 30 mg/hr (09/12/18 0400)   PRN Meds:.acetaminophen, morphine injection, oxyCODONE  General appearance: alert, cooperative and no distress Neurologic: intact Heart: regular rate and rhythm, S1, S2 normal, no murmur, click, rub or gallop Lungs: diminished breath sounds bibasilar Abdomen: soft, non-tender; bowel sounds normal; no masses,  no organomegaly Extremities: extremities normal, atraumatic, no cyanosis or edema and Homans sign is negative, no sign of DVT Wound: Left chest tube and pericardial drains are in place  Lab Results: CBC: Recent Labs    09/11/18 1922 09/12/18 0453  WBC 14.1* 10.9*  HGB 10.9*  11.1*  HCT 34.7* 32.7*  PLT 169 119*   BMET:  Recent Labs    09/11/18 0752 09/12/18 0453  NA 136 134*  K 4.1 4.2  CL 104 106  CO2 19* 16*  GLUCOSE 110* 125*  BUN 43* 43*  CREATININE 3.29* 2.14*  CALCIUM 8.2* 8.1*    CMET: Lab Results  Component Value Date   WBC 10.9 (H) 09/12/2018   HGB 11.1 (L) 09/12/2018   HCT 32.7 (L) 09/12/2018   PLT 119 (L) 09/12/2018   GLUCOSE 125 (H) 09/12/2018   CHOL 137 08/31/2017   TRIG 68 08/31/2017   HDL 43 08/31/2017   LDLCALC 80 08/31/2017   ALT 10 08/14/2018   AST 38 08/14/2018   NA 134 (L) 09/12/2018   K 4.2 09/12/2018   CL 106 09/12/2018   CREATININE 2.14 (H) 09/12/2018   BUN 43 (H) 09/12/2018   CO2 16 (L) 09/12/2018   TSH 5.358 (H) 09/10/2018   PSA 0.81 06/05/2014   INR 1.3 (H) 09/10/2018   HGBA1C 6.6 (A) 03/08/2018   MICROALBUR 0.5 02/20/2016      PT/INR:  Recent Labs    09/10/18 0534  LABPROT 16.2*  INR 1.3*   Radiology: Dg Chest Port 1 View  Result Date: 09/11/2018 CLINICAL DATA:  Pericardial drain.  Chest tube placement. EXAM: PORTABLE CHEST 1 VIEW COMPARISON:  Earlier today FINDINGS: A left  chest tube has been placed. There is near complete resolution of the left pleural effusion. There is residual atelectasis at the left base. Pericardial drain remains in place. Right lung is clear. Normal heart size. IMPRESSION: Left chest tube placement with near complete resolution of the left pleural effusion. Electronically Signed   By: Marybelle Killings M.D.   On: 09/11/2018 09:56   Chest xray this not read yet but similar to film after ct placement   Assessment/Plan: S/P Procedure(s) (LRB): PERICARDIOCENTESIS (N/A)  170 from pericardial drain over 24 hours 2049  Milliliters from left chest tube for 24 hours-majority of this was on initial placement of the tube, 240 ml past 12 hours  No results on cytology from pericardial drain yet, pleural fluid was not sent for cytology as this was sudden and procedure related not chronic  on admision Will leave left chest tube today Pericardial drain per cardiology but with 170 consider leaving additional day  Acute renal failure is improving  Cr now down to 2.14     Grace Isaac 09/12/2018 7:34 AM

## 2018-09-12 NOTE — Progress Notes (Signed)
Progress Note  Patient Name: Jesse Cordova Date of Encounter: 09/12/2018  Primary Cardiologist: Verdie Shire  Subjective   Continues to have output from pericardial and pleural drain.  Did go into atrial fibrillation yesterday and was started on IV amiodarone.  Converted to sinus rhythm at 2205 yesterday evening.  Is feeling better today with less shortness of breath.  Inpatient Medications    Scheduled Meds: . insulin aspart  0-9 Units Subcutaneous Q4H  . mouth rinse  15 mL Mouth Rinse BID  .  morphine injection  1 mg Intravenous Once   Continuous Infusions: . amiodarone 30 mg/hr (09/12/18 0400)   PRN Meds: acetaminophen, morphine injection, oxyCODONE   Vital Signs    Vitals:   09/12/18 0400 09/12/18 0500 09/12/18 0600 09/12/18 0700  BP: 105/63 (!) 115/59 108/64   Pulse: 94 95 97 96  Resp: 13 12 12 14   Temp: (!) 97.5 F (36.4 C)     TempSrc: Oral     SpO2: 99% 97% 99% 98%  Weight:      Height:        Intake/Output Summary (Last 24 hours) at 09/12/2018 0759 Last data filed at 09/12/2018 0600 Gross per 24 hour  Intake 654.55 ml  Output 3230 ml  Net -2575.45 ml   Last 3 Weights 09/10/2018 09/10/2018 08/14/2018  Weight (lbs) 150 lb 5.7 oz 150 lb 165 lb  Weight (kg) 68.2 kg 68.04 kg 74.844 kg      Telemetry    Sinus rhythm- Personally Reviewed  ECG    None new- Personally Reviewed  Physical Exam   GEN: Well nourished, well developed, in no acute distress  HEENT: normal  Neck: no JVD, carotid bruits, or masses Cardiac: RRR; no murmurs, rubs, or gallops,no edema  Respiratory:  clear to auscultation bilaterally, normal work of breathing GI: soft, nontender, nondistended, + BS MS: no deformity or atrophy  Skin: warm and dry Neuro:  Strength and sensation are intact Psych: euthymic mood, full affect   Labs    Chemistry Recent Labs  Lab 09/10/18 1030 09/10/18 1138 09/11/18 0752 09/12/18 0453  NA 134* 134* 136 134*  K 4.9 4.6 4.1 4.2  CL  102  --  104 106  CO2 17*  --  19* 16*  GLUCOSE 264*  --  110* 125*  BUN 43*  --  43* 43*  CREATININE 4.27*  --  3.29* 2.14*  CALCIUM 8.4*  --  8.2* 8.1*  GFRNONAA 12*  --  17* 29*  GFRAA 14*  --  20* 33*  ANIONGAP 15  --  13 12     Hematology Recent Labs  Lab 09/11/18 0752 09/11/18 1922 09/12/18 0453  WBC 11.6* 14.1* 10.9*  RBC 3.68* 3.75* 3.62*  HGB 10.7* 10.9* 11.1*  HCT 34.0* 34.7* 32.7*  MCV 92.4 92.5 90.3  MCH 29.1 29.1 30.7  MCHC 31.5 31.4 33.9  RDW 17.4* 17.5* 17.6*  PLT 161 169 119*    Cardiac Enzymes Recent Labs  Lab 09/10/18 0533  TROPONINI <0.03   No results for input(s): TROPIPOC in the last 168 hours.   BNP Recent Labs  Lab 09/10/18 0534  BNP 138.0*     DDimer No results for input(s): DDIMER in the last 168 hours.   Radiology    Dg Chest Port 1 View  Result Date: 09/12/2018 CLINICAL DATA:  Chest tube.  Pericardial drain EXAM: PORTABLE CHEST 1 VIEW COMPARISON:  09/11/2018 FINDINGS: Left chest tube remains in place. No pneumothorax.  Pericardial drain remains in place unchanged in position. TAVR unchanged in position Mild left lower lobe atelectasis improved. Minimal left effusion. Right lung remains clear. IMPRESSION: Improvement in mild left lower lobe atelectasis. Minimal left effusion. No pneumothorax. Right lung remains clear. Electronically Signed   By: Franchot Gallo M.D.   On: 09/12/2018 07:44   Dg Chest Port 1 View  Result Date: 09/11/2018 CLINICAL DATA:  Pericardial drain.  Chest tube placement. EXAM: PORTABLE CHEST 1 VIEW COMPARISON:  Earlier today FINDINGS: A left chest tube has been placed. There is near complete resolution of the left pleural effusion. There is residual atelectasis at the left base. Pericardial drain remains in place. Right lung is clear. Normal heart size. IMPRESSION: Left chest tube placement with near complete resolution of the left pleural effusion. Electronically Signed   By: Marybelle Killings M.D.   On: 09/11/2018 09:56    Dg Chest Port 1 View  Result Date: 09/11/2018 CLINICAL DATA:  Malignant pericardial effusion EXAM: PORTABLE CHEST 1 VIEW COMPARISON:  09/10/2018 FINDINGS: A pericardial drain projects over the mediastinum. Aortic valve replacement hardware is in place. A large left pleural effusion is not present. There is haziness throughout the left hemithorax with relative aeration in the left upper lobe. Right lung remains clear. No pneumothorax. IMPRESSION: Large left pleural effusion. Pericardial drain is in place. Electronically Signed   By: Marybelle Killings M.D.   On: 09/11/2018 09:56    Cardiac Studies   TTE 09/10/18  1. A 38mm an Wende Crease Sapien bioprosthetic aortic valve (TAVR) valve is present in the aortic position.  2. Limited echo for pericardial effusion/pericardiocentesis. Prior to procedure large circumferential effusion with RV diastolic collapse and tamponade. Pericardial space entrance confirmed by agitated saline. Post procedure trivial residual pericardial  effusion with persisting large left pleural effusion. RV diastolic collapse also gone.  Patient Profile     78 y.o. male with a history of type 2 diabetes, hypertension, hyperlipidemia, severe left ear status post TAVR as well as Merkel cell cancer getting chemo at [facility].  Presented to the hospital with respiratory distress found to have a large pericardial effusion/tamponade pericardiocentesis done emergency room with 120 cc of fluid removed  Assessment & Plan    1.  Pericardial effusion with tamponade physiology: Continues to drain fluid with 170 cc out over the day yesterday.  Linwood Gullikson leave pericardial drain in place today and potentially pull tomorrow.  Continuing to await for cytology results.    2.  Merkel cell cancer, metastatic: Patient receiving chemotherapy at [facility].  Has had a mixed response to chemo.    3.  Acute renal failure: Has a normal creatinine at baseline.  Creatinine down to 2.14 as of this morning.  Likely due to  tamponade.  4.  Hemothorax: Status post chest tube by Dr. Servando Snare.  Plan per TCV.  5.  Hypertension: Blood pressure well controlled  6.  Hyperlipidemia: Continue lovastatin  7.  Type 2 diabetes: A1c 6.6.  Continue with current management.  8.  Status post TAVR: Continue SBE prophylaxis  For questions or updates, please contact [place] Please consult [email or web address] for contact info under        Signed, Rosaisela Jamroz Meredith Leeds, MD  09/12/2018, 7:59 AM

## 2018-09-13 ENCOUNTER — Telehealth: Payer: Self-pay | Admitting: Family Medicine

## 2018-09-13 ENCOUNTER — Encounter (HOSPITAL_COMMUNITY): Payer: Self-pay | Admitting: Cardiology

## 2018-09-13 ENCOUNTER — Inpatient Hospital Stay (HOSPITAL_COMMUNITY): Payer: Medicare Other

## 2018-09-13 DIAGNOSIS — Z7189 Other specified counseling: Secondary | ICD-10-CM

## 2018-09-13 DIAGNOSIS — Z515 Encounter for palliative care: Secondary | ICD-10-CM

## 2018-09-13 DIAGNOSIS — I313 Pericardial effusion (noninflammatory): Secondary | ICD-10-CM

## 2018-09-13 DIAGNOSIS — I314 Cardiac tamponade: Secondary | ICD-10-CM

## 2018-09-13 DIAGNOSIS — C801 Malignant (primary) neoplasm, unspecified: Secondary | ICD-10-CM

## 2018-09-13 LAB — BASIC METABOLIC PANEL
Anion gap: 11 (ref 5–15)
BUN: 28 mg/dL — ABNORMAL HIGH (ref 8–23)
CO2: 17 mmol/L — ABNORMAL LOW (ref 22–32)
Calcium: 8.1 mg/dL — ABNORMAL LOW (ref 8.9–10.3)
Chloride: 103 mmol/L (ref 98–111)
Creatinine, Ser: 1.15 mg/dL (ref 0.61–1.24)
GFR calc Af Amer: >60 mL/min (ref >60)
GFR calc non Af Amer: >60 mL/min (ref >60)
Glucose, Bld: 156 mg/dL — ABNORMAL HIGH (ref 70–99)
Potassium: 4 mmol/L (ref 3.5–5.1)
Sodium: 131 mmol/L — ABNORMAL LOW (ref 135–145)

## 2018-09-13 LAB — PH, BODY FLUID: pH, Body Fluid: 7.4

## 2018-09-13 LAB — GLUCOSE, CAPILLARY
Glucose-Capillary: 94 mg/dL (ref 70–99)
Glucose-Capillary: 135 mg/dL — ABNORMAL HIGH (ref 70–99)
Glucose-Capillary: 148 mg/dL — ABNORMAL HIGH (ref 70–99)
Glucose-Capillary: 142 mg/dL — ABNORMAL HIGH (ref 70–99)
Glucose-Capillary: 154 mg/dL — ABNORMAL HIGH (ref 70–99)
Glucose-Capillary: 138 mg/dL — ABNORMAL HIGH (ref 70–99)
Glucose-Capillary: 179 mg/dL — ABNORMAL HIGH (ref 70–99)

## 2018-09-13 LAB — CBC
HCT: 33.9 % — ABNORMAL LOW (ref 39.0–52.0)
Hemoglobin: 11.1 g/dL — ABNORMAL LOW (ref 13.0–17.0)
MCH: 29.7 pg (ref 26.0–34.0)
MCHC: 32.7 g/dL (ref 30.0–36.0)
MCV: 90.6 fL (ref 80.0–100.0)
Platelets: 159 10*3/uL (ref 150–400)
RBC: 3.74 MIL/uL — ABNORMAL LOW (ref 4.22–5.81)
RDW: 17.2 % — ABNORMAL HIGH (ref 11.5–15.5)
WBC: 10 10*3/uL (ref 4.0–10.5)
nRBC: 0 % (ref 0.0–0.2)

## 2018-09-13 MED ORDER — AMIODARONE HCL 200 MG PO TABS
200.0000 mg | ORAL_TABLET | Freq: Two times a day (BID) | ORAL | Status: DC
  Administered 2018-09-13 – 2018-09-14 (×3): 200 mg via ORAL
  Filled 2018-09-13 (×3): qty 1

## 2018-09-13 NOTE — Progress Notes (Addendum)
TCTS DAILY ICU PROGRESS NOTE                   [address]            [place] [address]          [phone]   3 Days Post-Op Procedure(s) (LRB): PERICARDIOCENTESIS (N/A)  Total Length of Stay:  LOS: 3 days   Subjective: Appears comfortable currently Some sputum production per patient Feels anxious at times  Objective: Vital signs in last 24 hours: Temp:  [97 F (36.1 C)-98.8 F (37.1 C)] 97.5 F (36.4 C) (04/13 0400) Pulse Rate:  [92-98] 95 (04/13 0600) Cardiac Rhythm: Normal sinus rhythm (04/13 0400) Resp:  [10-20] 11 (04/13 0600) BP: (93-119)/(60-75) 99/60 (04/13 0600) SpO2:  [97 %-99 %] 98 % (04/13 0600)  Filed Weights   09/10/18 0541 09/10/18 0900  Weight: 68 kg 68.2 kg    Weight change:    Hemodynamic parameters for last 24 hours:    Intake/Output from previous day: 04/12 0701 - 04/13 0700 In: 381 [I.V.:381] Out: 760 [Urine:525; Drains:25; Chest Tube:210]  Intake/Output this shift: No intake/output data recorded.  Current Meds: Scheduled Meds: . insulin aspart  0-9 Units Subcutaneous Q4H  . mouth rinse  15 mL Mouth Rinse BID  .  morphine injection  1 mg Intravenous Once   Continuous Infusions: . amiodarone 30 mg/hr (09/13/18 0653)   PRN Meds:.acetaminophen, morphine injection, oxyCODONE  General appearance: alert, cooperative, no distress and chronically ill Heart: regular rate and rhythm Lungs: clear anterio-lat Abdomen: soft nontender Extremities: PAS in place, no edema Wound: drain sites look fine  Lab Results: CBC: Recent Labs    09/11/18 1922 09/12/18 0453  WBC 14.1* 10.9*  HGB 10.9* 11.1*  HCT 34.7* 32.7*  PLT 169 119*   BMET:  Recent Labs    09/11/18 0752 09/12/18 0453  NA 136 134*  K 4.1 4.2  CL 104 106  CO2 19* 16*  GLUCOSE 110* 125*  BUN 43* 43*  CREATININE 3.29* 2.14*  CALCIUM 8.2* 8.1*    CMET: Lab Results  Component Value Date   WBC 10.9 (H) 09/12/2018   HGB 11.1 (L) 09/12/2018   HCT  32.7 (L) 09/12/2018   PLT 119 (L) 09/12/2018   GLUCOSE 125 (H) 09/12/2018   CHOL 137 08/31/2017   TRIG 68 08/31/2017   HDL 43 08/31/2017   LDLCALC 80 08/31/2017   ALT 10 08/14/2018   AST 38 08/14/2018   NA 134 (L) 09/12/2018   K 4.2 09/12/2018   CL 106 09/12/2018   CREATININE 2.14 (H) 09/12/2018   BUN 43 (H) 09/12/2018   CO2 16 (L) 09/12/2018   TSH 5.358 (H) 09/10/2018   PSA 0.81 06/05/2014   INR 1.3 (H) 09/10/2018   HGBA1C 6.6 (A) 03/08/2018   MICROALBUR 0.5 02/20/2016      PT/INR: No results for input(s): LABPROT, INR in the last 72 hours. Radiology: No results found.   Assessment/Plan: S/P Procedure(s) (LRB): PERICARDIOCENTESIS (N/A)  1 hemodyn stable in sinus rhythm- on amio Gtt, SBP 90's to low 100's, Afebrile 2 sats good on RA 3 pericardial drain 25cc/24 hours, prob remove today per cardiology 4 CT - 210 cc/24 hours- leave for now 5 CXR - stable in appearance 6 no new labs 7 BS control is fine, HgA1C 6.6 8 UOP fair , cont to monitor  9 cytology pending Jesse Cordova [facility] 09/13/2018 7:29 AM  Pager [phone]   Patient examined  and CXR image reviewed Bloody L pleural effusion- L chest tube placed 4-11. Will place to water seal today and followup pleural fluid cytology. If malignant may benefit from talc chemical pleurodesis before removal of tube  patient examined and medical record reviewed,agree with above note. Tharon Aquas Trigt III 09/13/2018

## 2018-09-13 NOTE — Telephone Encounter (Signed)
Call ems on Friday because he was having chest pain and sob. Kidneys are shutting down, stacey wants to talk to dr scott about changing his code status and wants to know dr scott's opinion.  Pt told stacey over the phone because she cant go in to see him that he just wants comfort care done.

## 2018-09-13 NOTE — Telephone Encounter (Signed)
I had good discussion with the granddaughter.  They are leaning toward DNR status.  They are also leaning toward comfort care but will be thinking about it greater and will discuss it with hospital

## 2018-09-13 NOTE — Telephone Encounter (Signed)
Pt's granddaughter Erline Levine would like to talk with Dr. Nicki Reaper in regards to the hospital wanting to change his code status.   CB# [id number].

## 2018-09-13 NOTE — Evaluation (Signed)
Physical Therapy Evaluation Patient Details Name: Jesse Cordova MRN: [id number] DOB: [birth year] Today's Date: 09/13/2018   History of Present Illness   78 y.o. male with a history of type 2 diabetes, hypertension, hyperlipidemia, severe left ear status post TAVR as well as Merkel cell cancer getting chemo at [facility].  Presented to the hospital with respiratory distress found to have a large pericardial effusion/tamponade pericardiocentesis done emergency room with 120 cc of fluid removed  Pericardial drain removed 4/13, CT in place left side.   Clinical Impression  Pt admitted with above diagnosis. Pt currently with functional limitations due to the deficits listed below (see PT Problem List). Pt was able to sit EOB with mod to max assist of 2 people with posterior and right lean. Max assist of 2 to come to EOB as well.  Pt fatigues quickly and will need incr assist at home. If family wants to take pt home, will need [place] services, hospital bed, possible hoyer lift and wheelchair(from previous chart it says pt has a wheelchair but pt states he doesn't).  If family cannot provide 24 hour care, SNF recommended.   Pt will benefit from skilled PT to increase their independence and safety with mobility to allow discharge to the venue listed below.      Follow Up Recommendations SNF;Supervision/Assistance - 24 hour    Equipment Recommendations  Hospital bed    Recommendations for Other Services       Precautions / Restrictions Precautions Precautions: Fall Precaution Comments: left chest tube Restrictions Weight Bearing Restrictions: No      Mobility  Bed Mobility Overal bed mobility: Needs Assistance Bed Mobility: Supine to Sit;Sit to Supine;Rolling Rolling: Max assist;+2 for physical assistance   Supine to sit: Max assist;+2 for physical assistance Sit to supine: Total assist;+2 for physical assistance   General bed mobility comments: Pt needed max assist as he does not initiate movement  but does assist a little.  Needed assist for LES and trunk with use of pad.   Transfers                 General transfer comment: unable  Ambulation/Gait             General Gait Details: unable  Stairs            Wheelchair Mobility    Modified Rankin (Stroke Patients Only)       Balance Overall balance assessment: Needs assistance Sitting-balance support: Bilateral upper extremity supported;No upper extremity supported;Feet supported Sitting balance-Leahy Scale: Zero Sitting balance - Comments: Pt needed mod to max asisst to sit EOB with decr use of UEs to assist.  Pt leaning posteriorly with posterior pelvic tilt. Could not lean forward and could not reach forward.  Also, tried to right neck as he leans to right.  Postural control: Posterior lean;Right lateral lean                                   Pertinent Vitals/Pain Pain Assessment: Faces Faces Pain Scale: Hurts even more Pain Location: chest tube site Pain Descriptors / Indicators: Aching;Grimacing;Guarding Pain Intervention(s): Limited activity within patient's tolerance;Monitored during session;Repositioned    Home Living Family/patient expects to be discharged to:: Private residence Living Arrangements: Children;Other relatives Available Help at Discharge: Personal care attendant;Family;Available 24 hours/day Type of Home: House Home Access: Ramped entrance     Home Layout: Multi-level;Able to live on main level with  bedroom/bathroom Home Equipment: Walker - 2 wheels;Wheelchair - manual;Cane - single point;Bedside commode;Walker - 4 wheels;Other (comment);Shower seat Additional Comments: has adjustable bed    Prior Function Level of Independence: Needs assistance   Gait / Transfers Assistance Needed: household ambulator with min assist  ADL's / Homemaking Assistance Needed: 24/7 coverage assisted by family and paid attendants 8-9 hours/day         Hand Dominance    Dominant Hand: Right    Extremity/Trunk Assessment   Upper Extremity Assessment Upper Extremity Assessment: Defer to OT evaluation    Lower Extremity Assessment Lower Extremity Assessment: RLE deficits/detail;LLE deficits/detail RLE Deficits / Details: grossly 2-5 LLE Deficits / Details: grossly 2-/5    Cervical / Trunk Assessment Cervical / Trunk Assessment: Kyphotic  Communication   Communication: No difficulties  Cognition Arousal/Alertness: Lethargic Behavior During Therapy: Flat affect Overall Cognitive Status: Impaired/Different from baseline Area of Impairment: Following commands;Safety/judgement;Problem solving                       Following Commands: Follows one step commands inconsistently;Follows one step commands with increased time Safety/Judgement: Decreased awareness of safety;Decreased awareness of deficits   Problem Solving: Difficulty sequencing;Slow processing;Decreased initiation;Requires verbal cues;Requires tactile cues        General Comments      Exercises     Assessment/Plan    PT Assessment Patient needs continued PT services  PT Problem List Decreased activity tolerance;Decreased balance;Decreased mobility;Decreased knowledge of use of DME;Decreased safety awareness;Decreased knowledge of precautions;Cardiopulmonary status limiting activity;Decreased strength;Decreased range of motion;Pain       PT Treatment Interventions DME instruction;Gait training;Functional mobility training;Therapeutic activities;Therapeutic exercise;Balance training;Patient/family education    PT Goals (Current goals can be found in the Care Plan section)  Acute Rehab PT Goals Patient Stated Goal: to go home per pt PT Goal Formulation: With patient Time For Goal Achievement: 09/13/2018 Potential to Achieve Goals: Good    Frequency Min 2X/week   Barriers to discharge        Co-evaluation               AM-PAC PT "6 Clicks" Mobility  Outcome  Measure Help needed turning from your back to your side while in a flat bed without using bedrails?: Total Help needed moving from lying on your back to sitting on the side of a flat bed without using bedrails?: Total Help needed moving to and from a bed to a chair (including a wheelchair)?: Total Help needed standing up from a chair using your arms (e.g., wheelchair or bedside chair)?: Total Help needed to walk in hospital room?: Total Help needed climbing 3-5 steps with a railing? : Total 6 Click Score: 6    End of Session Equipment Utilized During Treatment: Gait belt Activity Tolerance: Patient limited by fatigue Patient left: with call bell/phone within reach;in bed Nurse Communication: Mobility status PT Visit Diagnosis: Muscle weakness (generalized) (M62.81);Pain;Unsteadiness on feet (R26.81) Pain - part of body: (chest tube site)    Time: 1541-1600 PT Time Calculation (min) (ACUTE ONLY): 19 min   Charges:   PT Evaluation $PT Eval Moderate Complexity: [place] Pager:  [phone]  Office:  [place] 09/13/2018, 4:25 PM

## 2018-09-13 NOTE — Progress Notes (Addendum)
Progress Note  Patient Name: Jesse Cordova Date of Encounter: 09/13/2018  Primary Cardiologist: Verdie Shire  Subjective   Remains in NSR on IV amio. Minimal output from pericardial drain 25cc/24hr   Feels weak. Denies CP or SOB.    Inpatient Medications    Scheduled Meds:  insulin aspart  0-9 Units Subcutaneous Q4H   mouth rinse  15 mL Mouth Rinse BID    morphine injection  1 mg Intravenous Once   Continuous Infusions:  amiodarone 30 mg/hr (09/13/18 0800)   PRN Meds: acetaminophen, morphine injection, oxyCODONE   Vital Signs    Vitals:   09/13/18 0700 09/13/18 0800 09/13/18 0843 09/13/18 0900  BP: (!) 102/59 101/61  111/62  Pulse: 94 96  98  Resp: 13 17  19   Temp:   98.2 F (36.8 C)   TempSrc:   Oral   SpO2: 99% 98%  99%  Weight:      Height:        Intake/Output Summary (Last 24 hours) at 09/13/2018 0930 Last data filed at 09/13/2018 0830 Gross per 24 hour  Intake 381.16 ml  Output 870 ml  Net -488.84 ml   Last 3 Weights 09/10/2018 09/10/2018 08/14/2018  Weight (lbs) 150 lb 5.7 oz 150 lb 165 lb  Weight (kg) 68.2 kg 68.04 kg 74.844 kg      Telemetry    Sinus rhythm 90s - Personally Reviewed  ECG    None new- Personally Reviewed  Physical Exam   General:  Elderly. Weak and chronically-ill appearing. No resp difficulty HEENT: normal Neck: supple. no JVD. Carotids 2+ bilat; no bruits. No lymphadenopathy or thryomegaly appreciated. Cor: PMI nondisplaced. Regular rate & rhythm. No rubs, gallops or murmurs. + pericardial drain and CT Lungs: clear Abdomen: soft, nontender, nondistended. No hepatosplenomegaly. No bruits or masses. Good bowel sounds. Extremities: no cyanosis, clubbing, rash, edema Neuro: alert & orientedx3, cranial nerves grossly intact. moves all 4 extremities w/o difficulty. Affect pleasant    Labs    Chemistry Recent Labs  Lab 09/10/18 1030 09/10/18 1138 09/11/18 0752 09/12/18 0453  NA 134* 134* 136 134*  K 4.9  4.6 4.1 4.2  CL 102  --  104 106  CO2 17*  --  19* 16*  GLUCOSE 264*  --  110* 125*  BUN 43*  --  43* 43*  CREATININE 4.27*  --  3.29* 2.14*  CALCIUM 8.4*  --  8.2* 8.1*  GFRNONAA 12*  --  17* 29*  GFRAA 14*  --  20* 33*  ANIONGAP 15  --  13 12     Hematology Recent Labs  Lab 09/11/18 0752 09/11/18 1922 09/12/18 0453  WBC 11.6* 14.1* 10.9*  RBC 3.68* 3.75* 3.62*  HGB 10.7* 10.9* 11.1*  HCT 34.0* 34.7* 32.7*  MCV 92.4 92.5 90.3  MCH 29.1 29.1 30.7  MCHC 31.5 31.4 33.9  RDW 17.4* 17.5* 17.6*  PLT 161 169 119*    Cardiac Enzymes Recent Labs  Lab 09/10/18 0533  TROPONINI <0.03   No results for input(s): TROPIPOC in the last 168 hours.   BNP Recent Labs  Lab 09/10/18 0534  BNP 138.0*     DDimer No results for input(s): DDIMER in the last 168 hours.   Radiology    Dg Chest Port 1 View  Result Date: 09/13/2018 CLINICAL DATA:  Chest tube present. EXAM: PORTABLE CHEST 1 VIEW COMPARISON:  Chest x-ray from yesterday. FINDINGS: Unchanged left-sided chest tube and pericardial drain. Prior TAVR. Normal heart  size. Atherosclerotic calcification of the aortic arch. Normal pulmonary vascularity. Unchanged mild left lower lobe atelectasis. No focal consolidation, pleural effusion, or pneumothorax. No acute osseous abnormality. IMPRESSION: 1. Unchanged mild left lower lobe atelectasis. Electronically Signed   By: Titus Dubin M.D.   On: 09/13/2018 07:58   Dg Chest Port 1 View  Result Date: 09/12/2018 CLINICAL DATA:  Chest tube.  Pericardial drain EXAM: PORTABLE CHEST 1 VIEW COMPARISON:  09/11/2018 FINDINGS: Left chest tube remains in place. No pneumothorax. Pericardial drain remains in place unchanged in position. TAVR unchanged in position Mild left lower lobe atelectasis improved. Minimal left effusion. Right lung remains clear. IMPRESSION: Improvement in mild left lower lobe atelectasis. Minimal left effusion. No pneumothorax. Right lung remains clear. Electronically Signed    By: Franchot Gallo M.D.   On: 09/12/2018 07:44   Dg Chest Port 1 View  Result Date: 09/11/2018 CLINICAL DATA:  Pericardial drain.  Chest tube placement. EXAM: PORTABLE CHEST 1 VIEW COMPARISON:  Earlier today FINDINGS: A left chest tube has been placed. There is near complete resolution of the left pleural effusion. There is residual atelectasis at the left base. Pericardial drain remains in place. Right lung is clear. Normal heart size. IMPRESSION: Left chest tube placement with near complete resolution of the left pleural effusion. Electronically Signed   By: Marybelle Killings M.D.   On: 09/11/2018 09:56    Cardiac Studies   TTE 09/10/18  1. A 87mm an Wende Crease Sapien bioprosthetic aortic valve (TAVR) valve is present in the aortic position.  2. Limited echo for pericardial effusion/pericardiocentesis. Prior to procedure large circumferential effusion with RV diastolic collapse and tamponade. Pericardial space entrance confirmed by agitated saline. Post procedure trivial residual pericardial  effusion with persisting large left pleural effusion. RV diastolic collapse also gone.  Patient Profile     78 y.o. male with a history of type 2 diabetes, hypertension, hyperlipidemia, severe left ear status post TAVR as well as Merkel cell cancer getting chemo at [facility].  Presented to the hospital with respiratory distress found to have a large pericardial effusion/tamponade pericardiocentesis done emergency room with 120 cc of fluid removed  Assessment & Plan    1.  Pericardial effusion with tamponade physiology: - suspect malignant - s/p pericardiocentesis with drain placement on 4/10 (630 cc of bloody fluid) - fluid analysis is exudative - cytology still pending - drainage way down. Only 25cc/24 hours. Will clamp for 4 hours and if minimal build up will pull later.   2.  Merkel cell cancer, metastatic:  - Patient receiving chemotherapy at [facility].  Has had a mixed response to chemo.    3.  Acute  renal failure:  - Has a normal creatinine at baseline.  Creatinine peaked at 4.64. Likely due to ATN/tamponade. - Creatinine improved to 2.14 yesterday. No labs yet today - will order  4.  Bloody pleural effusion/Hemothorax: - Status post left chest tube by Dr. Servando Snare.  Plan per TCTS  5. PAF - new onset. Now back in NSR on amio - no AC with bloody effusions  - will switch to po amio 200 bid  6.  Hyperlipidemia: Continue lovastatin  7.  Type 2 diabetes: A1c 6.6.  Continue with current management.  8.  Status post TAVR: Continue SBE prophylaxis  9. Severe debility  - consult PT/OT  10. DVT prophylaxis - place SCDs. No heparin with blood effusions  11. Disposition  - Once pericardial drain out can go to the tele - Palliative  care consulting pending for [place]  For questions or updates, please contact [place] Please consult [email or web address] for contact info under        Signed, Glori Bickers, MD  09/13/2018, 9:30 AM    Addendum:  Pericardial drain pulled personally at bedside. No complications.   Discussed with Palliative Care at bedside as well.   Will transfer to the floor. Hopefully can pull chest tube in am per TCTS.   Glori Bickers, MD  3:11 PM

## 2018-09-13 NOTE — Progress Notes (Signed)
Pt arrived to 4e from 2h. Pt oriented to room and staff. Vitals obtained. Telemetry box applied and CCMD notified. Pt denies needs at this time. Left chest tube present to water seal. Will continue current plan of care.   Ara Kussmaul BSN, RN

## 2018-09-14 ENCOUNTER — Inpatient Hospital Stay (HOSPITAL_COMMUNITY): Payer: Medicare Other

## 2018-09-14 DIAGNOSIS — I480 Paroxysmal atrial fibrillation: Secondary | ICD-10-CM

## 2018-09-14 DIAGNOSIS — C801 Malignant (primary) neoplasm, unspecified: Secondary | ICD-10-CM

## 2018-09-14 DIAGNOSIS — I313 Pericardial effusion (noninflammatory): Secondary | ICD-10-CM

## 2018-09-14 LAB — GLUCOSE, CAPILLARY
Glucose-Capillary: 123 mg/dL — ABNORMAL HIGH (ref 70–99)
Glucose-Capillary: 136 mg/dL — ABNORMAL HIGH (ref 70–99)
Glucose-Capillary: 154 mg/dL — ABNORMAL HIGH (ref 70–99)
Glucose-Capillary: 168 mg/dL — ABNORMAL HIGH (ref 70–99)
Glucose-Capillary: 176 mg/dL — ABNORMAL HIGH (ref 70–99)
Glucose-Capillary: 185 mg/dL — ABNORMAL HIGH (ref 70–99)

## 2018-09-14 LAB — BASIC METABOLIC PANEL
Anion gap: 12 (ref 5–15)
BUN: 18 mg/dL (ref 8–23)
CO2: 18 mmol/L — ABNORMAL LOW (ref 22–32)
Calcium: 8.3 mg/dL — ABNORMAL LOW (ref 8.9–10.3)
Chloride: 103 mmol/L (ref 98–111)
Creatinine, Ser: 1.11 mg/dL (ref 0.61–1.24)
GFR calc Af Amer: >60 mL/min (ref >60)
GFR calc non Af Amer: >60 mL/min (ref >60)
Glucose, Bld: 175 mg/dL — ABNORMAL HIGH (ref 70–99)
Potassium: 3.8 mmol/L (ref 3.5–5.1)
Sodium: 133 mmol/L — ABNORMAL LOW (ref 135–145)

## 2018-09-14 MED ORDER — AMIODARONE HCL IN DEXTROSE 360-4.14 MG/200ML-% IV SOLN
30.0000 mg/h | INTRAVENOUS | Status: DC
  Administered 2018-09-14: 30 mg/h via INTRAVENOUS
  Filled 2018-09-14 (×4): qty 200

## 2018-09-14 MED ORDER — AMIODARONE HCL IN DEXTROSE 360-4.14 MG/200ML-% IV SOLN
60.0000 mg/h | INTRAVENOUS | Status: AC
  Administered 2018-09-14: 60 mg/h via INTRAVENOUS
  Filled 2018-09-14: qty 200

## 2018-09-14 NOTE — Progress Notes (Signed)
Progress Note  Patient Name: Jesse Cordova Date of Encounter: 09/14/2018  Primary Cardiologist: Skeet Latch, MD   Subjective   No significant events overnight.  The patient appears confused today and went to rapid A. fib around noon.  Inpatient Medications    Scheduled Meds: . amiodarone  200 mg Oral BID  . insulin aspart  0-9 Units Subcutaneous Q4H  . mouth rinse  15 mL Mouth Rinse BID  .  morphine injection  1 mg Intravenous Once   Continuous Infusions:  PRN Meds: acetaminophen, morphine injection, oxyCODONE   Vital Signs    Vitals:   09/13/18 2314 09/14/18 0039 09/14/18 0342 09/14/18 0700  BP: 95/60 105/66 (!) 100/57   Pulse: (!) 102 99 100 99  Resp: 15 11 12 11   Temp: 98.1 F (36.7 C) 97.6 F (36.4 C) 97.9 F (36.6 C)   TempSrc: Oral Oral Oral   SpO2: 99% 100% 100% 100%  Weight:      Height:        Intake/Output Summary (Last 24 hours) at 09/14/2018 0919 Last data filed at 09/14/2018 0412 Gross per 24 hour  Intake 304.69 ml  Output 353 ml  Net -48.31 ml   Filed Weights   09/10/18 0541 09/10/18 0900  Weight: 68 kg 68.2 kg    Telemetry    SR--> a-fib with RVR - Personally Reviewed  ECG    No new ECG tracing since 4/10. - Personally Reviewed  Physical Exam   Physical Exam per MD:  GEN: Caucasian male  resting comfortably. Alert and in no acute distress.   Neck: Supple. No JVD. Cardiac: RRR. No murmurs, gallops, or rubs.  Respiratory: Clear to auscultation bilaterally. No wheezes, rhonchi, or rales. GI: Abdomen soft, non-distended, and non-tender. Bowel sounds present. Extremities: No lower extremity edema. No deformity. Radial and distal pedal pulses 2+ and equal bilaterally. Skin: Warm and dry. Neuro:  No focal deficits. Psych: Normal affect. Responds appropriately.  Labs    Chemistry Recent Labs  Lab 09/11/18 0752 09/12/18 0453 09/13/18 1033  NA 136 134* 131*  K 4.1 4.2 4.0  CL 104 106 103  CO2 19* 16* 17*  GLUCOSE 110*  125* 156*  BUN 43* 43* 28*  CREATININE 3.29* 2.14* 1.15  CALCIUM 8.2* 8.1* 8.1*  GFRNONAA 17* 29* >60  GFRAA 20* 33* >60  ANIONGAP 13 12 11      Hematology Recent Labs  Lab 09/11/18 1922 09/12/18 0453 09/13/18 1033  WBC 14.1* 10.9* 10.0  RBC 3.75* 3.62* 3.74*  HGB 10.9* 11.1* 11.1*  HCT 34.7* 32.7* 33.9*  MCV 92.5 90.3 90.6  MCH 29.1 30.7 29.7  MCHC 31.4 33.9 32.7  RDW 17.5* 17.6* 17.2*  PLT 169 119* 159    Cardiac Enzymes Recent Labs  Lab 09/10/18 0533  TROPONINI <0.03   No results for input(s): TROPIPOC in the last 168 hours.   BNP Recent Labs  Lab 09/10/18 0534  BNP 138.0*     DDimer No results for input(s): DDIMER in the last 168 hours.   Radiology    Dg Chest Port 1 View  Result Date: 09/14/2018 CLINICAL DATA:  Left chest tube and pericardial effusion. EXAM: PORTABLE CHEST 1 VIEW COMPARISON:  09/13/2018 FINDINGS: Stable left chest tube without pneumothorax. Pericardial drain has been removed. Aortic valvuloplasty hardware is stable. Normal heart size. Lungs are under aerated with subsegmental atelectasis at the left base. Right lung is clear. IMPRESSION: Pericardial drain removed. Stable left chest tube without pneumothorax. Electronically Signed  By: Marybelle Killings M.D.   On: 09/14/2018 08:06   Dg Chest Port 1 View  Result Date: 09/13/2018 CLINICAL DATA:  Chest tube present. EXAM: PORTABLE CHEST 1 VIEW COMPARISON:  Chest x-ray from yesterday. FINDINGS: Unchanged left-sided chest tube and pericardial drain. Prior TAVR. Normal heart size. Atherosclerotic calcification of the aortic arch. Normal pulmonary vascularity. Unchanged mild left lower lobe atelectasis. No focal consolidation, pleural effusion, or pneumothorax. No acute osseous abnormality. IMPRESSION: 1. Unchanged mild left lower lobe atelectasis. Electronically Signed   By: Titus Dubin M.D.   On: 09/13/2018 07:58    Cardiac Studies   Echocardiogram 09/10/2018: 1. The left ventricle has normal  systolic function with an ejection fraction of 60-65%. The cavity size was normal. There is moderately increased left ventricular wall thickness. Left ventricular diastolic function could not be evaluated.  2. The right ventricle has normal systolic function. The cavity was normal. There is no increase in right ventricular wall thickness.  3. Left atrial size was not assessed.  4. Large pericardial effusion.  5. The pericardial effusion is circumferential.  6. Large circumferential effusion largest lateral to the LV free wall. Visually there is significant diastolic RV collapse, >45% respiratory variation in both TV/MV inflows and dilated IVC. Findings consitent with tamponade.  7. The mitral valve was not assessed.  8. The tricuspid valve is not assessed. Tricuspid valve regurgitation was not assessed by color flow Doppler.  9. The aortic valve was not assessed. Aortic valve regurgitation was not assessed by color flow Doppler. 10. The interatrial septum was not assessed.  Pericardiocentesis 09/10/2018:  Moderate to large free-flowing pericardial effusion -status post 150 mL emergency pericardiocentesis emergency room, now with additional 620-30 mL withdrawn.  Successful placement of pericardial drain using echocardiographic, ultrasound and fluoroscopic guidance.   Summary:  Successful pericardiocentesis draining 620 630 mL of bloody pericardial fluid.  Placement of pericardiocentesis needle with agitated saline study and echocardiogram prior to placement of drain.  Sizable left pleural effusion also noted on bedside echo.  Recommendations:  Return to CCU with ongoing care.  Pericardial drain care -placed on suction bag.  Determination of timing for removal of drain by rounding cardiologist.  Follow-up echocardiogram over the weekend.   Dr. Dorris Carnes was notified of the results.  She will contact patient's family to discuss results.  Patient Profile   Mr. Brobeck is a 78  y.o. male with a history of metastatic Merkel cell cancer treated at [facility] aortic stenosis s/p TAVR in 2009, hypertension, hyperlipidemia, diabetes mellitus, who presented to the ED on 09/10/2018 with acute respiratory distress and was found to have a large pericardial effusion with evidence of tamponade. A pericardiocentesis was performed in the ED with 120 cc of fluid removed.   Assessment & Plan    Pericardial Effusion with Tamponade Physiology - Patient s/p pericardiocentesis with drain placement on 4/10. Bloody fluid noted in drain.  Fluid analysis is exudative. Cytology is still pending. Suspect malignant. - Pericardial drain was pulled yesterday by Dr. Haroldine Laws. -CT surgery signed off on the patient  Bloody Pleural Effusion/Hemothorax - Patient s/p left chest tube by Dr. Servando Snare on 4/11. Being managed by TCTS. Plan is to remove chest tube this morning. Chest tube suture can be removed in 1 week.  New Onset Paroxysmal Atrial Fibrillation - Patient went into atrial fibrillation on 4/11 and was started on IV Amiodarone. He converted back to sinus rhythm later that day.  Today went back to rapid A. fib, we will switch  back to IV amiodarone without bolus. - No anticoagulation given blood pericardial and pleural effusions.    Acute Renal Failure - Patient has a normal creatinine at baseline. Creatinine peaked at 4.64 on 4/10. likely due to ATN/tamponade. - Creatinine improving and was 1.15 yesterday.  - Today's BMET pending.  Aortic Stenosis s/p TAVR in 2009 - Per Echo in 08/2018, aortic valve functioning properly. - Continue SBE prophylaxis.  Metastatic Merkel Cell Cancer - Patient receiving chemotherapy at [facility] and has reportedly had mixed response to chemo.  Hyperlipidemia - Continue Lovastatin.  Type 2 Diabetes Mellitus - Hemoglobin A1c 6.6. - Continue sliding scale insulin.  Severe Debility - PT and OT following. - Palliative care saw patient yesterday. Patient switched to  DNR/DNI and made comfort care. Patient reports that he thinks "that it is time to quit." He wants to go home and be with his granddaughter and great granddaughter. Palliative care is discussing with patient and family about transitioning home with support of hospice.  Will place care management consult potentially discharge tomorrow.  For questions or updates, please contact [place] Please consult [email or web address] for contact info under Cardiology/STEMI.   Signed, Ena Dawley, MD 09/14/2018, 9:19 AM   Pager: [phone]

## 2018-09-14 NOTE — Progress Notes (Signed)
Pt converted to to sinus rhythm. Rate of 100.

## 2018-09-14 NOTE — Consult Note (Signed)
Consultation Note Date: 09/14/2018   Patient Name: Jesse Cordova  DOB: 1941-03-07  MRN: [id number]  Age / Sex: 78 y.o., male  PCP: [name], MD Referring Physician: Fay Records, MD  Reason for Consultation: Establishing goals of care  HPI/Patient Profile: 78 y.o. male  with past medical history of hypertension, hyperlipidemia, aortic stenosis status post AVR in 2009 team, metastatic Merkel cell cancer cared for at [facility] admitted on 09/10/2018 with respiratory distress found to have pericardial effusion as well as pleural effusion.  He is s/p pericardial drain and chest tube.  He has been stating that he wants hospice per chart review.  Palliative consulted to [place].   Clinical Assessment and Goals of Care: I met today with Jesse Cordova.  He reports that the most important thing to him is his granddaughter and great granddaughter.  He states that he wants to go home and be with them.  We discussed his understanding of his situation.  He reports that the physicians have been doing a good job explaining things to him, but he reports being "too tired" when I asked him to tell me what they have been telling him.  I asked what his body is telling him, and he reported, "that it is time to quit."  I asked for clarification of this, and he reported that he thinks that he is going to die in the near future.  He clearly stated that he would not want heroic measures or to be put on life support.  Reports being tired and asked me to call his granddaughter.  I called and was able to reach his granddaughter, Jesse Cordova.  I introduced palliative care as specialized medical care for people living with serious illness. It focuses on providing relief from the symptoms and stress of a serious illness. The goal is to improve quality of life for both the patient and the family.  Jesse Cordova reports that [name] had been seeking treatment  for his cancer to remain functional enough to care for his wife, who passed in February.  She states that he has had continued decline in his nutrition and functional status.  Reports having long conversation with his PCP today and she understands his desire to forgo heroic interventions at end of life.  We discussed that in light of multiple chronic medical problems that have worsened with this acute admission, care should be focused on interventions that are likely to allow the patient to achieve goal of getting back to home and spending time with family. I discussed with his granddaughter regarding heroic interventions at the end-of-life and she agree this would not be in line with prior expressed wishes for a natural death or be likely to lead to getting well enough to go back home. She is in agreement with changing CODE STATUS to DO NOT RESUSCITATE.      Concept of Hospice and Palliative Care were discussed. Jesse Cordova reports that her grandmother died at home with the support of hospice and she is hopeful for  the same for her grandfather.  Questions and concerns addressed.   PMT will continue to support holistically.  SUMMARY OF RECOMMENDATIONS   - DNR/DNI - Granddaughter/HCPOA reports that patient has been talking more about wanting to focus on comfortable and spending time with family.  She believes that his wish would be to be medically maximized and then to transition home with the support of hospice.  She will discuss with him further, but he reports currently that he is too tired to talk. - Will ask member of PMT to follow up tomorrow.  Code Status/Advance Care Planning:  DNR   Symptom Management:   Pain: reports currently well controlled.  Continue same.  Palliative Prophylaxis:   Frequent Pain Assessment  Psycho-social/Spiritual:   Desire for further Chaplaincy support:no  Additional Recommendations: Education on Hospice  Prognosis:   < 6 months  Discharge Planning: ?  Home with hospice once medically maximized     Primary Diagnoses: Present on Admission: **None**   I have reviewed the medical record, interviewed the patient and family, and examined the patient. The following aspects are pertinent.  Past Medical History:  Diagnosis Date  . 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 Arthritis   . Cancer ([place])    skin cancer  . Chronic low back pain   . Complication of anesthesia   . Diabetes mellitus   . GERD (gastroesophageal reflux disease)   . History of hip surgery 06/02/2018   right hip, right knee  . Hyperlipemia   . Hypertension   . 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 PONV (postoperative nausea and vomiting)   . 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 Marital status: Widowed    Spouse name: Not on file  . Number of children: Not on file  . Years of education: Not on file  . Highest education level: Not on file  Occupational History  . Not on file  Social Needs  . Financial resource strain: Patient refused  . Food insecurity:    Worry: Patient refused    Inability: Patient refused  . Transportation needs:    Medical: Patient refused    Non-medical: Patient refused  Tobacco Use  . Smoking status: Never Smoker  . Smokeless tobacco: Never Used  . Tobacco comment: From spouse. Wife does not smoke around him now  Substance and Sexual Activity  . Alcohol use: No    Alcohol/week: 0.0 standard drinks  . Drug use: No  . Sexual activity: Not on file  Lifestyle  . Physical activity:    Days per week: Patient refused    Minutes per session: Patient refused  . Stress: Patient refused  Relationships  . Social connections:    Talks on phone: Patient refused    Gets  together: Patient refused    Attends religious service: Patient refused    Active member of club or organization: Patient refused    Attends meetings of clubs or organizations: Patient refused    Relationship status: Patient refused  Other Topics Concern  . Not on file  Social History Narrative  . Not on file   Family History  Problem Relation Age of Onset  . Diabetes Sister   . Heart disease Sister   . Atrial fibrillation  Sister   . Heart disease Other   . Diabetes Other   . Depression Maternal Grandfather    Scheduled Meds: . amiodarone  200 mg Oral BID  . insulin aspart  0-9 Units Subcutaneous Q4H  . mouth rinse  15 mL Mouth Rinse BID  .  morphine injection  1 mg Intravenous Once   Continuous Infusions: PRN Meds:.acetaminophen, morphine injection, oxyCODONE Medications Prior to Admission:  Prior to Admission medications   Medication Sig Start Date End Date Taking? Authorizing Provider  acetaminophen (TYLENOL) 500 MG tablet Take 500 mg by mouth every 4 (four) hours as needed for mild pain or moderate pain.    Yes [provider]  aspirin EC 81 MG tablet Take 81 mg by mouth daily.   Yes [provider]  calcium carbonate (CALCIUM 600) 600 MG TABS tablet Take 1 tablet by mouth daily.   Yes [provider]  Cholecalciferol (DIALYVITE VITAMIN D 5000) 125 MCG (5000 UT) capsule Take 5,000 Units by mouth daily.   Yes [provider]  denosumab (PROLIA) 60 MG/ML SOSY injection Inject 120 mg into the skin every 6 (six) months.    Yes [provider]  diphenhydrAMINE-APAP, sleep, (TYLENOL PM EXTRA STRENGTH PO) Take 1 tablet by mouth at bedtime.   Yes [provider]  docusate sodium (COLACE) 100 MG capsule Take 100 mg by mouth daily as needed for mild constipation.   Yes [provider]  ibuprofen (ADVIL,MOTRIN) 600 MG tablet Take 600 mg by mouth 2 (two) times daily.   Yes [provider]  insulin glargine (LANTUS)  100 UNIT/ML injection Inject 0.08 mLs (8 Units total) into the skin at bedtime. Patient taking differently: Inject 10 Units into the skin at bedtime. Under 200 cbg 0 units 07/23/18  Yes Emokpae, Courage, MD  INV-pembrolizumab LCCC 1525 (KEYTRUDA) 100 MG/4ML SOLN Inject 200 mg into the vein every 21 ( twenty-one) days.   Yes [provider]  lovastatin (MEVACOR) 20 MG tablet TAKE (1) TABLET BY MOUTH AT BEDTIME. Patient taking differently: Take 20 mg by mouth at bedtime.  08/19/18  Yes [name], MD  metoprolol succinate (TOPROL-XL) 25 MG 24 hr tablet Take 12.5 mg by mouth daily.   Yes [provider]  mirtazapine (REMERON) 15 MG tablet Take 15 mg by mouth at bedtime. 09/03/18  Yes [provider]  Multiple Vitamins-Minerals (SENTRY ADULT PO) Take 1 tablet by mouth daily.   Yes [provider]  ondansetron (ZOFRAN) 8 MG tablet Take 8 mg by mouth every 8 (eight) hours as needed for nausea or vomiting. Takes scheduled. If missed doses will be significant   Yes [provider]  Oxycodone HCl 10 MG TABS 1 every 4 hours as needed severe pain caution drowsiness metastatic cancer Patient taking differently: Take 10 mg by mouth every 4 (four) hours as needed (pain). 1 every 4 hours as needed severe pain caution drowsiness metastatic cancer 07/06/18  Yes Luking, Scott A, MD  pantoprazole (PROTONIX) 40 MG tablet Take 40 mg by mouth daily.   Yes [provider]  polyethylene glycol (MIRALAX / GLYCOLAX) packet Take 17 g by mouth 2 (two) times daily.   Yes [provider]  predniSONE (DELTASONE) 10 MG tablet Take 10 mg by mouth every morning.   Yes [provider]  prochlorperazine (COMPAZINE) 10 MG tablet Take 10 mg by mouth every 6 (six) hours as needed for nausea or vomiting.   Yes [provider]  benzonatate (  TESSALON) 200 MG capsule TAKE 1 CAPSULE BY MOUTH 3 TIMES A DAY AS NEEDED FOR COUGH Patient not taking: Reported on  09/10/2018 08/18/18   [name], MD  cefdinir (OMNICEF) 300 MG capsule Take 1 capsule (300 mg total) by mouth every 12 (twelve) hours. Patient not taking: Reported on 09/10/2018 08/17/18   TatShanon Brow, MD  feeding supplement, ENSURE ENLIVE, (ENSURE ENLIVE) LIQD Take 237 mLs by mouth 2 (two) times daily between meals. Patient taking differently: Take 237 mLs by mouth 3 (three) times daily with meals.  07/24/18   Roxan Hockey, MD  nystatin (MYCOSTATIN) 100000 UNIT/ML suspension One tsp swish and swallow four times per day for seven. Patient taking differently: Take 5 mLs by mouth 4 (four) times daily. One tsp swish and swallow four times per day for seven. 08/09/18   [name], MD   Allergies  Allergen Reactions  . Cortisone Other (See Comments)    swelling  . Tetanus Toxoids Other (See Comments)    Swelling, not specified  . Hydrocodone-Homatropine Nausea And Vomiting   Review of Systems  Constitutional: Positive for activity change and fatigue.  Musculoskeletal: Positive for back pain.  Neurological: Positive for weakness.  Psychiatric/Behavioral: Positive for sleep disturbance.   Physical Exam General: Elderly, chronically ill appearing, frail HEENT: No bruits, no goiter, no JVD Heart: Regular rate and rhythm. No murmur appreciated. Lungs: Decreased air movement, clear Abdomen: Soft, nontender, nondistended, positive bowel sounds.  Ext: No significant edema Skin: Warm and dry Neuro: Grossly intact, nonfocal.  Vital Signs: BP 95/60 (BP Location: Right Arm)   Pulse (!) 102   Temp 98.1 F (36.7 C) (Oral)   Resp 15   Ht _0  (1.854 m)   Wt 68.2 kg   SpO2 99%   BMI 19.84 kg/m  Pain Scale: 0-10 POSS *See Group Information*: 1-Acceptable,Awake and alert Pain Score: Asleep   SpO2: SpO2: 99 % O2 Device:SpO2: 99 % O2 Flow Rate: .O2 Flow Rate (L/min): 3 L/min  IO: Intake/output summary:   Intake/Output Summary (Last 24 hours) at 09/14/2018 0026 Last data filed at  09/13/2018 2000 Gross per 24 hour  Intake 557.9 ml  Output 718 ml  Net -160.1 ml    LBM: Last BM Date: 09/08/18 Baseline Weight: Weight: 68 kg Most recent weight: Weight: 68.2 kg     Palliative Assessment/Data:   Flowsheet Rows     Most Recent Value  Intake Tab  Referral Department  Cardiology  Unit at Time of Referral  ICU  Palliative Care Primary Diagnosis  Cardiac  Date Notified  09/12/18  Palliative Care Type  New Palliative care  Reason for referral  Clarify Goals of Care  Date of Admission  09/10/18  Date first seen by Palliative Care  09/13/18  # of days Palliative referral response time  1 Day(s)  # of days IP prior to Palliative referral  2  Clinical Assessment  Palliative Performance Scale Score  30%  Pain Max last 24 hours  8  Pain Min Last 24 hours  2  Psychosocial & Spiritual Assessment  Palliative Care Outcomes  Patient/Family meeting held?  Yes  Who was at the meeting?  Patient, granddaughter via phone      Time In: 1530 Time Out: 1650 Time Total: 80 min Greater than 50%  of this time was spent counseling and coordinating care related to the above assessment and plan.  Signed by: Micheline Rough, MD   Please contact Palliative Medicine Team phone at  [phone] for questions and concerns.  For individual provider: See Shea Evans

## 2018-09-14 NOTE — Progress Notes (Signed)
Left chest tube removed per order. Pt tolerated well. Gauze dressing applied. Suture intact. Will continue to monitor site.  Ara Kussmaul BSN, RN

## 2018-09-14 NOTE — TOC Progression Note (Addendum)
Transition of Care [facility]) - Progression Note    Patient Details  Name: Jesse Cordova MRN: [id number] Date of Birth: 24-Nov-1940  Transition of Care [facility] CM/SW [place], [place] Phone Number:[phone] 09/14/2018, 4:08 PM  Clinical Narrative:    Update: Jesse Cordova of [facility] returned call- they will contact family and update the CSW tomorrow.    CSW contacted patient's granddaughter, Jesse Cordova regarding patient going home with hospice. Jesse Cordova confirmed discharge plan and selected [facility] [phone]. CSW made referral to [facility] and was informed by receptionist their phone lines are down but she would pass the message to intake team and have them to return call to [place].        Expected Discharge Plan and Services                                     Social Determinants of Health (SDOH) Interventions    Readmission Risk Interventions No flowsheet data found.

## 2018-09-14 NOTE — Progress Notes (Signed)
Pt's HR currently down to afib 120s. Pt resting in bed. Will continue to monitor.

## 2018-09-14 NOTE — Progress Notes (Signed)
Daily Progress Note   Patient Name: Jesse Cordova       Date: 09/14/2018 DOB: [birth year]  Age: 78 y.o. MRN#: [id number] Attending Physician: Fay Records, MD Primary Care Physician: [name], MD Admit Date: 09/10/2018  Reason for Consultation/Follow-up: Establishing goals of care  Subjective: Patient appears weak,confused. He keeps saying "no" to all of my questions. He does ask about going home and why he is still here.   PPS 30%  See below.   Length of Stay: 4  Current Medications: Scheduled Meds:  . amiodarone  200 mg Oral BID  . insulin aspart  0-9 Units Subcutaneous Q4H  . mouth rinse  15 mL Mouth Rinse BID  .  morphine injection  1 mg Intravenous Once    Continuous Infusions:   PRN Meds: acetaminophen, morphine injection, oxyCODONE  Physical Exam         Frail elderly gentleman Resting in bed Confused Mild distress Does not appear dyspneic No edema Has muscle wasting Abdomen is not distended  Vital Signs: BP 105/61 (BP Location: Right Arm)   Pulse (!) 101   Temp 98 F (36.7 C) (Oral)   Resp 13   Ht 6\' 1"  (1.854 m)   Wt 68.2 kg   SpO2 100%   BMI 19.84 kg/m  SpO2: SpO2: 100 % O2 Device: O2 Device: Room Air O2 Flow Rate: O2 Flow Rate (L/min): 3 L/min  Intake/output summary:   Intake/Output Summary (Last 24 hours) at 09/14/2018 1418 Last data filed at 09/14/2018 0940 Gross per 24 hour  Intake -  Output 890 ml  Net -890 ml   LBM: Last BM Date: 09/09/18 Baseline Weight: Weight: 68 kg Most recent weight: Weight: 68.2 kg       Palliative Assessment/Data:    Flowsheet Rows     Most Recent Value  Intake Tab  Referral Department  Cardiology  Unit at Time of Referral  ICU  Palliative Care Primary Diagnosis  Cardiac  Date Notified   09/12/18  Palliative Care Type  New Palliative care  Reason for referral  Clarify Goals of Care  Date of Admission  09/10/18  Date first seen by Palliative Care  09/13/18  # of days Palliative referral response time  1 Day(s)  # of days IP prior to Palliative referral  2  Clinical Assessment  Palliative Performance Scale Score  30%  Pain Max last 24 hours  8  Pain Min Last 24 hours  2  Psychosocial & Spiritual Assessment  Palliative Care Outcomes  Patient/Family meeting held?  Yes  Who was at the meeting?  Patient, granddaughter via phone      Patient Active Problem List   Diagnosis Date Noted  . Pericardial effusion with cardiac tamponade 09/10/2018  . Pressure injury of skin 09/10/2018  . Acute lower UTI 08/16/2018  . Sepsis due to undetermined organism ([place]) 08/16/2018  . Febrile illness   . Fever 08/14/2018  . Sepsis ([place]) 07/20/2018  . CKD stage 2 due to type 2 diabetes mellitus ([place]) 07/06/2018  . Fracture of femoral neck, right, closed ([place]) 06/05/2018  . GERD (gastroesophageal reflux disease) 06/05/2018  . CAD (coronary artery disease) 06/05/2018  . Chest pain 06/05/2018  . 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 Severe aortic stenosis 12/22/2017  . Merkel cell carcinoma of face ([place]) 02/18/2016  . Essential hypertension, benign 11/15/2012  . Hyperlipidemia 11/15/2012  . Controlled diabetes mellitus type 2 with complications ([place]) 19/41/7408  . 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    Code Status:    Code Status Orders  (From  admission, onward)         Start     Ordered   09/13/18 1747  Do not attempt resuscitation (DNR)  Continuous    Question Answer Comment  In the event of cardiac or respiratory ARREST Do not call a "code blue"   In the event of cardiac or respiratory ARREST Do not perform Intubation, CPR, defibrillation or ACLS   In the event of cardiac or respiratory ARREST Use medication by any route, position, wound care, and other measures to relive pain and suffering. May use oxygen, suction and manual treatment of airway obstruction as needed for comfort.      09/13/18 1746        Code Status History    Date Active Date Inactive Code Status Order ID Comments User Context   08/14/2018 2232 08/17/2018 2147 Full Code [id number]  Phillips Grout, MD ED   07/20/2018 2049 07/23/2018 2340 Full Code [id number]  Bethena Roys, MD Inpatient   06/05/2018 0356 06/05/2018 1712 Full Code [id number]  Reubin Milan, MD ED   06/05/2018 0352 06/05/2018 0356 Full Code [id number]  Reubin Milan, MD ED   12/22/2017 1626 12/23/2017 2040 Full Code [id number]  Eileen Stanford, PA-C Inpatient   11/25/2017 0929 11/25/2017 1647 Full Code [id number]  Burnell Blanks, MD Inpatient   11/10/2011 0740 11/13/2011 1442 Full Code [id number]  Arna Medici, RN Inpatient       Prognosis:   guarded   Discharge Planning:  Home with Hospice  Care plan was discussed with  Patient, grand daughter [name] over the phone who states that she is the patient's HCPOA agent and that no information ought to be given out to [name] and [name].  Erline Levine states that she has HCPOA paperwork at home with her.  Thank you for allowing the Palliative Medicine Team to assist in the care of this patient.   Time In: 1300 Time Out: 1335 Total Time 35 Prolonged Time Billed  no       Greater than 50%  of this time was spent counseling and coordinating care related to the above assessment and plan.   Loistine Chance, MD [phone] Please contact Palliative Medicine Team phone at [phone] for questions and concerns.

## 2018-09-14 NOTE — Progress Notes (Signed)
Pt refusing insulin at this time. Pt was educated, but continues to refuse. Pt refusing to get up to the chair. Pt is also upset about DNR status and is concerned about being sent home with hospice. Pt was provided with education and emotional support. Granddaughter Erline Levine was called per pt's request and pt is speaking with her now. Will continue to monitor.

## 2018-09-14 NOTE — Progress Notes (Addendum)
4 Days Post-Op Procedure(s) (LRB): PERICARDIOCENTESIS (N/A) Subjective: Minimal drainage from L chest tube overnightl, CXR clear Patient c/o discomfort at tube site Patient now DNR with comfort care cytologyt on pericardial fluid still pending Will DC chest tube today- no plan for talc pleurodesis  Objective: Vital signs in last 24 hours: Temp:  [97.6 F (36.4 C)-98.5 F (36.9 C)] 97.9 F (36.6 C) (04/14 0342) Pulse Rate:  [94-102] 99 (04/14 0700) Cardiac Rhythm: Normal sinus rhythm (04/14 0700) Resp:  [10-19] 11 (04/14 0700) BP: (95-126)/(56-70) 100/57 (04/14 0342) SpO2:  [98 %-100 %] 100 % (04/14 0700)  Hemodynamic parameters for last 24 hours:     Intake/Output from previous day: 04/13 0701 - 04/14 0700 In: 457.9 [P.O.:360; I.V.:97.9] Out: 463 [Urine:300; Drains:3; Chest Tube:160] Intake/Output this shift: No intake/output data recorded.  Exam No air leak from chest tube Lungs clear  Lab Results: Recent Labs    09/12/18 0453 09/13/18 1033  WBC 10.9* 10.0  HGB 11.1* 11.1*  HCT 32.7* 33.9*  PLT 119* 159   BMET:  Recent Labs    09/12/18 0453 09/13/18 1033  NA 134* 131*  K 4.2 4.0  CL 106 103  CO2 16* 17*  GLUCOSE 125* 156*  BUN 43* 28*  CREATININE 2.14* 1.15  CALCIUM 8.1* 8.1*    PT/INR: No results for input(s): LABPROT, INR in the last 72 hours. 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

## 2018-09-14 NOTE — Progress Notes (Signed)
Called to room by nurse tech. Pt agitated with water pitcher in hand. Pt threatening to throw it on anyone who came near him. Pt had pulled condom cath off. Pt currently in afib RVR. PA notified. Pt on phone with his granddaughter. Per pt's granddaughter, he should not be receiving morphine because it "makes him crazy". Pt received morphine this am prior to chest tube removal. EKG done and in chart. Pt's heart rate 130s-140s. Will await orders from MD.

## 2018-09-15 DIAGNOSIS — I480 Paroxysmal atrial fibrillation: Secondary | ICD-10-CM

## 2018-09-15 LAB — CULTURE, BLOOD (SINGLE)
Culture: NO GROWTH
Special Requests: ADEQUATE

## 2018-09-15 LAB — CULTURE, BLOOD (ROUTINE X 2)
Culture: NO GROWTH
Culture: NO GROWTH
Special Requests: ADEQUATE
Special Requests: ADEQUATE

## 2018-09-15 LAB — CULTURE, BODY FLUID W GRAM STAIN -BOTTLE: Culture: NO GROWTH

## 2018-09-15 LAB — GLUCOSE, CAPILLARY
Glucose-Capillary: 153 mg/dL — ABNORMAL HIGH (ref 70–99)
Glucose-Capillary: 149 mg/dL — ABNORMAL HIGH (ref 70–99)
Glucose-Capillary: 154 mg/dL — ABNORMAL HIGH (ref 70–99)

## 2018-09-15 MED ORDER — METOPROLOL TARTRATE 25 MG PO TABS: 12.5000 mg | ORAL_TABLET | Freq: Two times a day (BID) | ORAL | 3 refills | Status: AC

## 2018-09-15 NOTE — Care Management Important Message (Signed)
Important Message  Patient Details  Name: Jesse Cordova MRN: [id number] Date of Birth: 1940-09-29   Medicare Important Message Given:  Yes    Orbie Pyo 09/15/2018, 2:54 PM

## 2018-09-15 NOTE — TOC Transition Note (Signed)
Transition of Care [facility]) - CM/SW Discharge Note   Patient Details  Name: Jesse Cordova MRN: [id number] Date of Birth: 1940-08-21  Transition of Care [facility]) CM/SW Contact:  [place], [place] Phone Number: [phone] 09/15/2018, 12:45 PM   Clinical Narrative:    Patient will DC to: Home with Hospice([place]) DC Date: 09/15/2018 Family Notified: Erline Levine, granddaughter  Transport By: Musician , transported by Para March DNR placed on chart for MD signature  RN, patient, and facility notified of DC. Discharge Summary sent to [facility] @ [phone].   Clinical Social Worker signing off. Thurmond Butts, MSW, [facility] Clinical Social Worker [phone]     Final next level of care: Home w Hospice Care Barriers to Discharge: No Barriers Identified   Patient Goals and CMS Choice   CMS Medicare.gov Compare Post Acute Care list provided to:: Patient Represenative (must comment)(Stacey, Granddaughter ) Choice offered to / list presented to : [facility] POA / Guardian(Stacey, granddaughter)  Discharge Placement                Patient to be transferred to facility by: car- granddaughter  Name of family member notified: Erline Levine, granddaughter  Patient and family notified of of transfer: 09/15/18  Discharge Plan and Services                DME Arranged: Other see comment(taken care of by [facility] ) DME Agency: Other - Comment([facility])       Social Determinants of Health (SDOH) Interventions     Readmission Risk Interventions No flowsheet data found.

## 2018-09-15 NOTE — Care Management Important Message (Signed)
Important Message  Patient Details  Name: Jesse Cordova MRN: [id number] Date of Birth: 1940-08-09   Medicare Important Message Given:  Yes    Keshawn Fiorito Montine Circle 09/15/2018, 2:50 PM

## 2018-09-15 NOTE — Discharge Summary (Signed)
Discharge Summary    Patient ID: Jesse Cordova MRN: [id number]; DOB: Apr 26, 1941  Admit date: 09/10/2018 Discharge date: 09/15/2018  Primary Care Provider: [name], MD  Primary Cardiologist: Skeet Latch, MD  Primary Electrophysiologist:  None   Discharge Diagnoses    Active Problems:   Malignant pericardial effusion (HCC)   Pressure injury of skin   Paroxysmal atrial fibrillation with RVR ([place])   Allergies Allergies  Allergen Reactions  . Cortisone Other (See Comments)    swelling  . Tetanus Toxoids Other (See Comments)    Swelling, not specified  . Hydrocodone-Homatropine Nausea And Vomiting    Diagnostic Studies/Procedures    Echocardiogram 09/10/2018: 1. The left ventricle has normal systolic function with an ejection fraction of 60-65%. The cavity size was normal. There is moderately increased left ventricular wall thickness. Left ventricular diastolic function could not be evaluated. 2. The right ventricle has normal systolic function. The cavity was normal. There is no increase in right ventricular wall thickness. 3. Left atrial size was not assessed. 4. Large pericardial effusion. 5. The pericardial effusion is circumferential. 6. Large circumferential effusion largest lateral to the LV free wall. Visually there is significant diastolic RV collapse, >25% respiratory variation in both TV/MV inflows and dilated IVC. Findings consitent with tamponade. 7. The mitral valve was not assessed. 8. The tricuspid valve is not assessed. Tricuspid valve regurgitation was not assessed by color flow Doppler. 9. The aortic valve was not assessed. Aortic valve regurgitation was not assessed by color flow Doppler. 10. The interatrial septum was not assessed.  Pericardiocentesis 09/10/2018:  Moderate to large free-flowing pericardial effusion -status post 150 mL emergency pericardiocentesis emergency room, now with additional 620-30 mL withdrawn.  Successful  placement of pericardial drain using echocardiographic, ultrasound and fluoroscopic guidance.  Summary:  Successful pericardiocentesis draining 620 630 mL of bloody pericardial fluid.  Placement of pericardiocentesis needle with agitated saline study and echocardiogram prior to placement of drain.  Sizable left pleural effusion also noted on bedside echo.    History of Present Illness     Jesse Cordova is a 78 y.o. 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   Pt presented to [facility] on 09/10/18 with respiratory distress and found to have a large pericardial effusion/tamponade. Pericardiocentesis was done in ED with 120cc fluid removal which resulted in immediate improvement in condition. The ED physician discussed the case with cardiothoracic surgery, Dr. Darcey Nora, who recommended transfer to [facility] Cardiac ICU for further management.  On arrival to [facility], pt noted chest tightness and dyspnea. Echo was done and showed significant residual fluid around the heart w/ evidence of tamponade. He was taken to the cath lab and had pericardiocentesis, done by Dr. Ellyn Hack. 630 cc of bloody fluid removed. Pericardial drain placed.  Fluid analysis c/w exudative effusion. Suspect malignant pericardial andreactivepleural effusions from metastatic Merkle Cell cancer. Chest tube ultimately removed by CT surgery. Recs are to remove tube suture in 1 week.   Pt remained hemodynamically stable. However he later developed afib w/ RVR and was treated w/ IV amiodarone. No anticoagulation due to bloody pericardial and pleural effusions. He converted to NSR w/ IV amiodarone. Amiodarone was discontinued and he was started on PO metoprolol 12.5 mg BID.   Hospital course was also notable for AKI w/ SCr peaking to 4.64, Likely due to ATN/tamponade. Cr improved by discharged and returned  to  baseline at 1.11.   Palliative care was also consulted to discuss goals of care. Palliative care discussed with family via phone. Decision was reached to change CODE status to DNR/DNI. Home hospice was also recommended and arranged by social work.   On 09/15/18, pt was seen and examined by Dr. Meda Coffee and felt stable for discharge to home hospice.   Hospital Course     Consultants: CT Surgery, Palliative Care    Discharge Vitals Blood pressure 119/64, pulse 85, temperature 99.3 F (37.4 C), temperature source Axillary, resp. rate 14, height 6\' 1"  (1.854 m), weight 68.2 kg, SpO2 95 %.  Filed Weights   09/10/18 0541 09/10/18 0900  Weight: 68 kg 68.2 kg    Labs & Radiologic Studies    CBC Recent Labs    09/13/18 1033  WBC 10.0  HGB 11.1*  HCT 33.9*  MCV 90.6  PLT 540   Basic Metabolic Panel Recent Labs    09/13/18 1033 09/14/18 0913  NA 131* 133*  K 4.0 3.8  CL 103 103  CO2 17* 18*  GLUCOSE 156* 175*  BUN 28* 18  CREATININE 1.15 1.11  CALCIUM 8.1* 8.3*   Liver Function Tests No results for input(s): AST, ALT, ALKPHOS, BILITOT, PROT, ALBUMIN in the last 72 hours. No results for input(s): LIPASE, AMYLASE in the last 72 hours. Cardiac Enzymes No results for input(s): CKTOTAL, CKMB, CKMBINDEX, TROPONINI in the last 72 hours. BNP Invalid input(s): POCBNP D-Dimer No results for input(s): DDIMER in the last 72 hours. Hemoglobin A1C No results for input(s): HGBA1C in the last 72 hours. Fasting Lipid Panel No results for input(s): CHOL, HDL, LDLCALC, TRIG, CHOLHDL, LDLDIRECT in the last 72 hours. Thyroid Function Tests No results for input(s): TSH, T4TOTAL, T3FREE, THYROIDAB in the last 72 hours.  Invalid input(s): FREET3 _____________  Dg Chest Port 1 View  Result Date: 09/14/2018 CLINICAL DATA:  Left chest tube and pericardial effusion. EXAM: PORTABLE CHEST 1 VIEW COMPARISON:  09/13/2018 FINDINGS: Stable left chest tube without pneumothorax. Pericardial drain has  been removed. Aortic valvuloplasty hardware is stable. Normal heart size. Lungs are under aerated with subsegmental atelectasis at the left base. Right lung is clear. IMPRESSION: Pericardial drain removed. Stable left chest tube without pneumothorax. Electronically Signed   By: Marybelle Killings M.D.   On: 09/14/2018 08:06   Dg Chest Port 1 View  Result Date: 09/13/2018 CLINICAL DATA:  Chest tube present. EXAM: PORTABLE CHEST 1 VIEW COMPARISON:  Chest x-ray from yesterday. FINDINGS: Unchanged left-sided chest tube and pericardial drain. Prior TAVR. Normal heart size. Atherosclerotic calcification of the aortic arch. Normal pulmonary vascularity. Unchanged mild left lower lobe atelectasis. No focal consolidation, pleural effusion, or pneumothorax. No acute osseous abnormality. IMPRESSION: 1. Unchanged mild left lower lobe atelectasis. Electronically Signed   By: Titus Dubin M.D.   On: 09/13/2018 07:58   Dg Chest Port 1 View  Result Date: 09/12/2018 CLINICAL DATA:  Chest tube.  Pericardial drain EXAM: PORTABLE CHEST 1 VIEW COMPARISON:  09/11/2018 FINDINGS: Left chest tube remains in place. No pneumothorax. Pericardial drain remains in place unchanged in position. TAVR unchanged in position Mild left lower lobe atelectasis improved. Minimal left effusion. Right lung remains clear. IMPRESSION: Improvement in mild left lower lobe atelectasis. Minimal left effusion. No pneumothorax. Right lung remains clear. Electronically Signed   By: Franchot Gallo M.D.   On: 09/12/2018 07:44   Dg Chest Port 1 View  Result Date: 09/11/2018 CLINICAL DATA:  Pericardial drain.  Chest  tube placement. EXAM: PORTABLE CHEST 1 VIEW COMPARISON:  Earlier today FINDINGS: A left chest tube has been placed. There is near complete resolution of the left pleural effusion. There is residual atelectasis at the left base. Pericardial drain remains in place. Right lung is clear. Normal heart size. IMPRESSION: Left chest tube placement with near  complete resolution of the left pleural effusion. Electronically Signed   By: Marybelle Killings M.D.   On: 09/11/2018 09:56   Dg Chest Port 1 View  Result Date: 09/11/2018 CLINICAL DATA:  Malignant pericardial effusion EXAM: PORTABLE CHEST 1 VIEW COMPARISON:  09/10/2018 FINDINGS: A pericardial drain projects over the mediastinum. Aortic valve replacement hardware is in place. A large left pleural effusion is not present. There is haziness throughout the left hemithorax with relative aeration in the left upper lobe. Right lung remains clear. No pneumothorax. IMPRESSION: Large left pleural effusion. Pericardial drain is in place. Electronically Signed   By: Marybelle Killings M.D.   On: 09/11/2018 09:56   Dg Chest Portable 1 View  Result Date: 09/10/2018 CLINICAL DATA:  Shortness of breath today. EXAM: PORTABLE CHEST 1 VIEW COMPARISON:  08/14/2018 FINDINGS: Cardiomegaly as seen previously. Aortic atherosclerosis. The right lung is clear. Question infiltrate or volume loss in the left lower lobe, probably with a small left effusion. Consider two-view chest radiography when able. IMPRESSION: Apparent development of a left effusion with left lower lobe infiltrate and/or atelectasis. Chronic cardiomegaly. Electronically Signed   By: Nelson Chimes M.D.   On: 09/10/2018 05:53   Disposition   Pt is being discharged home today in stable condition.  Follow-up Plans & [place]    Discharge Instructions    Diet - low sodium heart healthy   Complete by:  As directed       Discharge Medications   Allergies as of 09/15/2018      Reactions   Cortisone Other (See Comments)   swelling   Tetanus Toxoids Other (See Comments)   Swelling, not specified   Hydrocodone-homatropine Nausea And Vomiting      Medication List    STOP taking these medications   aspirin EC 81 MG tablet   benzonatate 200 MG capsule Commonly known as:  TESSALON   cefdinir 300 MG capsule Commonly known as:  OMNICEF    ibuprofen 600 MG tablet Commonly known as:  ADVIL,MOTRIN   metoprolol succinate 25 MG 24 hr tablet Commonly known as:  TOPROL-XL     TAKE these medications   acetaminophen 500 MG tablet Commonly known as:  TYLENOL Take 500 mg by mouth every 4 (four) hours as needed for mild pain or moderate pain.   Calcium 600 600 MG Tabs tablet Generic drug:  calcium carbonate Take 1 tablet by mouth daily.   denosumab 60 MG/ML Sosy injection Commonly known as:  PROLIA Inject 120 mg into the skin every 6 (six) months.   Dialyvite Vitamin D 5000 125 MCG (5000 UT) capsule Generic drug:  Cholecalciferol Take 5,000 Units by mouth daily.   docusate sodium 100 MG capsule Commonly known as:  COLACE Take 100 mg by mouth daily as needed for mild constipation.   feeding supplement (ENSURE ENLIVE) Liqd Take 237 mLs by mouth 2 (two) times daily between meals. What changed:  when to take this   insulin glargine 100 UNIT/ML injection Commonly known as:  Lantus Inject 0.08 mLs (8 Units total) into the skin at bedtime. What changed:    how much to take  additional  instructions   INV-pembrolizumab LCCC 1525 100 MG/4ML Soln Commonly known as:  KEYTRUDA Inject 200 mg into the vein every 21 ( twenty-one) days.   lovastatin 20 MG tablet Commonly known as:  MEVACOR TAKE (1) TABLET BY MOUTH AT BEDTIME. What changed:  See the new instructions.   metoprolol tartrate 25 MG tablet Commonly known as:  LOPRESSOR Take 0.5 tablets (12.5 mg total) by mouth 2 (two) times daily.   mirtazapine 15 MG tablet Commonly known as:  REMERON Take 15 mg by mouth at bedtime.   nystatin 100000 UNIT/ML suspension Commonly known as:  MYCOSTATIN One tsp swish and swallow four times per day for seven. What changed:    how much to take  how to take this  when to take this   ondansetron 8 MG tablet Commonly known as:  ZOFRAN Take 8 mg by mouth every 8 (eight) hours as needed for nausea or vomiting. Takes  scheduled. If missed doses will be significant   Oxycodone HCl 10 MG Tabs 1 every 4 hours as needed severe pain caution drowsiness metastatic cancer What changed:    how much to take  how to take this  when to take this  reasons to take this   pantoprazole 40 MG tablet Commonly known as:  PROTONIX Take 40 mg by mouth daily.   polyethylene glycol 17 g packet Commonly known as:  MIRALAX / GLYCOLAX Take 17 g by mouth 2 (two) times daily.   predniSONE 10 MG tablet Commonly known as:  DELTASONE Take 10 mg by mouth every morning.   prochlorperazine 10 MG tablet Commonly known as:  COMPAZINE Take 10 mg by mouth every 6 (six) hours as needed for nausea or vomiting.   SENTRY ADULT PO Take 1 tablet by mouth daily.   TYLENOL PM EXTRA STRENGTH PO Take 1 tablet by mouth at bedtime.        Acute coronary syndrome (MI, NSTEMI, STEMI, etc) this admission?: No.    Outstanding Labs/Studies   None   Duration of Discharge Encounter   Greater than 30 minutes including physician time.  Signed, Lyda Jester, PA-C 09/15/2018, 11:50 AM

## 2018-09-15 NOTE — Plan of Care (Addendum)
Care plan has been reviewed:   Problem: Clinical Measurements: Cardiac and hemodynamic instability due to having an episode of Atrial fibrillation with RVR today in the afternoon. Goal: Will achieve and/or maintain hemodynamic stability.Cardiovascular complication will be avoided       Outcome: Progressing: HR 90s-100s , normal sinus rhythm on monitor. BP remains stable. Continue Amiodarone IV drip. Continue to monitor.   Problem: Pt has been non compliant with care, confused  and combative. He had low appetite, refused to eat per RN day shift reported. Goal: Adequate nutrition will be maintained Outcome: Not progressing, monitor blood glucose q 4 hrs, no signs of hypoglycemia. Pt refused Insulin and checking blood glucose at midnight. Will continue to monitor.  Problem: Pain Managment:  Goal: General experience of comfort will improve Outcome: Progressing: Pt's  sleeping well comfortably, no pain.  Kennyth Lose, BSN,RN,PCCN-CMC,CSC

## 2018-09-15 NOTE — Progress Notes (Signed)
Progress Note  Patient Name: Jesse Cordova Date of Encounter: 09/15/2018  Primary Cardiologist: Jesse Latch, MD   Subjective   The patient somnolent, but answers questions. Denies any major complains.  Inpatient Medications    Scheduled Meds: . insulin aspart  0-9 Units Subcutaneous Q4H  . mouth rinse  15 mL Mouth Rinse BID   Continuous Infusions: . amiodarone 30 mg/hr (09/14/18 2125)   PRN Meds: acetaminophen, oxyCODONE   Vital Signs    Vitals:   09/14/18 2100 09/15/18 0011 09/15/18 0415 09/15/18 0733  BP:  (!) 115/42 117/71 119/64  Pulse: 95  86 85  Resp: 13  14   Temp:  97.8 F (36.6 C) 97.6 F (36.4 C) 99.3 F (37.4 C)  TempSrc:  Oral Oral Axillary  SpO2: 97% 99% 99% 95%  Weight:      Height:        Intake/Output Summary (Last 24 hours) at 09/15/2018 0856 Last data filed at 09/15/2018 0600 Gross per 24 hour  Intake 310.42 ml  Output 975 ml  Net -664.58 ml   Last 3 Weights 09/10/2018 09/10/2018 08/14/2018  Weight (lbs) 150 lb 5.7 oz 150 lb 165 lb  Weight (kg) 68.2 kg 68.04 kg 74.844 kg      Telemetry    SR - Personally Reviewed  ECG    09/14/18 Afib w/ RVR 135 bpm - Personally Reviewed  Physical Exam   GEN: No acute distress.   Neck: No JVD Cardiac: RRR, no murmurs, rubs, or gallops.  Respiratory: Clear to auscultation bilaterally. GI: Soft, nontender, non-distended  MS: No edema; No deformity. Neuro:  Nonfocal  Psych: Normal affect   Labs    Chemistry Recent Labs  Lab 09/12/18 0453 09/13/18 1033 09/14/18 0913  NA 134* 131* 133*  K 4.2 4.0 3.8  CL 106 103 103  CO2 16* 17* 18*  GLUCOSE 125* 156* 175*  BUN 43* 28* 18  CREATININE 2.14* 1.15 1.11  CALCIUM 8.1* 8.1* 8.3*  GFRNONAA 29* >60 >60  GFRAA 33* >60 >60  ANIONGAP 12 11 12      Hematology Recent Labs  Lab 09/11/18 1922 09/12/18 0453 09/13/18 1033  WBC 14.1* 10.9* 10.0  RBC 3.75* 3.62* 3.74*  HGB 10.9* 11.1* 11.1*  HCT 34.7* 32.7* 33.9*  MCV 92.5 90.3 90.6   MCH 29.1 30.7 29.7  MCHC 31.4 33.9 32.7  RDW 17.5* 17.6* 17.2*  PLT 169 119* 159    Cardiac Enzymes Recent Labs  Lab 09/10/18 0533  TROPONINI <0.03   No results for input(s): TROPIPOC in the last 168 hours.   BNP Recent Labs  Lab 09/10/18 0534  BNP 138.0*     DDimer No results for input(s): DDIMER in the last 168 hours.   Radiology    Dg Chest Port 1 View  Result Date: 09/14/2018 CLINICAL DATA:  Left chest tube and pericardial effusion. EXAM: PORTABLE CHEST 1 VIEW COMPARISON:  09/13/2018 FINDINGS: Stable left chest tube without pneumothorax. Pericardial drain has been removed. Aortic valvuloplasty hardware is stable. Normal heart size. Lungs are under aerated with subsegmental atelectasis at the left base. Right lung is clear. IMPRESSION: Pericardial drain removed. Stable left chest tube without pneumothorax. Electronically Signed   By: Jesse Cordova M.D.   On: 09/14/2018 08:06    Cardiac Studies   Echocardiogram 09/10/2018: 1. The left ventricle has normal systolic function with an ejection fraction of 60-65%. The cavity size was normal. There is moderately increased left ventricular wall thickness. Left ventricular diastolic  function could not be evaluated. 2. The right ventricle has normal systolic function. The cavity was normal. There is no increase in right ventricular wall thickness. 3. Left atrial size was not assessed. 4. Large pericardial effusion. 5. The pericardial effusion is circumferential. 6. Large circumferential effusion largest lateral to the LV free wall. Visually there is significant diastolic RV collapse, >99% respiratory variation in both TV/MV inflows and dilated IVC. Findings consitent with tamponade. 7. The mitral valve was not assessed. 8. The tricuspid valve is not assessed. Tricuspid valve regurgitation was not assessed by color flow Doppler. 9. The aortic valve was not assessed. Aortic valve regurgitation was not assessed by color flow  Doppler. 10. The interatrial septum was not assessed.  Pericardiocentesis 09/10/2018:  Moderate to large free-flowing pericardial effusion -status post 150 mL emergency pericardiocentesis emergency room, now with additional 620-30 mL withdrawn.  Successful placement of pericardial drain using echocardiographic, ultrasound and fluoroscopic guidance.  Summary:  Successful pericardiocentesis draining 620 630 mL of bloody pericardial fluid.  Placement of pericardiocentesis needle with agitated saline study and echocardiogram prior to placement of drain.  Sizable left pleural effusion also noted on bedside echo.  Recommendations:  Return to CCU with ongoing care.  Pericardial drain care -placed on suction bag.  Determination of timing for removal of drain by rounding cardiologist.  Follow-up echocardiogram over the weekend.   Dr. Dorris Cordova was notified of the results. She will contact patient's family to discuss results.   Patient Profile     Mr. Jesse Cordova is a 78 y.o. male with a history of metastatic Merkel cell cancer treated at [facility] aortic stenosis s/p TAVR in 2009, hypertension, hyperlipidemia, diabetes mellitus, who presented to the ED on 09/10/2018 with acute respiratory distress and was found to have a large pericardial effusion with evidence of tamponade. A pericardiocentesis was performed in the ED with 120 cc of fluid removed. Hospital course further complicated by atrial fibrillation w/ RVR.   Assessment & Plan    1. Pericardial Effusion with Tamponade Physiology - Patient s/p pericardiocentesis with drain placement on 4/10. Bloody fluid noted in drain.  Fluid analysis is exudative. Suspect malignant pericardial and reactive pleural effusions from metastatic Merkle Cell cancer. Pericardial drain discontinued. CT surgery signed off on the patient  2. Bloody Pleural Effusion/Hemothorax - Patient s/p left chest tube by Dr. Servando Cordova on 4/11. Tubes removed. Chest tube  suture can be removed in 1 week, per CT surgery.   3. New Onset Paroxysmal Atrial Fibrillation - Patient went into atrial fibrillation on 4/11 and was started on IV Amiodarone. MD to review tele. - he cardioverted back to SR, I would d/c amio and start metoprolol 12.5 mg po BID - No anticoagulation given blood pericardial and pleural effusions.    4. Acute Renal Failure - Patient has a normal creatinine at baseline. Creatinine peaked at 4.64 on 4/10. Likely due to ATN/tamponade. - Creatinine improving and was 1.11 yesterday.  - Today's BMET pending.  5. Aortic Stenosis s/p TAVR in 2009 - Per Echo in 08/2018, aortic valve functioning properly. - Continue SBE prophylaxis.  6. Metastatic Merkel Cell Cancer - Patient receiving chemotherapy at [facility] and has reportedly had mixed response to chemo.  7. Hyperlipidemia - Continue Lovastatin.  8. Type 2 Diabetes Mellitus - Hemoglobin A1c 6.6. - Continue sliding scale insulin.  9. Severe Debility - PT and OT following. - Palliative care has seen the pt. Patient switched to DNR/DNI and made comfort care. Patient reports that he thinks "that  it is time to quit." He wants to go home and be with his granddaughter and great granddaughter. Palliative care is discussing with patient and family about transitioning home with support of hospice.  Awaiting response from [facility].  We will start arrangements for home hospice, anticipated discharge later today.    For questions or updates, please contact [place] Please consult [email or web address] for contact info under   Signed, Lyda Jester, PA-C  09/15/2018, 8:56 AM

## 2018-09-15 NOTE — Progress Notes (Signed)
PT Cancellation Note  Patient Details Name: Jesse Cordova MRN: [id number] DOB: 06-09-1940   Cancelled Treatment:    Reason Eval/Treat Not Completed: Other (comment)(Nurse states pt is going home with Hospice.)   Denice Paradise 09/15/2018, 12:45 PM  Chee Dimon,PT Acute Rehabilitation Services Pager:  [phone]  Office:  [phone]

## 2018-09-20 ENCOUNTER — Telehealth: Payer: Self-pay | Admitting: Family Medicine

## 2018-09-20 NOTE — Telephone Encounter (Signed)
JGO:[id number] grand daughter due to receiving a fax from [facility] for refill on Oxycodone 10 mg/ ml suspension. Take 0.5 ml by mouth every 2 hours prn. Pt grand daughter states that he is having a hard time swallowing pills so the hospice nurse switched the pills to liquid. Pt grand daughter states that patient does not want to take it but hospice nurse stated that with his bone cancer he would need something for pain so grand daughter is giving the oxycodone suspension to him. Pt grand daughter states that she has this fill on 09/16/2018 so she does not need a refill.

## 2018-09-21 ENCOUNTER — Ambulatory Visit: Payer: Medicare Other | Admitting: Family Medicine

## 2018-09-21 ENCOUNTER — Other Ambulatory Visit: Payer: Self-pay

## 2018-10-01 DEATH — deceased

## 2019-04-04 IMAGING — CT CT ANGIO CHEST
1 of 6 series · 1 of 28 positions shown · non-contrast
Comparison: Chest CT 11/10/2011. CT the abdomen and pelvis
11/19/2011.

CLINICAL DATA: 77-year-old male with history of severe aortic
stenosis. Preprocedural study prior to potential transcatheter
aortic valve replacement (TAVR).

EXAM:
CT CHEST, ABDOMEN AND PELVIS WITHOUT CONTRAST
TECHNIQUE: Multidetector CT imaging of the chest, abdomen and pelvis was
performed following the standard protocol without IV contrast.

[Series 282: — · 0.40mm/px · 1 of 7 slices shown]
[im 4/7]
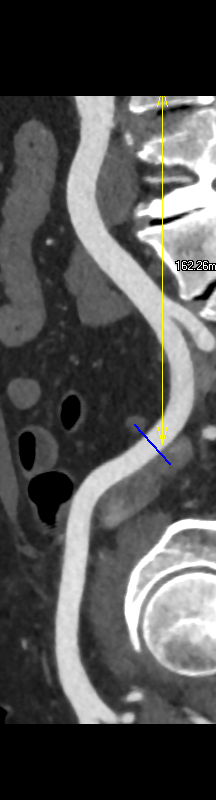

[1 of 28 positions shown; findings below may reference images not displayed]

FINDINGS: CTA CHEST FINDINGS

Cardiovascular: Heart size is normal. There is no significant
pericardial fluid, thickening or pericardial calcification. There is
aortic atherosclerosis, as well as atherosclerosis of the great
vessels of the mediastinum and the coronary arteries, including
calcified atherosclerotic plaque in the left main, left anterior
descending and right coronary arteries. Thickening calcification of
the aortic valve. Mild mitral annular calcification.

Mediastinum/Lymph Nodes: No pathologically enlarged mediastinal or
hilar lymph nodes. Esophagus is unremarkable in appearance. No
axillary lymphadenopathy.

Lungs/Pleura: 5 x 3 mm nodule (mean diameter 4 mm) in the anterior
aspect of the right lower lobe abutting the major fissure (axial
image 40 of series 15), unchanged dating back to 1095, considered
definitively benign, presumably a subpleural lymph node. No other
suspicious appearing pulmonary nodules or masses are noted. No acute
consolidative airspace disease. No pleural effusions.

Musculoskeletal/Soft Tissues: There are no aggressive appearing
lytic or blastic lesions noted in the visualized portions of the
skeleton.

CTA ABDOMEN AND PELVIS FINDINGS

Hepatobiliary: No suspicious cystic or solid hepatic lesions. No
intra or extrahepatic biliary ductal dilatation. Gallbladder is
normal in appearance.

Pancreas: No pancreatic mass. No pancreatic ductal dilatation. No
pancreatic or peripancreatic fluid or inflammatory changes.

Spleen: Unremarkable.

Adrenals/Urinary Tract: Bilateral kidneys and bilateral adrenal
glands are normal in appearance. No hydronephrosis. There is mild
left hydroureter. No definite calculus identified along the course
of the left ureter. Urinary bladder is normal in appearance.

Stomach/Bowel: Normal appearance of the stomach. No pathologic
dilatation of small bowel or colon. Normal appendix.

Vascular/Lymphatic: Aortic atherosclerosis with vascular findings
and measurements pertinent to potential TAVR procedure, as detailed
below. No aneurysm or dissection noted in the abdominal or pelvic
vasculature. Celiac axis, superior mesenteric artery and inferior
mesenteric artery are all widely patent without without
hemodynamically significant stenosis. Two right and single (with
early bifurcation) renal arteries are all widely patent without
hemodynamically significant stenosis. No lymphadenopathy noted in
the abdomen or pelvis.

Reproductive: Prostate gland and seminal vesicles are unremarkable
in appearance.

Other: No significant volume of ascites.  No pneumoperitoneum.

Musculoskeletal: There are no aggressive appearing lytic or blastic
lesions noted in the visualized portions of the skeleton.

VASCULAR MEASUREMENTS PERTINENT TO TAVR:

AORTA:

Minimal Aortic Xiameter-YX x 15 mm

Severity of Aortic Calcification-mild

RIGHT PELVIS:

Right Common Iliac Artery -

Minimal Ciameter-DD.V x 10.5 mm

Tortuosity-mild-to-moderate

Calcification-none

Right External Iliac Artery -

Minimal Tiameter-X.G x 9.3 mm

Tortuosity-moderate

Calcification - none

Right Common Femoral Artery -

Minimal Giameter-6.J x 8.5 mm

Tortuosity-mild

Calcification - none

LEFT PELVIS:

Left Common Iliac Artery -

Minimal Iiameter-OE.H x 10.6 mm

Tortuosity-moderate

Calcification - none

Left External Iliac Artery -

Minimal Jiameter-5.A x 8.5 mm

Tortuosity-moderate to severe

Calcification - none

Left Common Femoral Artery -

Minimal 9iameter-I.Y x 8.9 mm

Tortuosity-mild

Calcification - none

Review of the MIP images confirms the above findings.
IMPRESSION: 1. Vascular findings and measurements pertinent to potential TAVR
procedure, as detailed above.
2. Severe thickening calcification of the aortic valve, compatible
with the reported clinical history of severe aortic stenosis.
3. Aortic atherosclerosis, in addition to left main and 2 vessel
coronary artery disease. Please note that although the presence of
coronary artery calcium documents the presence of coronary artery
disease, the severity of this disease and any potential stenosis
cannot be assessed on this non-gated CT examination. Assessment for
potential risk factor modification, dietary therapy or pharmacologic
therapy may be warranted, if clinically indicated.
4. Additional incidental findings, as above.

Aortic Atherosclerosis ([id number]).
